# Patient Record
Sex: Female | Born: 1935 | Race: White | Hispanic: No | Smoking: Former smoker
Health system: Southern US, Community
[De-identification: ages and names within clinical notes are randomized; demographics above are authoritative.]

## PROBLEM LIST (undated history)

## (undated) DIAGNOSIS — E785 Hyperlipidemia, unspecified: Secondary | ICD-10-CM

## (undated) DIAGNOSIS — I10 Essential (primary) hypertension: Secondary | ICD-10-CM

## (undated) DIAGNOSIS — E079 Disorder of thyroid, unspecified: Secondary | ICD-10-CM

## (undated) DIAGNOSIS — K5790 Diverticulosis of intestine, part unspecified, without perforation or abscess without bleeding: Secondary | ICD-10-CM

## (undated) DIAGNOSIS — F329 Major depressive disorder, single episode, unspecified: Secondary | ICD-10-CM

## (undated) DIAGNOSIS — M810 Age-related osteoporosis without current pathological fracture: Secondary | ICD-10-CM

## (undated) DIAGNOSIS — I872 Venous insufficiency (chronic) (peripheral): Secondary | ICD-10-CM

## (undated) DIAGNOSIS — F32A Depression, unspecified: Secondary | ICD-10-CM

## (undated) DIAGNOSIS — K219 Gastro-esophageal reflux disease without esophagitis: Secondary | ICD-10-CM

## (undated) DIAGNOSIS — H269 Unspecified cataract: Secondary | ICD-10-CM

## (undated) HISTORY — DX: Depression, unspecified: F32.A

## (undated) HISTORY — PX: OTHER SURGICAL HISTORY: SHX169

## (undated) HISTORY — PX: TUBAL LIGATION: SHX77

## (undated) HISTORY — DX: Age-related osteoporosis without current pathological fracture: M81.0

## (undated) HISTORY — PX: BREAST SURGERY: SHX581

## (undated) HISTORY — PX: ABDOMINAL HYSTERECTOMY: SHX81

## (undated) HISTORY — PX: CHOLECYSTECTOMY: SHX55

## (undated) HISTORY — PX: CATARACT EXTRACTION: SUR2

## (undated) HISTORY — DX: Unspecified cataract: H26.9

## (undated) HISTORY — DX: Diverticulosis of intestine, part unspecified, without perforation or abscess without bleeding: K57.90

## (undated) HISTORY — PX: APPENDECTOMY: SHX54

## (undated) HISTORY — DX: Essential (primary) hypertension: I10

## (undated) HISTORY — DX: Major depressive disorder, single episode, unspecified: F32.9

## (undated) HISTORY — DX: Hyperlipidemia, unspecified: E78.5

## (undated) HISTORY — PX: TONSILECTOMY/ADENOIDECTOMY WITH MYRINGOTOMY: SHX6125

## (undated) HISTORY — DX: Venous insufficiency (chronic) (peripheral): I87.2

## (undated) HISTORY — DX: Gastro-esophageal reflux disease without esophagitis: K21.9

## (undated) HISTORY — DX: Disorder of thyroid, unspecified: E07.9

---

## 2010-07-01 DEATH — deceased

## 2013-12-31 ENCOUNTER — Encounter: Payer: Self-pay | Admitting: *Deleted

## 2014-01-01 ENCOUNTER — Non-Acute Institutional Stay (SKILLED_NURSING_FACILITY): Payer: Medicare Other | Admitting: Internal Medicine

## 2014-01-01 DIAGNOSIS — F329 Major depressive disorder, single episode, unspecified: Secondary | ICD-10-CM

## 2014-01-01 DIAGNOSIS — S2239XA Fracture of one rib, unspecified side, initial encounter for closed fracture: Secondary | ICD-10-CM

## 2014-01-01 DIAGNOSIS — F3289 Other specified depressive episodes: Secondary | ICD-10-CM

## 2014-01-01 DIAGNOSIS — S2231XA Fracture of one rib, right side, initial encounter for closed fracture: Secondary | ICD-10-CM

## 2014-01-01 DIAGNOSIS — I10 Essential (primary) hypertension: Secondary | ICD-10-CM

## 2014-01-01 DIAGNOSIS — K219 Gastro-esophageal reflux disease without esophagitis: Secondary | ICD-10-CM

## 2014-01-01 DIAGNOSIS — Z96649 Presence of unspecified artificial hip joint: Secondary | ICD-10-CM

## 2014-01-01 DIAGNOSIS — F32A Depression, unspecified: Secondary | ICD-10-CM

## 2014-01-02 ENCOUNTER — Encounter: Payer: Self-pay | Admitting: Internal Medicine

## 2014-01-02 DIAGNOSIS — I10 Essential (primary) hypertension: Secondary | ICD-10-CM | POA: Insufficient documentation

## 2014-01-02 DIAGNOSIS — Z96649 Presence of unspecified artificial hip joint: Secondary | ICD-10-CM | POA: Insufficient documentation

## 2014-01-02 DIAGNOSIS — K219 Gastro-esophageal reflux disease without esophagitis: Secondary | ICD-10-CM | POA: Insufficient documentation

## 2014-01-02 DIAGNOSIS — F32A Depression, unspecified: Secondary | ICD-10-CM | POA: Insufficient documentation

## 2014-01-02 DIAGNOSIS — S2231XA Fracture of one rib, right side, initial encounter for closed fracture: Secondary | ICD-10-CM | POA: Insufficient documentation

## 2014-01-02 DIAGNOSIS — F329 Major depressive disorder, single episode, unspecified: Secondary | ICD-10-CM | POA: Insufficient documentation

## 2014-01-02 NOTE — Assessment & Plan Note (Signed)
Tramadol written for her at SNF [facility]

## 2014-01-02 NOTE — Assessment & Plan Note (Addendum)
Pt was not written for any pain meds so I wrote for her tramadol 50 mg q 6 ;PROPHYLAXIS WITH SEQUENTIAL COMPRESSION DEVICES

## 2014-01-02 NOTE — Assessment & Plan Note (Signed)
Continue Lexapro 5 mg daily.

## 2014-01-02 NOTE — Assessment & Plan Note (Signed)
H/o but BP is fine on no meds

## 2014-01-02 NOTE — Assessment & Plan Note (Signed)
Continue omeprazole 

## 2014-01-02 NOTE — Progress Notes (Signed)
MRN: [id number] Name: Ashley Swanson  Sex: female Age: 80 y.o. DOB: 08-Nov-1928  [facility] #: [facility] Facility/Room: 507 Level Of Care: SNF Provider: Merrilee SeashoreALEXANDER, Jazmyne Beauchesne D Emergency Contacts: Extended Emergency Contact Information Primary Emergency Contact: [facility],[name] Address: [address]          [place], [place] [id number] [place] Home Phone: [phone] Mobile Phone: [phone] Relation: Daughter Secondary Emergency Contact: Tera PartridgeBolling,[name]          [place], [place] Home Phone: [phone] Mobile Phone: [phone] Relation: Son  Code Status: FULL  Allergies: Ciprofloxacin; Enablex; Macrobid; Metronidazole and related; Reclast; and Sulfa antibiotics  Chief Complaint  Patient presents with  . nursing home admission    HPI: Patient is 80 y.o. female who is s/p hip fx and rib fx who is admitted for OT/PT.  Past Medical History  Diagnosis Date  . Hyperlipidemia   . Osteoporosis   . Stasis dermatitis   . Diverticulosis   . Cataract   . Thyroid disease     hypothyroid  . GERD (gastroesophageal reflux disease)   . Hypertension   . Depression     Past Surgical History  Procedure Laterality Date  . Cholecystectomy    . Abdominal hysterectomy    . Tubal ligation    . Breast surgery      biopsy  . Tonsilectomy/adenoidectomy with myringotomy    . Cataract extraction    . Appendectomy    . Vertebrolplasty        Medication List       This list is accurate as of: 01/01/14 11:59 PM.  Always use your most recent med list.               amoxicillin 500 MG tablet  Commonly known as:  AMOXIL  Take 500 mg by mouth daily.     brimonidine 0.1 % Soln  Commonly known as:  ALPHAGAN P  Place 1 drop into the right eye 2 (two) times daily.     Cholecalciferol 1000 UNITS tablet  Take 1,000 Units by mouth daily.     dorzolamide 2 % ophthalmic solution  Commonly known as:  TRUSOPT  Place 1 drop into both eyes 2 (two) times daily.     escitalopram 5 MG tablet  Commonly known as:  LEXAPRO  Take 2.5-5 mg by mouth at bedtime.     omeprazole 40 MG capsule  Commonly known as:  PRILOSEC  Take 40 mg by mouth daily.     prednisoLONE acetate 1 % ophthalmic suspension  Commonly known as:  PRED FORTE  Place 1 drop into the right eye 4 (four) times daily.        Meds ordered this encounter  Medications  . dorzolamide (TRUSOPT) 2 % ophthalmic solution    Sig: Place 1 drop into both eyes 2 (two) times daily.     There is no immunization history on file for this patient.  History  Substance Use Topics  . Smoking status: Former Games developermoker  . Smokeless tobacco: Not on file  . Alcohol Use: Yes    Family history is noncontributory    Review of Systems  DATA OBTAINED: from patient GENERAL: Feels well no fevers, fatigue, appetite changes SKIN: No itching, rash or wounds EYES: No eye pain, redness, discharge EARS: No earache, tinnitus, change in hearing NOSE: No congestion, drainage or bleeding  MOUTH/THROAT: No mouth or tooth pain, No sore throat RESPIRATORY: No cough, wheezing, SOB CARDIAC: No chest pain, palpitations,  lower extremity edema  GI: No abdominal pain, No N/V/D or constipation, No heartburn or reflux  GU: No dysuria, frequency or urgency, or incontinence  MUSCULOSKELETAL:c/o rib pain;says that tramadol makes her sleep but she had one 7 hours ago and didn't sleep  NEUROLOGIC: No headache, dizziness or focal weakness PSYCHIATRIC: No overt anxiety or sadness. Sleeps well. No behavior issue.   Filed Vitals:   01/02/14 2220  BP: 108/59  Pulse: 62  Temp: 96.4 F (35.8 C)  Resp: 18    Physical Exam  GENERAL APPEARANCE: Alert, conversant. Appropriately groomed; looks a little uncomfortable  SKIN: No diaphoresis rash HEAD: Normocephalic, atraumatic  EYES: Conjunctiva/lids clear. Pupils round, reactive. EOMs intact.  EARS: External exam WNL, canals clear. Hearing grossly normal.  NOSE: No deformity  or discharge.  MOUTH/THROAT: Lips w/o lesions RESPIRATORY: Breathing is even, unlabored, no splinting Lung sounds are clear   CARDIOVASCULAR: Heart RRR no murmurs, rubs or gallops. No peripheral edema  GASTROINTESTINAL: Abdomen is soft, non-tender, not distended w/ normal bowel sounds GENITOURINARY: Bladder non tender, not distended  MUSCULOSKELETAL: No abnormal joints or musculature NEUROLOGIC: Oriented X3. Cranial nerves 2-12 grossly intact. Moves all extremities no tremor. PSYCHIATRIC: Mood and affect appropriate to situation, no behavioral issues  Patient Active Problem List   Diagnosis Date Noted  . S/P total hip arthroplasty 01/02/2014  . Right rib fracture 01/02/2014  . GERD (gastroesophageal reflux disease)   . Hypertension   . Depression     CBC   3/30   WBC 9.0  7.7/23.7PLT 172   CMP 3/29  142, 3.9, 104, 30, 108, 15/0.68  Ca 8.2  GFR 79   Assessment and Plan  S/P total hip arthroplasty Pt was not written for any pain meds so I wrote for her tramadol 50 mg q 6 ;PROPHYLAXIS WITH SEQUENTIAL COMPRESSION DEVICES  Right rib fracture Tramadol written for her at SNF as none was written for her at [facility].  Hypertension H/o but BP is fine on no meds  GERD (gastroesophageal reflux disease) Continue omeprazole  Depression Continue Lexapro 5 mg daily    Margit Hanks, MD

## 2014-03-12 ENCOUNTER — Encounter: Payer: Self-pay | Admitting: Adult Health

## 2014-10-05 NOTE — Progress Notes (Signed)
This encounter was created in error - please disregard.

## 2015-08-06 ENCOUNTER — Emergency Department (HOSPITAL_COMMUNITY): Payer: Medicare Other

## 2015-08-06 ENCOUNTER — Encounter (HOSPITAL_COMMUNITY): Payer: Self-pay | Admitting: Emergency Medicine

## 2015-08-06 ENCOUNTER — Observation Stay (HOSPITAL_COMMUNITY)
Admission: EM | Admit: 2015-08-06 | Discharge: 2015-08-10 | Disposition: A | Discharge status: Skilled Nursing Facility | Payer: Medicare Other | Attending: Internal Medicine | Admitting: Internal Medicine

## 2015-08-06 DIAGNOSIS — S22060A Wedge compression fracture of T7-T8 vertebra, initial encounter for closed fracture: Secondary | ICD-10-CM | POA: Diagnosis not present

## 2015-08-06 DIAGNOSIS — M810 Age-related osteoporosis without current pathological fracture: Secondary | ICD-10-CM | POA: Insufficient documentation

## 2015-08-06 DIAGNOSIS — E039 Hypothyroidism, unspecified: Secondary | ICD-10-CM | POA: Diagnosis not present

## 2015-08-06 DIAGNOSIS — Z881 Allergy status to other antibiotic agents status: Secondary | ICD-10-CM | POA: Insufficient documentation

## 2015-08-06 DIAGNOSIS — M8580 Other specified disorders of bone density and structure, unspecified site: Secondary | ICD-10-CM | POA: Diagnosis not present

## 2015-08-06 DIAGNOSIS — Y92091 Bathroom in other non-institutional residence as the place of occurrence of the external cause: Secondary | ICD-10-CM | POA: Insufficient documentation

## 2015-08-06 DIAGNOSIS — F329 Major depressive disorder, single episode, unspecified: Secondary | ICD-10-CM | POA: Insufficient documentation

## 2015-08-06 DIAGNOSIS — I6523 Occlusion and stenosis of bilateral carotid arteries: Secondary | ICD-10-CM | POA: Insufficient documentation

## 2015-08-06 DIAGNOSIS — W010XXA Fall on same level from slipping, tripping and stumbling without subsequent striking against object, initial encounter: Secondary | ICD-10-CM | POA: Diagnosis not present

## 2015-08-06 DIAGNOSIS — Z888 Allergy status to other drugs, medicaments and biological substances status: Secondary | ICD-10-CM | POA: Insufficient documentation

## 2015-08-06 DIAGNOSIS — S22000A Wedge compression fracture of unspecified thoracic vertebra, initial encounter for closed fracture: Secondary | ICD-10-CM | POA: Diagnosis present

## 2015-08-06 DIAGNOSIS — E785 Hyperlipidemia, unspecified: Secondary | ICD-10-CM | POA: Diagnosis not present

## 2015-08-06 DIAGNOSIS — Y93E1 Activity, personal bathing and showering: Secondary | ICD-10-CM | POA: Diagnosis not present

## 2015-08-06 DIAGNOSIS — K219 Gastro-esophageal reflux disease without esophagitis: Secondary | ICD-10-CM | POA: Diagnosis not present

## 2015-08-06 DIAGNOSIS — S22080A Wedge compression fracture of T11-T12 vertebra, initial encounter for closed fracture: Secondary | ICD-10-CM | POA: Diagnosis not present

## 2015-08-06 DIAGNOSIS — Z882 Allergy status to sulfonamides status: Secondary | ICD-10-CM | POA: Insufficient documentation

## 2015-08-06 DIAGNOSIS — I10 Essential (primary) hypertension: Secondary | ICD-10-CM | POA: Diagnosis not present

## 2015-08-06 DIAGNOSIS — Z87891 Personal history of nicotine dependence: Secondary | ICD-10-CM | POA: Diagnosis not present

## 2015-08-06 DIAGNOSIS — E860 Dehydration: Secondary | ICD-10-CM | POA: Insufficient documentation

## 2015-08-06 DIAGNOSIS — Y998 Other external cause status: Secondary | ICD-10-CM | POA: Insufficient documentation

## 2015-08-06 DIAGNOSIS — F32A Depression, unspecified: Secondary | ICD-10-CM | POA: Diagnosis present

## 2015-08-06 DIAGNOSIS — S22070A Wedge compression fracture of T9-T10 vertebra, initial encounter for closed fracture: Secondary | ICD-10-CM | POA: Diagnosis not present

## 2015-08-06 DIAGNOSIS — R937 Abnormal findings on diagnostic imaging of other parts of musculoskeletal system: Secondary | ICD-10-CM

## 2015-08-06 LAB — CBC WITH DIFFERENTIAL/PLATELET
Basophils Absolute: 0 10*3/uL (ref 0.0–0.1)
Basophils Relative: 0 %
Eosinophils Absolute: 0.2 10*3/uL (ref 0.0–0.7)
Eosinophils Relative: 2 %
HCT: 41.7 % (ref 36.0–46.0)
Hemoglobin: 12.9 g/dL (ref 12.0–15.0)
Lymphocytes Relative: 19 %
Lymphs Abs: 1.6 10*3/uL (ref 0.7–4.0)
MCH: 29.8 pg (ref 26.0–34.0)
MCHC: 30.9 g/dL (ref 30.0–36.0)
MCV: 96.3 fL (ref 78.0–100.0)
Monocytes Absolute: 1 10*3/uL (ref 0.1–1.0)
Monocytes Relative: 12 %
Neutro Abs: 5.7 10*3/uL (ref 1.7–7.7)
Neutrophils Relative %: 67 %
Platelets: 192 10*3/uL (ref 150–400)
RBC: 4.33 MIL/uL (ref 3.87–5.11)
RDW: 13.7 % (ref 11.5–15.5)
WBC: 8.5 10*3/uL (ref 4.0–10.5)

## 2015-08-06 LAB — BASIC METABOLIC PANEL
Anion gap: 5 (ref 5–15)
BUN: 18 mg/dL (ref 6–20)
CO2: 31 mmol/L (ref 22–32)
Calcium: 9.1 mg/dL (ref 8.9–10.3)
Chloride: 107 mmol/L (ref 101–111)
Creatinine, Ser: 0.81 mg/dL (ref 0.44–1.00)
GFR calc Af Amer: >60 mL/min (ref >60)
GFR calc non Af Amer: >60 mL/min (ref >60)
Glucose, Bld: 107 mg/dL — ABNORMAL HIGH (ref 65–99)
Potassium: 3.7 mmol/L (ref 3.5–5.1)
Sodium: 143 mmol/L (ref 135–145)

## 2015-08-06 MED ORDER — RESOURCE THICKENUP CLEAR PO POWD
ORAL | Status: DC | PRN
  Administered 2015-08-06: 21:00:00 via ORAL
  Filled 2015-08-06 (×2): qty 125

## 2015-08-06 MED ORDER — ONDANSETRON HCL 4 MG/2ML IJ SOLN
4.0000 mg | Freq: Four times a day (QID) | INTRAMUSCULAR | Status: DC | PRN
  Administered 2015-08-07: 4 mg via INTRAVENOUS
  Filled 2015-08-06: qty 2

## 2015-08-06 MED ORDER — KETOROLAC TROMETHAMINE 15 MG/ML IJ SOLN
15.0000 mg | Freq: Once | INTRAMUSCULAR | Status: AC
  Administered 2015-08-07: 15 mg via INTRAVENOUS
  Filled 2015-08-06: qty 1

## 2015-08-06 MED ORDER — HYDROMORPHONE HCL 1 MG/ML IJ SOLN: 1.0000 mg | Freq: Once | INTRAMUSCULAR | Status: DC

## 2015-08-06 MED ORDER — MORPHINE SULFATE (PF) 4 MG/ML IV SOLN
4.0000 mg | Freq: Once | INTRAVENOUS | Status: AC
  Administered 2015-08-06: 4 mg via INTRAVENOUS
  Filled 2015-08-06: qty 1

## 2015-08-06 MED ORDER — FAMOTIDINE IN NACL 20-0.9 MG/50ML-% IV SOLN
20.0000 mg | Freq: Once | INTRAVENOUS | Status: AC
  Administered 2015-08-07: 20 mg via INTRAVENOUS
  Filled 2015-08-06: qty 50

## 2015-08-06 MED ORDER — OXYCODONE-ACETAMINOPHEN 5-325 MG PO TABS
1.0000 | ORAL_TABLET | Freq: Once | ORAL | Status: AC
  Administered 2015-08-06: 1 via ORAL
  Filled 2015-08-06: qty 1

## 2015-08-06 NOTE — ED Notes (Signed)
[facility] last night around 7pm. Pain 10/10 today upon palpation to mid thoracic.

## 2015-08-06 NOTE — ED Notes (Signed)
Patient is resting comfortably. 

## 2015-08-06 NOTE — ED Provider Notes (Signed)
CSN: [id number]     Arrival date & time 08/06/15  1606 History   First MD Initiated Contact with Patient 08/06/15 1618     Chief Complaint  Patient presents with  . Fall  . Back Pain     (Consider location/radiation/quality/duration/timing/severity/associated sxs/prior Treatment) HPI Ashley Swanson is an 81 yo female who presents to the Emergency Department for evaluation of a fall last night. [facility] She states she was in the bathroom last night when she lost balance with her walker and fell backwards on her back and hit her head. She denies loss of consciousness. She notes pain "all over." Endorses headache, back pain, rib pain, shortness of breath, headache. Pt states she is incontinent, but this is not new to her. Denies fever.   Past Medical History  Diagnosis Date  . Hyperlipidemia   . Osteoporosis   . Stasis dermatitis   . Diverticulosis   . Cataract   . Thyroid disease     hypothyroid  . GERD (gastroesophageal reflux disease)   . Hypertension   . Depression    Past Surgical History  Procedure Laterality Date  . Cholecystectomy    . Abdominal hysterectomy    . Tubal ligation    . Breast surgery      biopsy  . Tonsilectomy/adenoidectomy with myringotomy    . Cataract extraction    . Appendectomy    . Vertebrolplasty     Family History  Problem Relation Age of Onset  . Aortic aneurysm Mother   . Cancer Father     mouth  . Diabetes Neg Hx   . Coronary artery disease Neg Hx    Social History  Substance Use Topics  . Smoking status: Former Games developermoker  . Smokeless tobacco: None  . Alcohol Use: Yes   OB History    No data available     Review of Systems  All other systems negative except as documented in the HPI. All pertinent positives and negatives as reviewed in the HPI.  Allergies  Ciprofloxacin; Enablex; Macrobid; Metronidazole and related; Reclast; and Sulfa antibiotics  Home Medications   Prior to Admission medications     Medication Sig Start Date End Date Taking? Authorizing Provider  amoxicillin (AMOXIL) 500 MG tablet Take 500 mg by mouth daily.    Historical Provider, MD  brimonidine (ALPHAGAN P) 0.1 % SOLN Place 1 drop into the right eye 2 (two) times daily.    Historical Provider, MD  Cholecalciferol 1000 UNITS tablet Take 1,000 Units by mouth daily.    Historical Provider, MD  dorzolamide (TRUSOPT) 2 % ophthalmic solution Place 1 drop into both eyes 2 (two) times daily.    Historical Provider, MD  escitalopram (LEXAPRO) 5 MG tablet Take 2.5-5 mg by mouth at bedtime.    Historical Provider, MD  omeprazole (PRILOSEC) 40 MG capsule Take 40 mg by mouth daily.    Historical Provider, MD  prednisoLONE acetate (PRED FORTE) 1 % ophthalmic suspension Place 1 drop into the right eye 4 (four) times daily.    Historical Provider, MD   BP 177/76 mmHg  Pulse 69  Temp(Src) 97.7 F (36.5 C) (Oral)  Resp 16  SpO2 100% Physical Exam  Constitutional:  81 yo female, lying in bed and appears to be in mild discomfort.  HENT:  Head:    Hematoma noted on right occipital lobe with tenderness to palpation.  Eyes: Conjunctivae are normal. Pupils are equal, round, and reactive to light.  Neck:  Limited range of motion.  Cardiovascular: Normal rate and regular rhythm.  Exam reveals no gallop and no friction rub.   No murmur heard. Pulmonary/Chest: Effort normal and breath sounds normal. No respiratory distress. She has no wheezes. She has no rales.  Abdominal: Soft. Bowel sounds are normal. She exhibits no distension and no mass. There is no tenderness. There is no rebound and no guarding.  Musculoskeletal: She exhibits tenderness.       Thoracic back: She exhibits decreased range of motion and tenderness.  Neurological: She is alert.  Skin: Skin is warm and dry.    ED Course  Procedures (including critical care time) Labs Review Labs Reviewed  BASIC METABOLIC PANEL - Abnormal; Notable for the following:     Glucose, Bld 107 (*)    All other components within normal limits  BASIC METABOLIC PANEL - Abnormal; Notable for the following:    BUN 24 (*)    Calcium 8.8 (*)    All other components within normal limits  BASIC METABOLIC PANEL - Abnormal; Notable for the following:    Glucose, Bld 103 (*)    Calcium 8.6 (*)    Anion gap 4 (*)    All other components within normal limits  BASIC METABOLIC PANEL - Abnormal; Notable for the following:    Calcium 8.7 (*)    All other components within normal limits  CBC WITH DIFFERENTIAL/PLATELET  CBC  CBC    Imaging Review No results found. I have personally reviewed and evaluated these images and lab results as part of my medical decision-making.   EKG Interpretation None      6:25PM Pt states she continues to be in pain. No improvement.  9:05PM No change in pain. Pain med is currently being administered by nurse.  Patient is admitted to the hospital for further evaluation and care.   Charlestine Night, PA-C 08/12/15 0865  Lyndal Pulley, MD 08/12/15 [phone]

## 2015-08-06 NOTE — ED Notes (Signed)
Bed: [id number] Expected date: 08/06/15 Expected time: 4:02 PM Means of arrival: Ambulance Comments: Fall

## 2015-08-06 NOTE — ED Notes (Signed)
Patient transported to CT 

## 2015-08-07 DIAGNOSIS — E039 Hypothyroidism, unspecified: Secondary | ICD-10-CM | POA: Insufficient documentation

## 2015-08-07 DIAGNOSIS — S22000A Wedge compression fracture of unspecified thoracic vertebra, initial encounter for closed fracture: Secondary | ICD-10-CM | POA: Diagnosis not present

## 2015-08-07 MED ORDER — ALBUTEROL SULFATE (2.5 MG/3ML) 0.083% IN NEBU: 2.5000 mg | INHALATION_SOLUTION | RESPIRATORY_TRACT | Status: DC | PRN

## 2015-08-07 MED ORDER — MORPHINE SULFATE (PF) 2 MG/ML IV SOLN: 2.0000 mg | INTRAVENOUS | Status: DC | PRN

## 2015-08-07 MED ORDER — ENOXAPARIN SODIUM 40 MG/0.4ML ~~LOC~~ SOLN
40.0000 mg | SUBCUTANEOUS | Status: DC
  Administered 2015-08-07 – 2015-08-10 (×4): 40 mg via SUBCUTANEOUS
  Filled 2015-08-07 (×4): qty 0.4

## 2015-08-07 MED ORDER — PREDNISOLONE ACETATE 1 % OP SUSP
1.0000 [drp] | Freq: Four times a day (QID) | OPHTHALMIC | Status: DC
  Administered 2015-08-07 – 2015-08-10 (×15): 1 [drp] via OPHTHALMIC
  Filled 2015-08-07: qty 1

## 2015-08-07 MED ORDER — CALCIUM CARBONATE 1250 (500 CA) MG PO TABS
1.0000 | ORAL_TABLET | Freq: Every day | ORAL | Status: DC
  Administered 2015-08-07 – 2015-08-10 (×4): 500 mg via ORAL
  Filled 2015-08-07 (×6): qty 1

## 2015-08-07 MED ORDER — MELATONIN 10 MG PO CAPS: 10.0000 mg | ORAL_CAPSULE | Freq: Every day | ORAL | Status: DC

## 2015-08-07 MED ORDER — ALUM & MAG HYDROXIDE-SIMETH 200-200-20 MG/5ML PO SUSP: 15.0000 mL | ORAL | Status: DC | PRN

## 2015-08-07 MED ORDER — VITAMIN B-12 1000 MCG PO TABS
1000.0000 ug | ORAL_TABLET | Freq: Every day | ORAL | Status: DC
  Administered 2015-08-07 – 2015-08-10 (×4): 1000 ug via ORAL
  Filled 2015-08-07 (×4): qty 1

## 2015-08-07 MED ORDER — HYDROCODONE-ACETAMINOPHEN 5-325 MG PO TABS: 1.0000 | ORAL_TABLET | Freq: Four times a day (QID) | ORAL | Status: DC | PRN

## 2015-08-07 MED ORDER — ACETAMINOPHEN 325 MG PO TABS: 650.0000 mg | ORAL_TABLET | Freq: Four times a day (QID) | ORAL | Status: DC | PRN

## 2015-08-07 MED ORDER — POTASSIUM CHLORIDE IN NACL 20-0.45 MEQ/L-% IV SOLN
INTRAVENOUS | Status: DC
  Administered 2015-08-07: 16:00:00 via INTRAVENOUS
  Administered 2015-08-07: 50 mL/h via INTRAVENOUS
  Administered 2015-08-08 – 2015-08-10 (×3): via INTRAVENOUS
  Filled 2015-08-07 (×7): qty 1000

## 2015-08-07 MED ORDER — BISACODYL 10 MG RE SUPP: 10.0000 mg | RECTAL | Status: DC | PRN

## 2015-08-07 MED ORDER — SENNOSIDES-DOCUSATE SODIUM 8.6-50 MG PO TABS
1.0000 | ORAL_TABLET | Freq: Two times a day (BID) | ORAL | Status: DC
  Administered 2015-08-07 – 2015-08-10 (×7): 1 via ORAL
  Filled 2015-08-07 (×9): qty 1

## 2015-08-07 MED ORDER — ESCITALOPRAM OXALATE 20 MG PO TABS
40.0000 mg | ORAL_TABLET | Freq: Every day | ORAL | Status: DC
  Administered 2015-08-07 – 2015-08-10 (×4): 40 mg via ORAL
  Filled 2015-08-07 (×4): qty 2

## 2015-08-07 MED ORDER — BRIMONIDINE TARTRATE 0.2 % OP SOLN
1.0000 [drp] | Freq: Three times a day (TID) | OPHTHALMIC | Status: DC
  Administered 2015-08-07 – 2015-08-10 (×11): 1 [drp] via OPHTHALMIC
  Filled 2015-08-07: qty 5

## 2015-08-07 MED ORDER — DORZOLAMIDE HCL-TIMOLOL MAL 2-0.5 % OP SOLN
1.0000 [drp] | Freq: Two times a day (BID) | OPHTHALMIC | Status: DC
  Administered 2015-08-07 – 2015-08-10 (×7): 1 [drp] via OPHTHALMIC
  Filled 2015-08-07: qty 10

## 2015-08-07 MED ORDER — FAMOTIDINE 40 MG PO TABS
40.0000 mg | ORAL_TABLET | Freq: Every day | ORAL | Status: DC
  Administered 2015-08-07 – 2015-08-09 (×3): 40 mg via ORAL
  Filled 2015-08-07 (×5): qty 1

## 2015-08-07 MED ORDER — DOCUSATE SODIUM 100 MG PO CAPS: 200.0000 mg | ORAL_CAPSULE | Freq: Every morning | ORAL | Status: DC

## 2015-08-07 MED ORDER — BENZOCAINE 10 % MT GEL
1.0000 "application " | OROMUCOSAL | Status: DC | PRN
  Filled 2015-08-07: qty 9.4

## 2015-08-07 MED ORDER — AMOXICILLIN 500 MG PO CAPS
1000.0000 mg | ORAL_CAPSULE | Freq: Every day | ORAL | Status: DC
  Administered 2015-08-07 – 2015-08-08 (×2): 1000 mg via ORAL
  Filled 2015-08-07 (×2): qty 2

## 2015-08-07 MED ORDER — MORPHINE SULFATE (PF) 2 MG/ML IV SOLN
1.0000 mg | INTRAVENOUS | Status: DC | PRN
  Administered 2015-08-07 – 2015-08-08 (×5): 1 mg via INTRAVENOUS
  Filled 2015-08-07 (×5): qty 1

## 2015-08-07 NOTE — Progress Notes (Signed)
PHARMACIST - PHYSICIAN ORDER COMMUNICATION  CONCERNING: P&T Medication Policy on Herbal Medications  DESCRIPTION:  This patient's order for:  Melatonin  has been noted.  This product(s) is classified as an "herbal" or natural product. Due to a lack of definitive safety studies or FDA approval, nonstandard manufacturing practices, plus the potential risk of unknown drug-drug interactions while on inpatient medications, the Pharmacy and Therapeutics Committee does not permit the use of "herbal" or natural products of this type within .   ACTION TAKEN: The pharmacy department is unable to verify this order at this time and your patient has been informed of this safety policy. Please reevaluate patient's clinical condition at discharge and address if the herbal or natural product(s) should be resumed at that time.  Keyon Winnick, PharmD  

## 2015-08-07 NOTE — Progress Notes (Signed)
Pt seen and examined, admitted this am 86/F with Osteoporosis, GERD, Hypothyroidism, HTN from home admitted with fall, neck and back pain Found to have Compression fractures of T8, T10 and T11. T10 and T11 are chronic. Nondisplaced fracture of the left inferior articulating facet of C6. There is a sclerotic margin on either side of the fracture suggesting a subacute or chronic injury -called and d/w Neurosurgery Dr.Ditty, he felt that C 6 facet fracture is chronic, since has sclerotic rim and hence C collar not recommended, Supportive care recommended -T8 fracture also discussed, he felt that Kyphoplasty would not be an option, due to severe OA and collapse -add vicodin for pain control, Pt eval -left msg for daughter Merla RichesLisa  Savonna Birchmeier, MD [phone]

## 2015-08-07 NOTE — Progress Notes (Addendum)
Pt arrived to floor via stretcher.Alert orient to self,place ,situation at present.Dtr Ashley InchesLisa [name] RN called-States pt has memory deficit and dementia.Dr Ashley Swanson sent text to notify of pt arrival to floor.Orders released and Epic Downtime is upcoming.VSS,denies nay pain at present. Ashley HeadlandBeverly, Ashley Swanson D

## 2015-08-07 NOTE — H&P (Signed)
[facility] History and Physical  Ashley Swanson RUE:[id number] DOB: 1929/05/31 DOA: 08/06/2015  Referring physician: Charlestine Night, PA-C PCP: [name], MD   Chief Complaint: Back Pain.  HPI: Ashley Swanson is a 81 y.o. female  with a past medical history of hyperlipidemia, osteoporosis, diverticulosis, GERD, hypothyroidism, hypertension, depression who comes to the emergency department after losing her balance in her bathroom and falling backwards hitting her head. She denies loss of consciousness, but complains of headache, back pain and chest wall pain. She denies precordial chest pain, dizziness, palpitations, dyspnea, nausea or emesis.  Workup in the emergency department reveals a compression fracture of T8 and chronic to subacute fracture of C-6, which have been suggested by radiology to obtain an MRI for further evaluation.  When seen in the emergency department, the patient was sleepy secondary to analgesics, but but stated that her pain was very minimal at that time.  Review of Systems:  Unable to fully review due to sedation.  Past Medical History  Diagnosis Date  . Hyperlipidemia   . Osteoporosis   . Stasis dermatitis   . Diverticulosis   . Cataract   . Thyroid disease     hypothyroid  . GERD (gastroesophageal reflux disease)   . Hypertension   . Depression    Past Surgical History  Procedure Laterality Date  . Cholecystectomy    . Abdominal hysterectomy    . Tubal ligation    . Breast surgery      biopsy  . Tonsilectomy/adenoidectomy with myringotomy    . Cataract extraction    . Appendectomy    . Vertebrolplasty     Social History:  reports that she has quit smoking. She does not have any smokeless tobacco history on file. She reports that she drinks alcohol. Her drug history is not on file.  Allergies  Allergen Reactions  . Ciprofloxacin     unknown  . Enablex [Darifenacin Hydrobromide Er]     unknown  . Macrobid [employer] Macro]     unknown  . Metronidazole And Related     unknown  . Reclast [Zoledronic Acid]     unknown  . Sulfa Antibiotics     unknown    Family History  Problem Relation Age of Onset  . Aortic aneurysm Mother   . Cancer Father     mouth  . Diabetes Neg Hx   . Coronary artery disease Neg Hx     Prior to Admission medications   Medication Sig Start Date End Date Taking? Authorizing Provider  acetaminophen (TYLENOL) 325 MG tablet Take 650 mg by mouth every 8 (eight) hours as needed for moderate pain.   Yes Historical Provider, MD  albuterol (PROVENTIL) (2.5 MG/3ML) 0.083% nebulizer solution Take 2.5 mg by nebulization every 4 (four) hours as needed for wheezing or shortness of breath.   Yes Historical Provider, MD  aluminum-magnesium hydroxide-simethicone (MAALOX) 200-200-20 MG/5ML SUSP Take 15 mLs by mouth every 4 (four) hours as needed (reflux).   Yes Historical Provider, MD  amoxicillin (AMOXIL) 500 MG tablet Take 1,000 mg by mouth daily.    Yes Historical Provider, MD  benzocaine (ANBESOL) 10 % mucosal gel Use as directed 1 application in the mouth or throat as needed for mouth pain.   Yes Historical Provider, MD  bisacodyl (DULCOLAX) 10 MG suppository Place 10 mg rectally as needed for moderate constipation.   Yes Historical Provider, MD  brimonidine (ALPHAGAN) 0.2 % ophthalmic solution Place 1 drop into the right eye  3 (three) times daily.   Yes Historical Provider, MD  calcium carbonate (OS-CAL - DOSED IN MG OF ELEMENTAL CALCIUM) 1250 (500 CA) MG tablet Take 1 tablet by mouth daily with breakfast.   Yes Historical Provider, MD  docusate sodium (COLACE) 100 MG capsule Take 200 mg by mouth every morning.   Yes Historical Provider, MD  dorzolamide-timolol (COSOPT) 22.3-6.8 MG/ML ophthalmic solution Place 1 drop into both eyes 2 (two) times daily.   Yes Historical Provider, MD  escitalopram (LEXAPRO) 20 MG tablet Take 40 mg by mouth daily.   Yes Historical Provider, MD    Melatonin 10 MG CAPS Take 10 mg by mouth at bedtime.   Yes Historical Provider, MD  Multiple Vitamin (MULTIVITAMIN WITH MINERALS) TABS tablet Take 1 tablet by mouth daily.   Yes Historical Provider, MD  Omega-3 Fatty Acids (FISH OIL) 1000 MG CAPS Take 1 capsule by mouth daily.   Yes Historical Provider, MD  OXYGEN Inhale 2 L/min into the lungs as needed (low o2 levels).   Yes Historical Provider, MD  prednisoLONE acetate (PRED FORTE) 1 % ophthalmic suspension Place 1 drop into the right eye 4 (four) times daily.   Yes Historical Provider, MD  ranitidine (ZANTAC) 300 MG tablet Take 300 mg by mouth at bedtime.   Yes Historical Provider, MD  traMADol (ULTRAM) 50 MG tablet Take 50 mg by mouth every 6 (six) hours as needed for moderate pain.   Yes Historical Provider, MD  vitamin B-12 (CYANOCOBALAMIN) 1000 MCG tablet Take 1,000 mcg by mouth daily.   Yes Historical Provider, MD   Physical Exam: Filed Vitals:   08/06/15 2126 08/06/15 2356 08/07/15 0057 08/07/15 0103  BP: 155/72 140/75 152/63   Pulse: 64 65 66   Temp:  97.6 F (36.4 C) 97.3 F (36.3 C)   TempSrc:  Oral Oral   Resp: Height:    4' 7.91" (1.42 m)  Weight:   45.7 kg (100 lb 12 oz)   SpO2: 98% 94% 98%     Wt Readings from Last 3 Encounters:  08/07/15 45.7 kg (100 lb 12 oz)  03/12/14 50.894 kg (112 lb 3.2 oz)    General:  Appears calm and comfortable Eyes: PERRL, normal lids, irises & conjunctiva ENT: grossly normal hearing, lips & tongue Neck: no LAD, masses or thyromegaly Cardiovascular: RRR, no m/r/g. No LE edema. Telemetry: SR, no arrhythmias  Respiratory: CTA bilaterally, no w/r/r. Normal respiratory effort. Abdomen: soft, ntnd Skin: no rash or induration seen on limited exam Musculoskeletal: Decreased range of motion and tenderness on thoracic spine. Psychiatric: A sleepy, but arousable. speech fluent and appropriate Neurologic: grossly non-focal.          Labs on Admission:  Basic Metabolic  Panel:  Recent Labs Lab 08/06/15 2209  NA 143  K 3.7  CL 107  CO2 31  GLUCOSE 107*  BUN 18  CREATININE 0.81  CALCIUM 9.1   CBC:  Recent Labs Lab 08/06/15 2209  WBC 8.5  NEUTROABS 5.7  HGB 12.9  HCT 41.7  MCV 96.3  PLT 192    Radiological Exams on Admission: Dg Chest 2 View  08/06/2015  CLINICAL DATA:  Diffuse chest pain following a fall in the bathroom last night. EXAM: CHEST  2 VIEW COMPARISON:  None. FINDINGS: Mildly enlarged cardiac silhouette. Tortuous aorta. Clear lungs with mild linear density at the medial left lung base. Diffuse osteopenia. Multiple thoracic vertebral compression deformities with kyphoplasty material. No acute fracture or  pneumothorax identified. IMPRESSION: 1. No acute abnormality. 2. Mild cardiomegaly. 3. Mild linear scarring at the left lung base. Electronically Signed   By: Beckie SaltsSteven  Reid M.D.   On: 08/06/2015 18:16   Dg Thoracic Spine 2 View  08/06/2015  CLINICAL DATA:  Pt states she fell and hit her head in the bathroom last night at assisting living facility. Pt denies LOC. Pt c/o of diffused chest pain and tspine pain. EXAM: THORACIC SPINE 2 VIEWS COMPARISON:  None. FINDINGS: Multiple thoracic compression fractures. There is vertebroplasty cement at the T10 and T11 levels, stabilizing a moderate fracture of T10 and a mild compression fracture of T11. Some of the cement pints extruded anterior to the T10 vertebra, which appears chronic. New there is a moderate to severe compression fracture T8. This is unchanged. The bones are diffusely demineralized. There is mild degenerate spurring along the mid and lower thoracic spine. IMPRESSION: 1. Compression fractures of T8, T10 and T11. T10 and T11 are chronic, with the T8 fracture being of unclear chronicity. Electronically Signed   By: Amie Portlandavid  Ormond M.D.   On: 08/06/2015 18:18   Ct Head Wo Contrast  08/06/2015  CLINICAL DATA:  Status post fall, neck pain EXAM: CT HEAD WITHOUT CONTRAST CT CERVICAL SPINE  WITHOUT CONTRAST TECHNIQUE: Multidetector CT imaging of the head and cervical spine was performed following the standard protocol without intravenous contrast. Multiplanar CT image reconstructions of the cervical spine were also generated. COMPARISON:  None. FINDINGS: CT HEAD FINDINGS There is no evidence of mass effect, midline shift, or extra-axial fluid collections. There is no evidence of a space-occupying lesion or intracranial hemorrhage. There is no evidence of a cortical-based area of acute infarction. There is generalized cerebral atrophy. There is periventricular white matter low attenuation likely secondary to microangiopathy. The ventricles and sulci are appropriate for the patient's age. The basal cisterns are patent. Visualized portions of the orbits are unremarkable. The visualized portions of the paranasal sinuses and mastoid air cells are unremarkable. Cerebrovascular atherosclerotic calcifications are noted. The osseous structures are unremarkable. CT CERVICAL SPINE FINDINGS The alignment is anatomic. The vertebral body heights are maintained. There is a nondisplaced fracture of the left inferior articulating facet of C6. There is a sclerotic margin on either side of the fracture suggesting a subacute or chronic injury. There is no other fracture or subluxation of the cervical spine. There is no static listhesis. The prevertebral soft tissues are normal. The intraspinal soft tissues are not fully imaged on this examination due to poor soft tissue contrast, but there is no gross soft tissue abnormality. The disc spaces are maintained. There is bilateral facet arthropathy at C3-4, C4-5, C5-6 and C6-7. The visualized portions of the lung apices demonstrate no focal abnormality. There is bilateral carotid artery atherosclerosis. IMPRESSION: 1. No acute intracranial pathology. 2. Nondisplaced fracture of the left inferior articulating facet of C6. There is a sclerotic margin on either side of the  fracture suggesting a subacute or chronic injury. There is no other fracture or subluxation of the cervical spine. If there is further clinical concern, recommend MRI. There is surgical follow-up is recommended. Electronically Signed   By: Elige KoHetal  Patel   On: 08/06/2015 17:46   Ct Cervical Spine Wo Contrast  08/06/2015  CLINICAL DATA:  Status post fall, neck pain EXAM: CT HEAD WITHOUT CONTRAST CT CERVICAL SPINE WITHOUT CONTRAST TECHNIQUE: Multidetector CT imaging of the head and cervical spine was performed following the standard protocol without intravenous contrast. Multiplanar CT image reconstructions  of the cervical spine were also generated. COMPARISON:  None. FINDINGS: CT HEAD FINDINGS There is no evidence of mass effect, midline shift, or extra-axial fluid collections. There is no evidence of a space-occupying lesion or intracranial hemorrhage. There is no evidence of a cortical-based area of acute infarction. There is generalized cerebral atrophy. There is periventricular white matter low attenuation likely secondary to microangiopathy. The ventricles and sulci are appropriate for the patient's age. The basal cisterns are patent. Visualized portions of the orbits are unremarkable. The visualized portions of the paranasal sinuses and mastoid air cells are unremarkable. Cerebrovascular atherosclerotic calcifications are noted. The osseous structures are unremarkable. CT CERVICAL SPINE FINDINGS The alignment is anatomic. The vertebral body heights are maintained. There is a nondisplaced fracture of the left inferior articulating facet of C6. There is a sclerotic margin on either side of the fracture suggesting a subacute or chronic injury. There is no other fracture or subluxation of the cervical spine. There is no static listhesis. The prevertebral soft tissues are normal. The intraspinal soft tissues are not fully imaged on this examination due to poor soft tissue contrast, but there is no gross soft tissue  abnormality. The disc spaces are maintained. There is bilateral facet arthropathy at C3-4, C4-5, C5-6 and C6-7. The visualized portions of the lung apices demonstrate no focal abnormality. There is bilateral carotid artery atherosclerosis. IMPRESSION: 1. No acute intracranial pathology. 2. Nondisplaced fracture of the left inferior articulating facet of C6. There is a sclerotic margin on either side of the fracture suggesting a subacute or chronic injury. There is no other fracture or subluxation of the cervical spine. If there is further clinical concern, recommend MRI. There is surgical follow-up is recommended. Electronically Signed   By: Elige Ko   On: 08/06/2015 17:46       Assessment/Plan Principal problem: Thoracic compression fracture (HCC) Admit for pain management. Evaluation by physical therapy when the pain is manageable. Patient to follow-up with neurosurgery for evaluation for possible kyphoplasty as an outpatient.  Active Problems:   C-spine subacute/chronic fracture. MRI C-spine ordered.   GERD (gastroesophageal reflux disease) Continue proton pump inhibitor therapy.    Hypertension Not on an antihypertensive at home. Blood pressure may be elevated over her baseline due to pain and stress. Continue low-sodium diet.    Depression Continue Lexapro 40 mg by mouth daily.     Code Status: Full code. DVT Prophylaxis: Lovenox SQ. Family CommunicationWeldon Inches Daughter [phone]  [phone]  Disposition Plan: Admit for pain management and MRI of C-spine in the morning.  Time spent:   Bobette Mo [facility] Pager [phone].

## 2015-08-08 DIAGNOSIS — K219 Gastro-esophageal reflux disease without esophagitis: Secondary | ICD-10-CM | POA: Diagnosis not present

## 2015-08-08 DIAGNOSIS — F329 Major depressive disorder, single episode, unspecified: Secondary | ICD-10-CM | POA: Diagnosis not present

## 2015-08-08 DIAGNOSIS — I10 Essential (primary) hypertension: Secondary | ICD-10-CM | POA: Diagnosis not present

## 2015-08-08 DIAGNOSIS — S22000A Wedge compression fracture of unspecified thoracic vertebra, initial encounter for closed fracture: Secondary | ICD-10-CM | POA: Diagnosis not present

## 2015-08-08 LAB — BASIC METABOLIC PANEL
ANION GAP: 5 (ref 5–15)
BUN: 24 mg/dL — AB (ref 6–20)
CHLORIDE: 104 mmol/L (ref 101–111)
CO2: 30 mmol/L (ref 22–32)
Calcium: 8.8 mg/dL — ABNORMAL LOW (ref 8.9–10.3)
Creatinine, Ser: 0.74 mg/dL (ref 0.44–1.00)
GFR calc Af Amer: >60 mL/min (ref >60)
GFR calc non Af Amer: >60 mL/min (ref >60)
GLUCOSE: 96 mg/dL (ref 65–99)
POTASSIUM: 4.4 mmol/L (ref 3.5–5.1)
Sodium: 139 mmol/L (ref 135–145)

## 2015-08-08 LAB — CBC
HCT: 42.9 % (ref 36.0–46.0)
HEMOGLOBIN: 13 g/dL (ref 12.0–15.0)
MCH: 30.2 pg (ref 26.0–34.0)
MCHC: 30.3 g/dL (ref 30.0–36.0)
MCV: 99.8 fL (ref 78.0–100.0)
Platelets: 194 10*3/uL (ref 150–400)
RBC: 4.3 MIL/uL (ref 3.87–5.11)
RDW: 13.8 % (ref 11.5–15.5)
WBC: 7.5 10*3/uL (ref 4.0–10.5)

## 2015-08-08 MED ORDER — HYDROCODONE-ACETAMINOPHEN 5-325 MG PO TABS: 1.0000 | ORAL_TABLET | Freq: Four times a day (QID) | ORAL | Status: DC | PRN

## 2015-08-08 MED ORDER — OXYCODONE HCL 5 MG PO TABS
5.0000 mg | ORAL_TABLET | ORAL | Status: DC | PRN
  Administered 2015-08-09 – 2015-08-10 (×3): 5 mg via ORAL
  Filled 2015-08-08 (×3): qty 1

## 2015-08-08 MED ORDER — ACETAMINOPHEN 160 MG/5ML PO SOLN
500.0000 mg | Freq: Four times a day (QID) | ORAL | Status: DC
  Administered 2015-08-08 – 2015-08-10 (×9): 500 mg via ORAL
  Filled 2015-08-08 (×13): qty 20

## 2015-08-08 NOTE — NC FL2 (Deleted)
[place] MEDICAID FL2 LEVEL OF CARE SCREENING TOOL     IDENTIFICATION  Patient Name: Ashley Swanson Birthdate: Jan 10, 1929 Sex: female Admission Date (Current Location): 08/06/2015  [facility] and [place] Number: Producer, television/film/video and Address:  [facility],  [address] [place] [address]      Provider Number: [id number]  Attending Physician Name and Address:  Zannie Cove, MD  Relative Name and Phone Number:       Current Level of Care: Hospital Recommended Level of Care: Skilled Nursing Facility Prior Approval Number:    Date Approved/Denied:   PASRR Number:    Discharge Plan: SNF    Current Diagnoses: Patient Active Problem List   Diagnosis Date Noted  . Hypothyroidism 08/07/2015  . Thoracic compression fracture (HCC) 08/06/2015  . S/P total hip arthroplasty 01/02/2014  . Right rib fracture 01/02/2014  . GERD (gastroesophageal reflux disease)   . Hypertension   . Depression     Orientation ACTIVITIES/SOCIAL BLADDER RESPIRATION    Self, Time  Active Incontinent O2 (As needed) (2l)  BEHAVIORAL SYMPTOMS/MOOD NEUROLOGICAL BOWEL NUTRITION STATUS      Incontinent Diet (DYS 1)  PHYSICIAN VISITS COMMUNICATION OF NEEDS Height & Weight Skin    Verbally  (139.7 cm) 100 lbs. Normal          AMBULATORY STATUS RESPIRATION    Assist extensive O2 (As needed) (2l)      Personal Care Assistance Level of Assistance  Bathing, Dressing Bathing Assistance: Limited assistance   Dressing Assistance: Limited assistance      Functional Limitations Info                SPECIAL CARE FACTORS FREQUENCY  PT (By licensed PT), OT (By licensed OT)     PT Frequency: 5 OT Frequency: 5           Additional Factors Info  Code Status Code Status Info: Full Code             Current Medications (08/08/2015): Current Facility-Administered Medications  Medication Dose Route Frequency Provider Last Rate Last Dose  . 0.45 % NaCl with KCl 20  mEq / L infusion   Intravenous Continuous Zannie Cove, MD 75 mL/hr at 08/08/15 1141    . acetaminophen (TYLENOL) solution 500 mg  500 mg Oral Q6H Zannie Cove, MD   500 mg at 08/08/15 1200  . albuterol (PROVENTIL) (2.5 MG/3ML) 0.083% nebulizer solution 2.5 mg  2.5 mg Nebulization Q4H PRN Bobette Mo, MD      . alum & mag hydroxide-simeth (MAALOX/MYLANTA) 200-200-20 MG/5ML suspension 15 mL  15 mL Oral Q4H PRN Bobette Mo, MD      . benzocaine (ORAJEL) 10 % mucosal gel 1 application  1 application Mouth/Throat PRN Bobette Mo, MD      . bisacodyl (DULCOLAX) suppository 10 mg  10 mg Rectal PRN Bobette Mo, MD      . brimonidine (ALPHAGAN) 0.2 % ophthalmic solution 1 drop  1 drop Right Eye TID Bobette Mo, MD   1 drop at 08/08/15 1600  . calcium carbonate (OS-CAL - dosed in mg of elemental calcium) tablet 500 mg of elemental calcium  1 tablet Oral Q breakfast Bobette Mo, MD   500 mg of elemental calcium at 08/08/15 0903  . dorzolamide-timolol (COSOPT) 22.3-6.8 MG/ML ophthalmic solution 1 drop  1 drop Both Eyes BID Bobette Mo, MD   1 drop at 08/08/15 1000  . enoxaparin (LOVENOX) injection 40  mg  40 mg Subcutaneous Q24H Bobette Moavid Manuel Ortiz, MD   40 mg at 08/08/15 [id number]  . escitalopram (LEXAPRO) tablet 40 mg  40 mg Oral Daily Bobette Moavid Manuel Ortiz, MD   40 mg at 08/08/15 0903  . famotidine (PEPCID) tablet 40 mg  40 mg Oral QHS Bobette Moavid Manuel Ortiz, MD   40 mg at 08/07/15 2204  . morphine 2 MG/ML injection 1 mg  1 mg Intravenous Q3H PRN Zannie CovePreetha Joseph, MD   1 mg at 08/08/15 0601  . ondansetron (ZOFRAN) injection 4 mg  4 mg Intravenous Q6H PRN Bobette Moavid Manuel Ortiz, MD   4 mg at 08/07/15 0005  . oxyCODONE (Oxy IR/ROXICODONE) immediate release tablet 5 mg  5 mg Oral Q4H PRN Zannie CovePreetha Joseph, MD      . prednisoLONE acetate (PRED FORTE) 1 % ophthalmic suspension 1 drop  1 drop Right Eye QID Bobette Moavid Manuel Ortiz, MD   1 drop at 08/08/15 1400  . RESOURCE THICKENUP CLEAR    Oral PRN Charlestine Nighthristopher Lawyer, PA-C      . senna-docusate (Senokot-S) tablet 1 tablet  1 tablet Oral BID Zannie CovePreetha Joseph, MD   1 tablet at 08/08/15 0903  . vitamin B-12 (CYANOCOBALAMIN) tablet 1,000 mcg  1,000 mcg Oral Daily Bobette Moavid Manuel Ortiz, MD   1,000 mcg at 08/08/15 [id number]   Do not use this list as official medication orders. Please verify with discharge summary.  Discharge Medications:   Medication List    ASK your doctor about these medications        acetaminophen 325 MG tablet  Commonly known as:  TYLENOL  Take 650 mg by mouth every 8 (eight) hours as needed for moderate pain.     albuterol (2.5 MG/3ML) 0.083% nebulizer solution  Commonly known as:  PROVENTIL  Take 2.5 mg by nebulization every 4 (four) hours as needed for wheezing or shortness of breath.     aluminum-magnesium hydroxide-simethicone 200-200-20 MG/5ML Susp  Commonly known as:  MAALOX  Take 15 mLs by mouth every 4 (four) hours as needed (reflux).     amoxicillin 500 MG tablet  Commonly known as:  AMOXIL  Take 1,000 mg by mouth daily.     ANBESOL 10 % mucosal gel  Generic drug:  benzocaine  Use as directed 1 application in the mouth or throat as needed for mouth pain.     bisacodyl 10 MG suppository  Commonly known as:  DULCOLAX  Place 10 mg rectally as needed for moderate constipation.     brimonidine 0.2 % ophthalmic solution  Commonly known as:  ALPHAGAN  Place 1 drop into the right eye 3 (three) times daily.     calcium carbonate 1250 (500 CA) MG tablet  Commonly known as:  OS-CAL - dosed in mg of elemental calcium  Take 1 tablet by mouth daily with breakfast.     docusate sodium 100 MG capsule  Commonly known as:  COLACE  Take 200 mg by mouth every morning.     dorzolamide-timolol 22.3-6.8 MG/ML ophthalmic solution  Commonly known as:  COSOPT  Place 1 drop into both eyes 2 (two) times daily.     escitalopram 20 MG tablet  Commonly known as:  LEXAPRO  Take 40 mg by mouth daily.     Fish Oil  1000 MG Caps  Take 1 capsule by mouth daily.     Melatonin 10 MG Caps  Take 10 mg by mouth at bedtime.     multivitamin with minerals Tabs tablet  Take 1 tablet by mouth daily.     OXYGEN  Inhale 2 L/min into the lungs as needed (low o2 levels).     prednisoLONE acetate 1 % ophthalmic suspension  Commonly known as:  PRED FORTE  Place 1 drop into the right eye 4 (four) times daily.     ranitidine 300 MG tablet  Commonly known as:  ZANTAC  Take 300 mg by mouth at bedtime.     traMADol 50 MG tablet  Commonly known as:  ULTRAM  Take 50 mg by mouth every 6 (six) hours as needed for moderate pain.     vitamin B-12 1000 MCG tablet  Commonly known as:  CYANOCOBALAMIN  Take 1,000 mcg by mouth daily.        Relevant Imaging Results:  Relevant Lab Results:  Recent Labs    Additional Information SSN [id number]  Liliana Cline, LCSW

## 2015-08-08 NOTE — Progress Notes (Signed)
PT Cancellation Note  Patient Details Name: Ashley Swanson MRN: [id number] DOB: 04-Oct-1928   Cancelled Treatment:    Reason Eval/Treat Not Completed: Pain limiting ability to participate   [facility] HospitalWILLIAMS,Hristopher Missildine 08/08/2015, 12:16 PM

## 2015-08-08 NOTE — Progress Notes (Signed)
[facility] PROGRESS NOTE  Ashley Swanson ZOX:[id number] DOB: [birth year] DOA: 08/06/2015 PCP: [name], MD  Assessment/Plan: 1. Fall/Back and Neck pain -T8 compression fracture new and old  T10/T11 fractures -Neck pain-Nondisplaced fracture of the left inferior articulating facet of C6 with sclerotic margin on either side of the fracture suggesting a subacute or chronic injury -called and d/w Neurosurgery Dr.Ditty, he reviewed scans and felt that C 6 facet fracture is chronic, since has sclerotic rim and hence C collar not recommended, Supportive care recommended -for T8 fracture, he felt that Kyphoplasty would not be an option, due to severe OA and collapse -called and updated Daughter -still in severe pain, continue morphine PRN, add Oxycodone, schedule tylenol, she cannot tolerate NSAIDs -Pt eval  2. Severe OA  3. HTN -not on meds, BP higher this am could be due to pain  4. Dehydration -continue IVF today  DVT proph: lovenox  Code Status: Full Code Family Communication:none at bedside, called and d/w daughter Misty Stanley Disposition Plan: Home vs SNF   Consultants: D/w Neurosurgery Dr.Ditty  HPI/Subjective: Has neck and back pain  Objective: Filed Vitals:   08/08/15 0552  BP: 165/59  Pulse: 71  Temp: 98.1 F (36.7 C)  Resp: 18    Intake/Output Summary (Last 24 hours) at 08/08/15 1117 Last data filed at 08/08/15 0915  Gross per 24 hour  Intake    420 ml  Output      0 ml  Net    420 ml   Filed Weights   08/07/15 0057  Weight: 45.7 kg (100 lb 12 oz)    Exam:   General:  AAOx3, frail cachectic, uncomfortable  Cardiovascular: S1S2/RRR  Respiratory: CTAB  Abdomen: soft, Nt, BS present  Musculoskeletal: no edema c/c  Data Reviewed: Basic Metabolic Panel:  Recent Labs Lab 08/06/15 2209 08/08/15 0532  NA 143 139  K 3.7 4.4  CL 107 104  CO2 31 30  GLUCOSE 107* 96  BUN 18 24*  CREATININE 0.81 0.74  CALCIUM 9.1 8.8*   Liver Function  Tests: No results for input(s): AST, ALT, ALKPHOS, BILITOT, PROT, ALBUMIN in the last 168 hours. No results for input(s): LIPASE, AMYLASE in the last 168 hours. No results for input(s): AMMONIA in the last 168 hours. CBC:  Recent Labs Lab 08/06/15 2209 08/08/15 0532  WBC 8.5 7.5  NEUTROABS 5.7  --   HGB 12.9 13.0  HCT 41.7 42.9  MCV 96.3 99.8  PLT 192 194   Cardiac Enzymes: No results for input(s): CKTOTAL, CKMB, CKMBINDEX, TROPONINI in the last 168 hours. BNP (last 3 results) No results for input(s): BNP in the last 8760 hours.  ProBNP (last 3 results) No results for input(s): PROBNP in the last 8760 hours.  CBG: No results for input(s): GLUCAP in the last 168 hours.  No results found for this or any previous visit (from the past 240 hour(s)).   Studies: Dg Chest 2 View  08/06/2015  CLINICAL DATA:  Diffuse chest pain following a fall in the bathroom last night. EXAM: CHEST  2 VIEW COMPARISON:  None. FINDINGS: Mildly enlarged cardiac silhouette. Tortuous aorta. Clear lungs with mild linear density at the medial left lung base. Diffuse osteopenia. Multiple thoracic vertebral compression deformities with kyphoplasty material. No acute fracture or pneumothorax identified. IMPRESSION: 1. No acute abnormality. 2. Mild cardiomegaly. 3. Mild linear scarring at the left lung base. Electronically Signed   By: Beckie Salts M.D.   On: 08/06/2015 18:16   Dg Thoracic Spine  2 View  08/06/2015  CLINICAL DATA:  Pt states she fell and hit her head in the bathroom last night at assisting living facility. Pt denies LOC. Pt c/o of diffused chest pain and tspine pain. EXAM: THORACIC SPINE 2 VIEWS COMPARISON:  None. FINDINGS: Multiple thoracic compression fractures. There is vertebroplasty cement at the T10 and T11 levels, stabilizing a moderate fracture of T10 and a mild compression fracture of T11. Some of the cement pints extruded anterior to the T10 vertebra, which appears chronic. New there is a  moderate to severe compression fracture T8. This is unchanged. The bones are diffusely demineralized. There is mild degenerate spurring along the mid and lower thoracic spine. IMPRESSION: 1. Compression fractures of T8, T10 and T11. T10 and T11 are chronic, with the T8 fracture being of unclear chronicity. Electronically Signed   By: Amie Portland M.D.   On: 08/06/2015 18:18   Ct Head Wo Contrast  08/06/2015  CLINICAL DATA:  Status post fall, neck pain EXAM: CT HEAD WITHOUT CONTRAST CT CERVICAL SPINE WITHOUT CONTRAST TECHNIQUE: Multidetector CT imaging of the head and cervical spine was performed following the standard protocol without intravenous contrast. Multiplanar CT image reconstructions of the cervical spine were also generated. COMPARISON:  None. FINDINGS: CT HEAD FINDINGS There is no evidence of mass effect, midline shift, or extra-axial fluid collections. There is no evidence of a space-occupying lesion or intracranial hemorrhage. There is no evidence of a cortical-based area of acute infarction. There is generalized cerebral atrophy. There is periventricular white matter low attenuation likely secondary to microangiopathy. The ventricles and sulci are appropriate for the patient's age. The basal cisterns are patent. Visualized portions of the orbits are unremarkable. The visualized portions of the paranasal sinuses and mastoid air cells are unremarkable. Cerebrovascular atherosclerotic calcifications are noted. The osseous structures are unremarkable. CT CERVICAL SPINE FINDINGS The alignment is anatomic. The vertebral body heights are maintained. There is a nondisplaced fracture of the left inferior articulating facet of C6. There is a sclerotic margin on either side of the fracture suggesting a subacute or chronic injury. There is no other fracture or subluxation of the cervical spine. There is no static listhesis. The prevertebral soft tissues are normal. The intraspinal soft tissues are not fully  imaged on this examination due to poor soft tissue contrast, but there is no gross soft tissue abnormality. The disc spaces are maintained. There is bilateral facet arthropathy at C3-4, C4-5, C5-6 and C6-7. The visualized portions of the lung apices demonstrate no focal abnormality. There is bilateral carotid artery atherosclerosis. IMPRESSION: 1. No acute intracranial pathology. 2. Nondisplaced fracture of the left inferior articulating facet of C6. There is a sclerotic margin on either side of the fracture suggesting a subacute or chronic injury. There is no other fracture or subluxation of the cervical spine. If there is further clinical concern, recommend MRI. There is surgical follow-up is recommended. Electronically Signed   By: Elige Ko   On: 08/06/2015 17:46   Ct Cervical Spine Wo Contrast  08/06/2015  CLINICAL DATA:  Status post fall, neck pain EXAM: CT HEAD WITHOUT CONTRAST CT CERVICAL SPINE WITHOUT CONTRAST TECHNIQUE: Multidetector CT imaging of the head and cervical spine was performed following the standard protocol without intravenous contrast. Multiplanar CT image reconstructions of the cervical spine were also generated. COMPARISON:  None. FINDINGS: CT HEAD FINDINGS There is no evidence of mass effect, midline shift, or extra-axial fluid collections. There is no evidence of a space-occupying lesion or intracranial hemorrhage.  There is no evidence of a cortical-based area of acute infarction. There is generalized cerebral atrophy. There is periventricular white matter low attenuation likely secondary to microangiopathy. The ventricles and sulci are appropriate for the patient's age. The basal cisterns are patent. Visualized portions of the orbits are unremarkable. The visualized portions of the paranasal sinuses and mastoid air cells are unremarkable. Cerebrovascular atherosclerotic calcifications are noted. The osseous structures are unremarkable. CT CERVICAL SPINE FINDINGS The alignment is  anatomic. The vertebral body heights are maintained. There is a nondisplaced fracture of the left inferior articulating facet of C6. There is a sclerotic margin on either side of the fracture suggesting a subacute or chronic injury. There is no other fracture or subluxation of the cervical spine. There is no static listhesis. The prevertebral soft tissues are normal. The intraspinal soft tissues are not fully imaged on this examination due to poor soft tissue contrast, but there is no gross soft tissue abnormality. The disc spaces are maintained. There is bilateral facet arthropathy at C3-4, C4-5, C5-6 and C6-7. The visualized portions of the lung apices demonstrate no focal abnormality. There is bilateral carotid artery atherosclerosis. IMPRESSION: 1. No acute intracranial pathology. 2. Nondisplaced fracture of the left inferior articulating facet of C6. There is a sclerotic margin on either side of the fracture suggesting a subacute or chronic injury. There is no other fracture or subluxation of the cervical spine. If there is further clinical concern, recommend MRI. There is surgical follow-up is recommended. Electronically Signed   By: Elige KoHetal  Patel   On: 08/06/2015 17:46    Scheduled Meds: . acetaminophen (TYLENOL) oral liquid 160 mg/5 mL  500 mg Oral Q6H  . amoxicillin  1,000 mg Oral Daily  . brimonidine  1 drop Right Eye TID  . calcium carbonate  1 tablet Oral Q breakfast  . dorzolamide-timolol  1 drop Both Eyes BID  . enoxaparin (LOVENOX) injection  40 mg Subcutaneous Q24H  . escitalopram  40 mg Oral Daily  . famotidine  40 mg Oral QHS  . prednisoLONE acetate  1 drop Right Eye QID  . senna-docusate  1 tablet Oral BID  . vitamin B-12  1,000 mcg Oral Daily   Continuous Infusions: . 0.45 % NaCl with KCl 20 mEq / L 50 mL/hr at 08/07/15 1621   Antibiotics Given (last 72 hours)    Date/Time Action Medication Dose   08/07/15 1042 Given   amoxicillin (AMOXIL) capsule 1,000 mg 1,000 mg    08/08/15 [id number] Given   amoxicillin (AMOXIL) capsule 1,000 mg 1,000 mg      Principal Problem:   Thoracic compression fracture [facility](HCC) Active Problems:   GERD (gastroesophageal reflux disease)   Hypertension   Depression    Time spent: 35min   [facility] Chester Medical CenterJOSEPH,Crystol Walpole  [facility] Pager [phone]. If 7PM-7AM, please contact night-coverage at [email or web address], password [email or web address] 08/08/2015, 11:17 AM  LOS: 2 days

## 2015-08-08 NOTE — Progress Notes (Signed)
CSW went and spoke with patient regarding discharge planning. Patient was alert and oriented. Patient informed CSW [facility] and would like to return to facility. Fl2 complete. Full CSW assessment to follow.   Fernande BoydenJoyce Eldor Conaway, [facility] Jamaica PlainCSWA Clinical Social Worker [facility] [phone]

## 2015-08-08 NOTE — Evaluation (Signed)
Occupational Therapy Evaluation Patient Details Name: Ashley Swanson MRN: [id number] DOB: 09-30-1929 Today's Date: 08/08/2015    History of Present Illness Ashley Swanson is a 81 y.o. female with a past medical history of hyperlipidemia, osteoporosis, diverticulosis, GERD, hypothyroidism, hypertension, depression who comes to the emergency department after losing her balance in her bathroom and falling backwards hitting her head. She denies loss of consciousness, but complains of headache, back pain and chest wall pain. She denies precordial chest pain, dizziness, palpitations, dyspnea, nausea or emesis.   Clinical Impression   Pt admitted with fall resulting in compression fracture (thoracic). Pt currently with functional limitations due to the deficits listed below (see OT Problem List).  Pt will benefit from skilled OT to increase their safety and independence with ADL and functional mobility for ADL to facilitate discharge to venue listed below.      Follow Up Recommendations  SNF    Equipment Recommendations  None recommended by OT       Precautions / Restrictions Restrictions Weight Bearing Restrictions: Yes      Mobility Bed Mobility Overal bed mobility: Needs Assistance Bed Mobility: Supine to Sit;Rolling Rolling: Total assist   Supine to sit: Total assist     General bed mobility comments: RN present and Aed OT  Transfers                 General transfer comment: not performed due to pain    Balance                                            ADL Overall ADL's : Needs assistance/impaired Eating/Feeding: Minimal assistance;Bed level Eating/Feeding Details (indicate cue type and reason): drinking                                   General ADL Comments: pt in too much pain to perform ADL activity other than drinking     Vision     Perception     Praxis      Pertinent Vitals/Pain Pain Assessment: Faces Pain  Score: 10-Worst pain ever Pain Descriptors / Indicators: Moaning Pain Intervention(s): Monitored during session;Repositioned;Limited activity within patient's tolerance;RN gave pain meds during session     Hand Dominance     Extremity/Trunk Assessment Upper Extremity Assessment Upper Extremity Assessment: Generalized weakness           Communication     Cognition Arousal/Alertness: Awake/alert Behavior During Therapy: Restless Overall Cognitive Status: No family/caregiver present to determine baseline cognitive functioning                     General Comments   pain very limiting during OT eval            Home Living Family/patient expects to be discharged to:: Assisted living                                        Prior Functioning/Environment               OT Diagnosis: Generalized weakness;Acute pain   OT Problem List: Decreased strength;Decreased activity tolerance;Pain   OT Treatment/Interventions: Self-care/ADL training;DME and/or AE instruction;Patient/family education    OT Goals(Current goals can be  found in the care plan section) Acute Rehab OT Goals Patient Stated Goal: decrease pain Time For Goal Achievement: 08/22/15 Potential to Achieve Goals: Fair  OT Frequency: Min 2X/week   Barriers to D/C:  pain             End of Session    Activity Tolerance: Patient limited by pain Patient left: in bed;with call bell/phone within reach;with bed alarm set   Time: 1120-1133 OT Time Calculation (min): 13 min Charges:  OT General Charges $OT Visit: 1 Procedure G-Codes: OT G-codes **NOT FOR INPATIENT CLASS** Functional Assessment Tool Used: clinical observation Functional Limitation: Self care Self Care Current Status (Z6109(G8987): At least 80 percent but less than 100 percent impaired, limited or restricted Self Care Goal Status (U0454(G8988): At least 20 percent but less than 40 percent impaired, limited or restricted  Zylon Creamer,  Metro KungLorraine D 08/08/2015, 1:13 PM

## 2015-08-09 DIAGNOSIS — I10 Essential (primary) hypertension: Secondary | ICD-10-CM | POA: Diagnosis not present

## 2015-08-09 DIAGNOSIS — F329 Major depressive disorder, single episode, unspecified: Secondary | ICD-10-CM | POA: Diagnosis not present

## 2015-08-09 DIAGNOSIS — S22000A Wedge compression fracture of unspecified thoracic vertebra, initial encounter for closed fracture: Secondary | ICD-10-CM | POA: Diagnosis not present

## 2015-08-09 LAB — BASIC METABOLIC PANEL
Anion gap: 4 — ABNORMAL LOW (ref 5–15)
BUN: 16 mg/dL (ref 6–20)
CHLORIDE: 105 mmol/L (ref 101–111)
CO2: 30 mmol/L (ref 22–32)
CREATININE: 0.61 mg/dL (ref 0.44–1.00)
Calcium: 8.6 mg/dL — ABNORMAL LOW (ref 8.9–10.3)
GFR calc Af Amer: >60 mL/min (ref >60)
GFR calc non Af Amer: >60 mL/min (ref >60)
GLUCOSE: 103 mg/dL — AB (ref 65–99)
POTASSIUM: 4.5 mmol/L (ref 3.5–5.1)
Sodium: 139 mmol/L (ref 135–145)

## 2015-08-09 MED ORDER — MORPHINE SULFATE (PF) 2 MG/ML IV SOLN: 1.0000 mg | INTRAVENOUS | Status: DC | PRN

## 2015-08-09 MED ORDER — FENTANYL 25 MCG/HR TD PT72
25.0000 ug | MEDICATED_PATCH | TRANSDERMAL | Status: DC
  Administered 2015-08-09: 25 ug via TRANSDERMAL
  Filled 2015-08-09: qty 1

## 2015-08-09 MED ORDER — MORPHINE SULFATE (PF) 2 MG/ML IV SOLN: 2.0000 mg | INTRAVENOUS | Status: DC | PRN

## 2015-08-09 NOTE — Clinical Social Work Note (Signed)
Clinical Social Work Assessment  Patient Details  Name: Ashley Swanson MRN: [id number] Date of Birth: Dec 31, 1928  Date of referral:  08/08/15               Reason for consult:  Facility Placement                Permission sought to share information with:  Case Manager, Magazine features editoracility Contact Representative, Family Supports Permission granted to share information::  Yes, Verbal Permission Granted  Name::     Weldon InchesLisa [name]   Agency::     Relationship::  Daughter  Contact Information:  [phone]  Housing/Transportation Living arrangements for the past 2 months:  Skilled Nursing Facility Source of Information:  Patient Patient Interpreter Needed:  None Criminal Activity/Legal Involvement Pertinent to Current Situation/Hospitalization:  No - Comment as needed Significant Relationships:  Adult Children Lives with:  Facility Resident Do you feel safe going back to the place where you live?  Yes Need for family participation in patient care:  Yes (Comment)  Care giving concerns:  Daughter denies any concerns at this time patient returning back to facility.    Social Worker assessment / plan:  CS[facility] CSW went to speak with patient regarding discharge planning. CSW introduced self and acknowledged the patient. Patient is alert and oriented to person. Patient was calm and cooperative with CSW assessment. Patient provided permission to speak with daughter regarding disposition. Patient and daughter would [facility] for [facility] CSW informed patient and family that CSW will contact facility to explore any concerns regarding the patient returning. CSW will then fax over requested patient information. CSW to complete FL2 for MD signature.    Employment status:  Disabled (Comment on whether or not currently receiving Disability) Insurance information:  Managed Medicare PT Recommendations:  Skilled Nursing  Facility Information / Referral to community resources:     Patient/Family's Response to care: Patient stated, "I would like to return back to the facility because the people are nice and there's a lot of activities to do there".   Patient/Family's Understanding of and Emotional Response to Diagnosis, Current Treatment, and Prognosis:  Patient and daughter are aware and understanding of current treatment and prognosis. Both patient and daughter is hopeful for patient to return back to facility.   Emotional Assessment Appearance:  Appears stated age Attitude/Demeanor/Rapport:  Crying (Calm and cooperative ) Affect (typically observed):  Accepting, Appropriate, Calm Orientation:  Oriented to Self, Oriented to Place, Oriented to Situation Alcohol / Substance use:  Not Applicable Psych involvement (Current and /or in the community):  No (Comment)  Discharge Needs  Concerns to be addressed:  Discharge Planning Concerns Readmission within the last 30 days:  No Current discharge risk:  Physical Impairment Barriers to Discharge:  Barriers Resolved   Loleta DickerJoyce S Narya Beavin, LCSW 08/09/2015, 9:48 AM

## 2015-08-09 NOTE — Progress Notes (Signed)
CSW spoke with patient's daughter Ashley Swanson to inform her of update from MD. Patient is not medically stable and ready to go today. D[facility]. CSW was provided permission to fax clinicals out. CSW provided  daughter with a list of SNF's and informed her of current bed offers. Daughter stated she will need time today to look at facilities and will follow up with CSW. Daughter aware of possible discharge tomorrow.  CSW will continue to follow and provide support while in hospital.   Fernande BoydenJoyce Shariq Puig, [facility] CenterCSWA Clinical Social Worker [facility] [phone]

## 2015-08-09 NOTE — NC FL2 (Signed)
[place] MEDICAID FL2 LEVEL OF CARE SCREENING TOOL     IDENTIFICATION  Patient Name: Ashley Swanson Birthdate: [birth year] Sex: female Admission Date (Current Location): 08/06/2015  [facility] [place]   Facility and Address:  [facility],  [address], [place] [id number]      Provider Number: [id number]  Attending Physician Name and Address:  Zannie CovePreetha Joseph, MD  Relative Name and Phone Number:       Current Level of Care: Hospital Recommended Level of Care: Skilled Nursing Facility Prior Approval Number:    Date Approved/Denied:   PASRR Number: [id number]  Discharge Plan: SNF    Current Diagnoses: Patient Active Problem List   Diagnosis Date Noted  . Hypothyroidism 08/07/2015  . Thoracic compression fracture (HCC) 08/06/2015  . S/P total hip arthroplasty 01/02/2014  . Right rib fracture 01/02/2014  . GERD (gastroesophageal reflux disease)   . Hypertension   . Depression     Orientation ACTIVITIES/SOCIAL BLADDER RESPIRATION    Self, Time  Active Incontinent O2 (As needed) (2l)  BEHAVIORAL SYMPTOMS/MOOD NEUROLOGICAL BOWEL NUTRITION STATUS      Incontinent Diet (DYS 1)  PHYSICIAN VISITS COMMUNICATION OF NEEDS Height & Weight Skin    Verbally 4\' 7"  (139.7 cm) 100 lbs. Normal          AMBULATORY STATUS RESPIRATION    Assist extensive O2 (As needed) (2l)      Personal Care Assistance Level of Assistance  Bathing, Dressing Bathing Assistance: Limited assistance   Dressing Assistance: Limited assistance      Functional Limitations Info                SPECIAL CARE FACTORS FREQUENCY  PT (By licensed PT), OT (By licensed OT)     PT Frequency: 5 OT Frequency: 5           Additional Factors Info  Code Status Code Status Info: Full Code             Current Medications (08/09/2015): Current Facility-Administered Medications  Medication Dose Route Frequency Provider Last Rate Last Dose  . 0.45 % NaCl with  KCl 20 mEq / L infusion   Intravenous Continuous Zannie CovePreetha Joseph, MD 50 mL/hr at 08/09/15 1017    . acetaminophen (TYLENOL) solution 500 mg  500 mg Oral Q6H Zannie CovePreetha Joseph, MD   500 mg at 08/09/15 1208  . albuterol (PROVENTIL) (2.5 MG/3ML) 0.083% nebulizer solution 2.5 mg  2.5 mg Nebulization Q4H PRN Bobette Moavid Manuel Ortiz, MD      . alum & mag hydroxide-simeth (MAALOX/MYLANTA) 200-200-20 MG/5ML suspension 15 mL  15 mL Oral Q4H PRN Bobette Moavid Manuel Ortiz, MD      . benzocaine (ORAJEL) 10 % mucosal gel 1 application  1 application Mouth/Throat PRN Bobette Moavid Manuel Ortiz, MD      . bisacodyl (DULCOLAX) suppository 10 mg  10 mg Rectal PRN Bobette Moavid Manuel Ortiz, MD      . brimonidine (ALPHAGAN) 0.2 % ophthalmic solution 1 drop  1 drop Right Eye TID Bobette Moavid Manuel Ortiz, MD   1 drop at 08/09/15 1031  . calcium carbonate (OS-CAL - dosed in mg of elemental calcium) tablet 500 mg of elemental calcium  1 tablet Oral Q breakfast Bobette Moavid Manuel Ortiz, MD   500 mg of elemental calcium at 08/09/15 1031  . dorzolamide-timolol (COSOPT) 22.3-6.8 MG/ML ophthalmic solution 1 drop  1 drop Both Eyes BID Bobette Moavid Manuel Ortiz, MD   1 drop at 08/09/15 1031  . enoxaparin (LOVENOX) injection 40 mg  40 mg Subcutaneous Q24H Bobette Mo, MD   40 mg at 08/09/15 1031  . escitalopram (LEXAPRO) tablet 40 mg  40 mg Oral Daily Bobette Mo, MD   40 mg at 08/09/15 1030  . famotidine (PEPCID) tablet 40 mg  40 mg Oral QHS Bobette Mo, MD   40 mg at 08/08/15 2154  . fentaNYL (DURAGESIC - dosed mcg/hr) patch 25 mcg  25 mcg Transdermal Q72H Zannie Cove, MD   25 mcg at 08/09/15 1040  . morphine 2 MG/ML injection 1-2 mg  1-2 mg Intravenous Q3H PRN Zannie Cove, MD      . ondansetron [facility]) injection 4 mg  4 mg Intravenous Q6H PRN Bobette Mo, MD   4 mg at 08/07/15 0005  . oxyCODONE (Oxy IR/ROXICODONE) immediate release tablet 5 mg  5 mg Oral Q4H PRN Zannie Cove, MD   5 mg at 08/09/15 5621  . prednisoLONE acetate (PRED FORTE) 1  % ophthalmic suspension 1 drop  1 drop Right Eye QID Bobette Mo, MD   1 drop at 08/09/15 1031  . RESOURCE THICKENUP CLEAR   Oral PRN Charlestine Night, PA-C      . senna-docusate (Senokot-S) tablet 1 tablet  1 tablet Oral BID Zannie Cove, MD   1 tablet at 08/09/15 1030  . vitamin B-12 (CYANOCOBALAMIN) tablet 1,000 mcg  1,000 mcg Oral Daily Bobette Mo, MD   1,000 mcg at 08/09/15 1030   Do not use this list as official medication orders. Please verify with discharge summary.  Discharge Medications:   Medication List    ASK your doctor about these medications        acetaminophen 325 MG tablet  Commonly known as:  TYLENOL  Take 650 mg by mouth every 8 (eight) hours as needed for moderate pain.     albuterol (2.5 MG/3ML) 0.083% nebulizer solution  Commonly known as:  PROVENTIL  Take 2.5 mg by nebulization every 4 (four) hours as needed for wheezing or shortness of breath.     aluminum-magnesium hydroxide-simethicone 200-200-20 MG/5ML Susp  Commonly known as:  MAALOX  Take 15 mLs by mouth every 4 (four) hours as needed (reflux).     amoxicillin 500 MG tablet  Commonly known as:  AMOXIL  Take 1,000 mg by mouth daily.     ANBESOL 10 % mucosal gel  Generic drug:  benzocaine  Use as directed 1 application in the mouth or throat as needed for mouth pain.     bisacodyl 10 MG suppository  Commonly known as:  DULCOLAX  Place 10 mg rectally as needed for moderate constipation.     brimonidine 0.2 % ophthalmic solution  Commonly known as:  ALPHAGAN  Place 1 drop into the right eye 3 (three) times daily.     calcium carbonate 1250 (500 CA) MG tablet  Commonly known as:  OS-CAL - dosed in mg of elemental calcium  Take 1 tablet by mouth daily with breakfast.     docusate sodium 100 MG capsule  Commonly known as:  COLACE  Take 200 mg by mouth every morning.     dorzolamide-timolol 22.3-6.8 MG/ML ophthalmic solution  Commonly known as:  COSOPT  Place 1 drop into  both eyes 2 (two) times daily.     escitalopram 20 MG tablet  Commonly known as:  LEXAPRO  Take 40 mg by mouth daily.     Fish Oil 1000 MG Caps  Take 1 capsule by mouth daily.  Melatonin 10 MG Caps  Take 10 mg by mouth at bedtime.     multivitamin with minerals Tabs tablet  Take 1 tablet by mouth daily.     OXYGEN  Inhale 2 L/min into the lungs as needed (low o2 levels).     prednisoLONE acetate 1 % ophthalmic suspension  Commonly known as:  PRED FORTE  Place 1 drop into the right eye 4 (four) times daily.     ranitidine 300 MG tablet  Commonly known as:  ZANTAC  Take 300 mg by mouth at bedtime.     traMADol 50 MG tablet  Commonly known as:  ULTRAM  Take 50 mg by mouth every 6 (six) hours as needed for moderate pain.     vitamin B-12 1000 MCG tablet  Commonly known as:  CYANOCOBALAMIN  Take 1,000 mcg by mouth daily.        Relevant Imaging Results:  Relevant Lab Results:  Recent Labs    Additional Information SSN [id number]  Loleta Dicker, LCSW

## 2015-08-09 NOTE — Progress Notes (Signed)
[facility] to inform of patient discharge. Facility informed CSW that no bed was held for patient and they will not be able to accept her back. CSW informed daughter of information provided. CSW to initiate placement search for SNF. PT eval requested. CSW will continue to follow.   Fernande BoydenJoyce Kedron Uno, [facility] Clinical Social Worker [facility] [phone]

## 2015-08-09 NOTE — Progress Notes (Signed)
[facility] PROGRESS NOTE  Ashley Swanson:[id number] DOB: November 03, 1928 DOA: 08/06/2015 PCP: [name], MD  Assessment/Plan: 1. Fall/Back and Neck pain -T8 compression fracture new and old  T10/T11 fractures -Neck pain-Nondisplaced fracture of the left inferior articulating facet of C6 with sclerotic margin on either side of the fracture suggesting a subacute or chronic injury -called and d/w Neurosurgery Dr.Ditty, he reviewed scans and felt that C 6 facet fracture is not new, since has sclerotic rim and hence C collar not recommended, Supportive care recommended -for acute T8 fracture, he felt that Kyphoplasty would not be an option, due to severe OA and collapse -called and updated Daughter -still in severe pain, continue morphine PRN-increase dose , continue Oxycodone PRN, schedule tylenol, she cannot tolerate NSAIDs -will add low dose Fentanyl patch due to severity of pain -Pt eval pending  2. Severe OA  3. HTN -not on meds, BP higher this am could be due to pain  4. Dehydration -cut down IVF  DVT proph: lovenox  Code Status: Full Code Family Communication:none at bedside, called and d/w daughter Georg Ruddle Disposition Plan: Back to [facility] when pain better    Consultants: D/w Neurosurgery Dr.Ditty  HPI/Subjective: Still reports severe neck and back pain, IV morphine helping some  Objective: Filed Vitals:   08/09/15 0456  BP: 151/74  Pulse: 63  Temp: 98.3 F (36.8 C)  Resp: 20    Intake/Output Summary (Last 24 hours) at 08/09/15 1015 Last data filed at 08/09/15 0815  Gross per 24 hour  Intake 3497.92 ml  Output      0 ml  Net 3497.92 ml   Filed Weights   08/07/15 0057  Weight: 45.7 kg (100 lb 12 oz)    Exam:   General:  AAOx3, frail cachectic, uncomfortable, tearful  Cardiovascular: S1S2/RRR  Respiratory: CTAB  Abdomen: soft, Nt, BS present  Musculoskeletal: no edema c/c  Data Reviewed: Basic Metabolic  Panel:  Recent Labs Lab 08/06/15 2209 08/08/15 0532 08/09/15 0552  NA 143 139 139  K 3.7 4.4 4.5  CL 107 104 105  CO2 GLUCOSE 107* 96 103*  BUN 18 24* 16  CREATININE 0.81 0.74 0.61  CALCIUM 9.1 8.8* 8.6*   Liver Function Tests: No results for input(s): AST, ALT, ALKPHOS, BILITOT, PROT, ALBUMIN in the last 168 hours. No results for input(s): LIPASE, AMYLASE in the last 168 hours. No results for input(s): AMMONIA in the last 168 hours. CBC:  Recent Labs Lab 08/06/15 2209 08/08/15 0532  WBC 8.5 7.5  NEUTROABS 5.7  --   HGB 12.9 13.0  HCT 41.7 42.9  MCV 96.3 99.8  PLT 192 194   Cardiac Enzymes: No results for input(s): CKTOTAL, CKMB, CKMBINDEX, TROPONINI in the last 168 hours. BNP (last 3 results) No results for input(s): BNP in the last 8760 hours.  ProBNP (last 3 results) No results for input(s): PROBNP in the last 8760 hours.  CBG: No results for input(s): GLUCAP in the last 168 hours.  No results found for this or any previous visit (from the past 240 hour(s)).   Studies: No results found.  Scheduled Meds: . acetaminophen (TYLENOL) oral liquid 160 mg/5 mL  500 mg Oral Q6H  . brimonidine  1 drop Right Eye TID  . calcium carbonate  1 tablet Oral Q breakfast  . dorzolamide-timolol  1 drop Both Eyes BID  . enoxaparin (LOVENOX) injection  40 mg Subcutaneous Q24H  . escitalopram  40 mg Oral Daily  .  famotidine  40 mg Oral QHS  . prednisoLONE acetate  1 drop Right Eye QID  . senna-docusate  1 tablet Oral BID  . vitamin B-12  1,000 mcg Oral Daily   Continuous Infusions: . 0.45 % NaCl with KCl 20 mEq / L 75 mL/hr at 08/08/15 1141   Antibiotics Given (last 72 hours)    Date/Time Action Medication Dose   08/07/15 1042 Given   amoxicillin (AMOXIL) capsule 1,000 mg 1,000 mg   08/08/15 [id number] Given   amoxicillin (AMOXIL) capsule 1,000 mg 1,000 mg      Principal Problem:   Thoracic compression fracture [facility](HCC) Active Problems:   GERD  (gastroesophageal reflux disease)   Hypertension   Depression    Time spent: 35min   [facility] HospitalJOSEPH,Chimere Klingensmith  [facility] Pager [phone]. If 7PM-7AM, please contact night-coverage at [email or web address], password [email or web address] 08/09/2015, 10:15 AM  LOS: 3 days

## 2015-08-10 DIAGNOSIS — S22000A Wedge compression fracture of unspecified thoracic vertebra, initial encounter for closed fracture: Secondary | ICD-10-CM

## 2015-08-10 DIAGNOSIS — F329 Major depressive disorder, single episode, unspecified: Secondary | ICD-10-CM | POA: Diagnosis not present

## 2015-08-10 DIAGNOSIS — I10 Essential (primary) hypertension: Secondary | ICD-10-CM

## 2015-08-10 LAB — BASIC METABOLIC PANEL
ANION GAP: 6 (ref 5–15)
BUN: 16 mg/dL (ref 6–20)
CHLORIDE: 103 mmol/L (ref 101–111)
CO2: 30 mmol/L (ref 22–32)
Calcium: 8.7 mg/dL — ABNORMAL LOW (ref 8.9–10.3)
Creatinine, Ser: 0.64 mg/dL (ref 0.44–1.00)
GFR calc non Af Amer: >60 mL/min (ref >60)
Glucose, Bld: 96 mg/dL (ref 65–99)
Potassium: 4.3 mmol/L (ref 3.5–5.1)
Sodium: 139 mmol/L (ref 135–145)

## 2015-08-10 LAB — CBC
HEMATOCRIT: 39.3 % (ref 36.0–46.0)
HEMOGLOBIN: 12.1 g/dL (ref 12.0–15.0)
MCH: 29.9 pg (ref 26.0–34.0)
MCHC: 30.8 g/dL (ref 30.0–36.0)
MCV: 97 fL (ref 78.0–100.0)
Platelets: 191 10*3/uL (ref 150–400)
RBC: 4.05 MIL/uL (ref 3.87–5.11)
RDW: 13.4 % (ref 11.5–15.5)
WBC: 5.4 10*3/uL (ref 4.0–10.5)

## 2015-08-10 MED ORDER — TRAMADOL HCL 50 MG PO TABS: 50.0000 mg | ORAL_TABLET | Freq: Four times a day (QID) | ORAL | Status: DC | PRN

## 2015-08-10 MED ORDER — OXYCODONE HCL 5 MG PO TABS: 5.0000 mg | ORAL_TABLET | ORAL | Status: DC | PRN

## 2015-08-10 MED ORDER — FENTANYL 25 MCG/HR TD PT72: 25.0000 ug | MEDICATED_PATCH | TRANSDERMAL | Status: DC

## 2015-08-10 NOTE — Progress Notes (Signed)
Discharge instructions accompanied pt, left the unit in stable condition via ambulance to SNF. 

## 2015-08-10 NOTE — Progress Notes (Signed)
Occupational Therapy Treatment Patient Details Name: Leigh Blas MRN: [id number] DOB: Sep 06, 1929 Today's Date: 08/10/2015    History of present illness Virda Betters is a 81 y.o. female with a past medical history of hyperlipidemia, osteoporosis, diverticulosis, GERD, hypothyroidism, hypertension, depression who comes to the emergency department after losing her balance in her bathroom and falling backwards hitting her head. She denies loss of consciousness, but complains of headache, back pain and chest wall pain. She denies precordial chest pain, dizziness, palpitations, dyspnea, nausea or emesis.   OT comments  Pt needed 2 person A to get to chair.  Pt with posterior lean in standing   Follow Up Recommendations  SNF    Equipment Recommendations       Recommendations for Other Services      Precautions / Restrictions Precautions Precautions: Fall Restrictions Weight Bearing Restrictions: No       Mobility Bed Mobility Overal bed mobility: Needs Assistance Bed Mobility: Supine to Sit Rolling: Total assist;+2 for physical assistance   Supine to sit: Total assist;+2 for physical assistance        Transfers Overall transfer level: Needs assistance Equipment used: 2 person hand held assist Transfers: Sit to/from Stand Sit to Stand: Total assist         General transfer comment: pt with posterior lean as well as walking feet too far out in front during transfer    Balance                                   ADL Overall ADL's : Needs assistance/impaired     Grooming: Wash/dry face;Bed level;Set up    Toilet Hygiene- total A of 2                              General ADL Comments: pt did sit EOB and get to chair with OT and PT                Cognition   Behavior During Therapy: [facility] for tasks assessed/performed Overall Cognitive Status: Within Functional Limits for tasks assessed                        Extremity/Trunk Assessment  Upper Extremity Assessment Upper Extremity Assessment: Generalized weakness   Lower Extremity Assessment Lower Extremity Assessment: Generalized weakness   Cervical / Trunk Assessment Cervical / Trunk Assessment: Kyphotic    Exercises     Shoulder Instructions       General Comments      Pertinent Vitals/ Pain       Pain Assessment: 0-10 Pain Score: 6  Pain Location: back Pain Descriptors / Indicators: Sore Pain Intervention(s): Limited activity within patient's tolerance;Monitored during session;Repositioned  Home Living Family/patient expects to be discharged to:: Skilled nursing facility                                        Prior Functioning/Environment          Comments: used RW and WC PTA, was at ALF   Frequency       Progress Toward Goals  OT Goals(current goals can now be found in the care plan section)  Progress towards OT goals: Progressing toward goals     Plan Discharge plan remains  appropriate    Co-evaluation    PT/OT/SLP Co-Evaluation/Treatment: Yes     OT goals addressed during session: ADL's and self-care      End of Session     Activity Tolerance Patient tolerated treatment well   Patient Left in chair;with call bell/phone within reach;with chair alarm set;with nursing/sitter in room   Nurse Communication Mobility status    Functional Assessment Tool Used: clinical observation Functional Limitation: Self care Self Care Current Status (W0981(G8987): At least 80 percent but less than 100 percent impaired, limited or restricted Self Care Goal Status ([id number](G8988): At least 20 percent but less than 40 percent impaired, limited or restricted   Time: 1146-1205 OT Time Calculation (min): 19 min  Charges: OT G-codes **NOT FOR INPATIENT CLASS** Functional Assessment Tool Used: clinical observation Functional Limitation: Self care Self Care Current Status (W2956(G8987): At least 80 percent but less  than 100 percent impaired, limited or restricted Self Care Goal Status (O1308(G8988): At least 20 percent but less than 40 percent impaired, limited or restricted OT General Charges $OT Visit: 1 Procedure OT Treatments $Self Care/Home Management : 8-22 mins  Veronia Laprise, Metro KungLorraine D 08/10/2015, 12:21 PM

## 2015-08-10 NOTE — Progress Notes (Signed)
CSW spoke with daughter Misty StanleyLisa regarding disposition plan. D[facility] to voice [facility] however, CSW informed daughter that there are no beds available. Daughter informed CSW of her second [facility] which [facility]. CSW followed up with facility. Per Dois DavenportSandra in Admissions, she will follow up with CSW regarding possible bed.   Fernande BoydenJoyce Nijah Orlich, [facility] HospitalCSWA Clinical Social Worker [facility] [phone]

## 2015-08-10 NOTE — Evaluation (Addendum)
Physical Therapy Evaluation Patient Details Name: Ashley Swanson: [id number] DOB: June 12, 1929 Today's Date: 08/10/2015   History of Present Illness  Ashley Swanson is a 81 y.o. female with a past medical history of hyperlipidemia, osteoporosis, diverticulosis, GERD, hypothyroidism, hypertension, depression who comes to the emergency department after losing her balance in her bathroom and falling backwards hitting her head. She denies loss of consciousness, but complains of headache, back pain and chest wall pain. She denies precordial chest pain, dizziness, palpitations, dyspnea, nausea or emesis.  Clinical Impression  Pt admitted with above diagnosis. Pt currently with functional limitations due to the deficits listed below (see PT Problem List). +2 total assist for bed to recliner transfer, pt has heavy posterior lean and is also limited by back pain.  Pt will benefit from skilled PT to increase their independence and safety with mobility to allow discharge to the venue listed below.       Follow Up Recommendations SNF;Supervision/Assistance - 24 hour    Equipment Recommendations  None recommended by PT    Recommendations for Other Services OT consult     Precautions / Restrictions Precautions Precautions: Fall Restrictions Weight Bearing Restrictions: No      Mobility  Bed Mobility Overal bed mobility: Needs Assistance Bed Mobility: Supine to Sit Rolling: Total assist;+2 for physical assistance   Supine to sit: Total assist;+2 for physical assistance        Transfers Overall transfer level: Needs assistance Equipment used: 2 person hand held assist Transfers: Sit to/from Stand Sit to Stand: Total assist         General transfer comment: pt with posterior lean as well as walking feet too far out in front during transfer  Ambulation/Gait                Stairs            Wheelchair Mobility    Modified Rankin (Stroke Patients Only)        Balance Overall balance assessment: Needs assistance   Sitting balance-Leahy Scale: Fair   Postural control: Posterior lean   Standing balance-Leahy Scale: Zero                               Pertinent Vitals/Pain Pain Assessment: 0-10 Pain Score: 6  Pain Location: back Pain Descriptors / Indicators: Sore Pain Intervention(s): Limited activity within patient's tolerance;Monitored during session;Patient requesting pain meds-RN notified    Home Living Family/patient expects to be discharged to:: Skilled nursing facility                      Prior Function           Comments: used RW and WC PTA, was at ALF     Hand Dominance        Extremity/Trunk Assessment   Upper Extremity Assessment: Generalized weakness           Lower Extremity Assessment: Generalized weakness      Cervical / Trunk Assessment: Kyphotic  Communication   Communication: HOH  Cognition Arousal/Alertness: Awake/alert Behavior During Therapy: WFL for tasks assessed/performed Overall Cognitive Status: Within Functional Limits for tasks assessed                      General Comments      Exercises        Assessment/Plan    PT Assessment Patient needs continued PT services  PT  Diagnosis Difficulty walking;Generalized weakness;Acute pain   PT Problem List Decreased activity tolerance;Decreased strength;Decreased mobility;Pain;Decreased balance;Decreased safety awareness  PT Treatment Interventions Gait training;Functional mobility training;Therapeutic activities;Patient/family education;Balance training;Therapeutic exercise   PT Goals (Current goals can be found in the Care Plan section) Acute Rehab PT Goals Patient Stated Goal: decrease pain PT Goal Formulation: With patient Time For Goal Achievement: 08/24/15 Potential to Achieve Goals: Fair    Frequency Min 3X/week   Barriers to discharge        Co-evaluation       OT goals addressed  during session: ADL's and self-care       End of Session Equipment Utilized During Treatment: Gait belt Activity Tolerance: Patient limited by pain Patient left: in chair;with call bell/phone within reach;with chair alarm set Nurse Communication: Mobility status         Time: 1610-96041145-1216 PT Time Calculation (min) (ACUTE ONLY): 31 min   Charges:   PT Evaluation $Initial PT Evaluation Tier I: 1 Procedure PT Treatments $Therapeutic Activity: 8-22 mins   PT G Codes:  PT G-Codes **NOT FOR INPATIENT CLASS** Functional Assessment Tool Used clinical judgement clinical judgement at 1245 on 08/10/15 by Alvester MorinJennifer K Circe Chilton, PT Functional Limitation Mobility: Walking and moving around Mobility: Walking and moving around at 1245 on 08/10/15 by Alvester MorinJennifer K Donni Oglesby, PT Mobility: Walking and Moving Around Current Status [phone]([id number]) At least 80 percent but less than 100 percent impaired, limited or restricted CM at 1245 on 08/10/15 by Alvester MorinJennifer K Markas [place], PT Mobility: Walking and Moving Around Goal Status [phone]([phone]) At least 40 percent but less than 60 percent impaired, limited or restricted CK at 1245 on 08/10/15 by Alvester MorinJennifer K Jakolby Sedivy, PT Mobility: Walking and Moving Around Discharge Status [phone](G8980)        [name], [name] 08/10/2015, 12:45 PM [phone]

## 2015-08-10 NOTE — Progress Notes (Signed)
CSW attempted to contact patient's daughter Misty StanleyLisa but received no answer. CSW left voice message at 11:42AM. CSW will continue to follow regarding disposition plan.   Fernande BoydenJoyce Tonica Brasington, [facility] CampusCSWA Clinical Social Worker [facility] [phone]

## 2015-08-10 NOTE — Discharge Instructions (Signed)
Spinal Compression Fracture °A spinal compression fracture is a collapse of the bones that form the spine (vertebrae). With this type of fracture, the vertebrae become squashed (compressed) into a wedge shape. Most compression fractures happen in the middle or lower part of the spine. °CAUSES °This condition may be caused by: °· Thinning and loss of density in the bones (osteoporosis). This is the most common cause. °· A fall. °· A car or motorcycle accident. °· Cancer. °· Trauma, such as a heavy, direct hit to the head. °RISK FACTORS °You may be at greater risk for a spinal compression fracture if you: °· Are 50 years old or older. °· Have osteoporosis. °· Have certain types of cancer, including: °¨ Multiple myeloma. °¨ Lymphoma. °¨ Prostate cancer. °¨ Lung cancer. °¨ Breast cancer. °SYMPTOMS °Symptoms of this condition include: °· Severe pain. °· Pain that gets worse over time. °· Pain that is worse when you stand, walk, sit, or bend. °· Sudden pain that is so bad that it is hard for you to move. °· Bending or humping of the spine. °· Gradual loss of height. °· Numbness, tingling, or weakness in the back and legs. °· Trouble walking. °Your symptoms will depend on the cause of the fracture and how quickly it develops. For example, fractures that are caused by osteoporosis can cause few symptoms, no symptoms, or symptoms that develop slowly over time. °DIAGNOSIS °This condition may be diagnosed based on symptoms, medical history, and a physical exam. During the physical exam, your health care provider may tap along the length of your spine to check for tenderness. Tests may be done to confirm the diagnosis. They may include: °· A bone density test to check for osteoporosis. °· Imaging tests, such as a spine X-ray, a CT scan, or MRI. °TREATMENT °Treatment for this condition depends on the cause and severity of the condition. Some fractures, such as those that are caused by osteoporosis, may heal on their own with  supportive care. This may include: °· Pain medicine. °· Rest. °· A back brace. °· Physical therapy exercises. °· Medicine that reduces bone pain. °· Calcium and vitamin D supplements. °Fractures that cause the back to become misshapen, cause nerve pain or weakness, or do not respond to other treatment may be treated with a surgical procedure, such as: °· Vertebroplasty. In this procedure, bone cement is injected into the collapsed vertebrae to stabilize them. °· Balloon kyphoplasty. In this procedure, the collapsed vertebrae are expanded with a balloon and then bone cement is injected into them. °· Spinal fusion. In this procedure, the collapsed vertebrae are connected (fused) to normal vertebrae. °HOME CARE INSTRUCTIONS °General Instructions °· Take medicines only as directed by your health care provider. °· Do not drive or operate heavy machinery while taking pain medicine. °· If directed, apply ice to the injured area: °¨ Put ice in a plastic bag. °¨ Place a towel between your skin and the bag. °¨ Leave the ice on for 30 minutes every two hours at first. Then apply the ice as needed. °· Wear your neck brace or back brace as directed by your health care provider. °· Do not drink alcohol. Alcohol can interfere with your treatment. °· Keep all follow-up visits as directed by your health care provider. This is important. It can help to prevent permanent injury, disability, and long-lasting (chronic) pain. °Activity °· Stay in bed (on bed rest) only as directed by your health care provider. Being on bed rest for too long can   make your condition worse.  Return to your normal activities as directed by your health care provider. Ask what activities are safe for you.  Do exercises to improve motion and strength in your back (physical therapy), as recommended by your health care provider.  Exercise regularly as directed by your health care provider. SEEK MEDICAL CARE IF:  You have a fever.  You develop a cough  that makes your pain worse.  Your pain medicine is not helping.  Your pain does not get better over time.  You cannot return to your normal activities as planned or expected. SEEK IMMEDIATE MEDICAL CARE IF:  Your pain is very bad and it suddenly gets worse.  You are unable to move any body part (paralysis) that is below the level of your injury.  You have numbness, tingling, or weakness in any body part that is below the level of your injury.  You cannot control your bladder or bowels.   This information is not intended to replace advice given to you by your health care provider. Make sure you discuss any questions you have with your health care provider.   Document Released: 09/17/2005 Document Revised: 02/01/2015 Document Reviewed: 09/21/2014 Elsevier Interactive Patient Education [employer].

## 2015-08-10 NOTE — Discharge Summary (Addendum)
Physician Discharge Summary  Jaleea Alesi ZOX:[id number] DOB: 05-Nov-1928 DOA: 08/06/2015  PCP: [name], MD  Admit date: 08/06/2015 Discharge date: 08/10/2015  Recommendations for Outpatient Follow-up:  1. Pt will need to follow up with PCP in 2-3 weeks post discharge 2. Please obtain BMP to evaluate electrolytes and kidney function  Discharge Diagnoses:  Principal Problem:   Thoracic compression fracture (HCC) Active Problems:   GERD (gastroesophageal reflux disease)   Hypertension   Depression   Discharge Condition: Stable  Diet recommendation:dys I diet  History of present illness:  81 y.o. female with a past medical history of hyperlipidemia, osteoporosis, diverticulosis, GERD, hypothyroidism, hypertension, depression who comes to the emergency department after losing her balance in the bathroom and falling backwards hitting her head. She denies loss of consciousness, but complained of headache, back pain and chest wall pain.  Workup in the emergency department revealed a compression fracture of T8 and chronic to subacute fracture of C-6, which have been suggested by radiology to obtain an MRI for further evaluation.  Hospital Course:  1. Fall/Back and Neck pain - T8 compression fracture new and old T10/T11 fractures - Neck pain-Nondisplaced fracture of the left inferior articulating facet of C6 with sclerotic margin on either side of the fracture suggesting a subacute or chronic injury - per neurosurgery,  C 6 facet fracture is not new, since has sclerotic rim and hence C collar not recommended - Supportive care recommended - for acute T8 fracture, neurosurgeon said Kyphoplasty would not be an option, due to severe OA and collapse - called and updated Daughter today - PT eval done, SNF recommended, placement in progress   2. Severe OA  3. HTN, essentia  - reasonable inpatient control   4. Dehydration - resolved    Code Status: Full Code Family  Communication:none at bedside, called and d/w daughter Misty Stanley 11/09 Disposition Plan: SNF today   Procedures/Studies: Dg Chest 2 View  08/06/2015  CLINICAL DATA:  Diffuse chest pain following a fall in the bathroom last night. EXAM: CHEST  2 VIEW COMPARISON:  None. FINDINGS: Mildly enlarged cardiac silhouette. Tortuous aorta. Clear lungs with mild linear density at the medial left lung base. Diffuse osteopenia. Multiple thoracic vertebral compression deformities with kyphoplasty material. No acute fracture or pneumothorax identified. IMPRESSION: 1. No acute abnormality. 2. Mild cardiomegaly. 3. Mild linear scarring at the left lung base. Electronically Signed   By: Beckie Salts M.D.   On: 08/06/2015 18:16   Dg Thoracic Spine 2 View  08/06/2015  CLINICAL DATA:  Pt states she fell and hit her head in the bathroom last night at assisting living facility. Pt denies LOC. Pt c/o of diffused chest pain and tspine pain. EXAM: THORACIC SPINE 2 VIEWS COMPARISON:  None. FINDINGS: Multiple thoracic compression fractures. There is vertebroplasty cement at the T10 and T11 levels, stabilizing a moderate fracture of T10 and a mild compression fracture of T11. Some of the cement pints extruded anterior to the T10 vertebra, which appears chronic. New there is a moderate to severe compression fracture T8. This is unchanged. The bones are diffusely demineralized. There is mild degenerate spurring along the mid and lower thoracic spine. IMPRESSION: 1. Compression fractures of T8, T10 and T11. T10 and T11 are chronic, with the T8 fracture being of unclear chronicity. Electronically Signed   By: Amie Portland M.D.   On: 08/06/2015 18:18   Ct Head Wo Contrast  08/06/2015  CLINICAL DATA:  Status post fall, neck pain EXAM: CT HEAD WITHOUT  CONTRAST CT CERVICAL SPINE WITHOUT CONTRAST TECHNIQUE: Multidetector CT imaging of the head and cervical spine was performed following the standard protocol without intravenous contrast.  Multiplanar CT image reconstructions of the cervical spine were also generated. COMPARISON:  None. FINDINGS: CT HEAD FINDINGS There is no evidence of mass effect, midline shift, or extra-axial fluid collections. There is no evidence of a space-occupying lesion or intracranial hemorrhage. There is no evidence of a cortical-based area of acute infarction. There is generalized cerebral atrophy. There is periventricular white matter low attenuation likely secondary to microangiopathy. The ventricles and sulci are appropriate for the patient's age. The basal cisterns are patent. Visualized portions of the orbits are unremarkable. The visualized portions of the paranasal sinuses and mastoid air cells are unremarkable. Cerebrovascular atherosclerotic calcifications are noted. The osseous structures are unremarkable. CT CERVICAL SPINE FINDINGS The alignment is anatomic. The vertebral body heights are maintained. There is a nondisplaced fracture of the left inferior articulating facet of C6. There is a sclerotic margin on either side of the fracture suggesting a subacute or chronic injury. There is no other fracture or subluxation of the cervical spine. There is no static listhesis. The prevertebral soft tissues are normal. The intraspinal soft tissues are not fully imaged on this examination due to poor soft tissue contrast, but there is no gross soft tissue abnormality. The disc spaces are maintained. There is bilateral facet arthropathy at C3-4, C4-5, C5-6 and C6-7. The visualized portions of the lung apices demonstrate no focal abnormality. There is bilateral carotid artery atherosclerosis. IMPRESSION: 1. No acute intracranial pathology. 2. Nondisplaced fracture of the left inferior articulating facet of C6. There is a sclerotic margin on either side of the fracture suggesting a subacute or chronic injury. There is no other fracture or subluxation of the cervical spine. If there is further clinical concern, recommend  MRI. There is surgical follow-up is recommended. Electronically Signed   By: Elige Ko   On: 08/06/2015 17:46   Ct Cervical Spine Wo Contrast  08/06/2015  CLINICAL DATA:  Status post fall, neck pain EXAM: CT HEAD WITHOUT CONTRAST CT CERVICAL SPINE WITHOUT CONTRAST TECHNIQUE: Multidetector CT imaging of the head and cervical spine was performed following the standard protocol without intravenous contrast. Multiplanar CT image reconstructions of the cervical spine were also generated. COMPARISON:  None. FINDINGS: CT HEAD FINDINGS There is no evidence of mass effect, midline shift, or extra-axial fluid collections. There is no evidence of a space-occupying lesion or intracranial hemorrhage. There is no evidence of a cortical-based area of acute infarction. There is generalized cerebral atrophy. There is periventricular white matter low attenuation likely secondary to microangiopathy. The ventricles and sulci are appropriate for the patient's age. The basal cisterns are patent. Visualized portions of the orbits are unremarkable. The visualized portions of the paranasal sinuses and mastoid air cells are unremarkable. Cerebrovascular atherosclerotic calcifications are noted. The osseous structures are unremarkable. CT CERVICAL SPINE FINDINGS The alignment is anatomic. The vertebral body heights are maintained. There is a nondisplaced fracture of the left inferior articulating facet of C6. There is a sclerotic margin on either side of the fracture suggesting a subacute or chronic injury. There is no other fracture or subluxation of the cervical spine. There is no static listhesis. The prevertebral soft tissues are normal. The intraspinal soft tissues are not fully imaged on this examination due to poor soft tissue contrast, but there is no gross soft tissue abnormality. The disc spaces are maintained. There is bilateral facet arthropathy at  C3-4, C4-5, C5-6 and C6-7. The visualized portions of the lung apices  demonstrate no focal abnormality. There is bilateral carotid artery atherosclerosis. IMPRESSION: 1. No acute intracranial pathology. 2. Nondisplaced fracture of the left inferior articulating facet of C6. There is a sclerotic margin on either side of the fracture suggesting a subacute or chronic injury. There is no other fracture or subluxation of the cervical spine. If there is further clinical concern, recommend MRI. There is surgical follow-up is recommended. Electronically Signed   By: Elige KoHetal  Patel   On: 08/06/2015 17:46    Discharge Exam: Filed Vitals:   08/09/15 2058  BP: 138/61  Pulse: 64  Temp: 97.7 F (36.5 C)  Resp: 20   Filed Vitals:   08/08/15 2123 08/09/15 0456 08/09/15 1527 08/09/15 2058  BP: 136/67 151/74 146/64 138/61  Pulse: 71 63 63 64  Temp: 98.2 F (36.8 C) 98.3 F (36.8 C) 97.7 F (36.5 C) 97.7 F (36.5 C)  TempSrc: Oral Oral Oral Oral  Resp: 20 20 20 20   Height:      Weight:      SpO2: 97% 98% 96% 97%    General: Pt is alert, follows commands appropriately, not in acute distress Cardiovascular: Regular rate and rhythm, no rubs, no gallops Respiratory: Clear to auscultation bilaterally, no wheezing, no crackles, no rhonchi Abdominal: Soft, non tender, non distended, bowel sounds +, no guarding  Discharge Instructions     Medication List    STOP taking these medications        amoxicillin 500 MG tablet  Commonly known as:  AMOXIL      TAKE these medications        acetaminophen 325 MG tablet  Commonly known as:  TYLENOL  Take 650 mg by mouth every 8 (eight) hours as needed for moderate pain.     albuterol (2.5 MG/3ML) 0.083% nebulizer solution  Commonly known as:  PROVENTIL  Take 2.5 mg by nebulization every 4 (four) hours as needed for wheezing or shortness of breath.     aluminum-magnesium hydroxide-simethicone 200-200-20 MG/5ML Susp  Commonly known as:  MAALOX  Take 15 mLs by mouth every 4 (four) hours as needed (reflux).     ANBESOL  10 % mucosal gel  Generic drug:  benzocaine  Use as directed 1 application in the mouth or throat as needed for mouth pain.     bisacodyl 10 MG suppository  Commonly known as:  DULCOLAX  Place 10 mg rectally as needed for moderate constipation.     brimonidine 0.2 % ophthalmic solution  Commonly known as:  ALPHAGAN  Place 1 drop into the right eye 3 (three) times daily.     calcium carbonate 1250 (500 CA) MG tablet  Commonly known as:  OS-CAL - dosed in mg of elemental calcium  Take 1 tablet by mouth daily with breakfast.     docusate sodium 100 MG capsule  Commonly known as:  COLACE  Take 200 mg by mouth every morning.     dorzolamide-timolol 22.3-6.8 MG/ML ophthalmic solution  Commonly known as:  COSOPT  Place 1 drop into both eyes 2 (two) times daily.     escitalopram 20 MG tablet  Commonly known as:  LEXAPRO  Take 40 mg by mouth daily.     Fish Oil 1000 MG Caps  Take 1 capsule by mouth daily.     Melatonin 10 MG Caps  Take 10 mg by mouth at bedtime.     multivitamin with minerals Tabs  tablet  Take 1 tablet by mouth daily.     oxyCODONE 5 MG immediate release tablet  Commonly known as:  Oxy IR/ROXICODONE  Take 1 tablet (5 mg total) by mouth every 4 (four) hours as needed for moderate pain or severe pain.     OXYGEN  Inhale 2 L/min into the lungs as needed (low o2 levels).     prednisoLONE acetate 1 % ophthalmic suspension  Commonly known as:  PRED FORTE  Place 1 drop into the right eye 4 (four) times daily.     ranitidine 300 MG tablet  Commonly known as:  ZANTAC  Take 300 mg by mouth at bedtime.     vitamin B-12 1000 MCG tablet  Commonly known as:  CYANOCOBALAMIN  Take 1,000 mcg by mouth daily.      ASK your doctor about these medications        traMADol 50 MG tablet  Commonly known as:  ULTRAM  Take 50 mg by mouth every 6 (six) hours as needed for moderate pain.           Follow-up Information    Follow up with [name], MD.    Specialty:  Internal Medicine   Contact information:   [address] [place] [address] [phone]        The results of significant diagnostics from this hospitalization (including imaging, microbiology, ancillary and laboratory) are listed below for reference.     Microbiology: No results found for this or any previous visit (from the past 240 hour(s)).   Labs: Basic Metabolic Panel:  Recent Labs Lab 08/06/15 2209 08/08/15 0532 08/09/15 0552  NA 143 139 139  K 3.7 4.4 4.5  CL 107 104 105  CO2 GLUCOSE 107* 96 103*  BUN 18 24* 16  CREATININE 0.81 0.74 0.61  CALCIUM 9.1 8.8* 8.6*   CBC:  Recent Labs Lab 08/06/15 2209 08/08/15 0532  WBC 8.5 7.5  NEUTROABS 5.7  --   HGB 12.9 13.0  HCT 41.7 42.9  MCV 96.3 99.8  PLT 192 194   SIGNED: Time coordinating discharge: 30 minutes  MAGICK-Ly Bacchi, MD  [facility] 08/10/2015, 6:20 AM Pager [phone]  If 7PM-7AM, please contact night-coverage [email or web address] Password [email or web address]

## 2015-08-12 ENCOUNTER — Non-Acute Institutional Stay (SKILLED_NURSING_FACILITY): Payer: Medicare Other | Admitting: Adult Health

## 2015-08-12 ENCOUNTER — Encounter: Payer: Self-pay | Admitting: Adult Health

## 2015-08-12 DIAGNOSIS — H409 Unspecified glaucoma: Secondary | ICD-10-CM | POA: Diagnosis not present

## 2015-08-12 DIAGNOSIS — F32A Depression, unspecified: Secondary | ICD-10-CM

## 2015-08-12 DIAGNOSIS — S22000S Wedge compression fracture of unspecified thoracic vertebra, sequela: Secondary | ICD-10-CM

## 2015-08-12 DIAGNOSIS — K5900 Constipation, unspecified: Secondary | ICD-10-CM

## 2015-08-12 DIAGNOSIS — Z9981 Dependence on supplemental oxygen: Secondary | ICD-10-CM | POA: Diagnosis not present

## 2015-08-12 DIAGNOSIS — F329 Major depressive disorder, single episode, unspecified: Secondary | ICD-10-CM

## 2015-08-12 DIAGNOSIS — K5909 Other constipation: Secondary | ICD-10-CM

## 2015-08-12 DIAGNOSIS — K219 Gastro-esophageal reflux disease without esophagitis: Secondary | ICD-10-CM

## 2015-08-12 MED ORDER — TRAMADOL HCL 50 MG PO TABS: 50.0000 mg | ORAL_TABLET | Freq: Four times a day (QID) | ORAL | Status: DC

## 2015-08-12 NOTE — Progress Notes (Signed)
Patient ID: Ashley Swanson, female   DOB: 1929-08-20, 81 y.o.   MRN: [id number]    [facility]      Allergies  Allergen Reactions  . Ciprofloxacin     unknown  . Enablex [Darifenacin Hydrobromide Er]     unknown  . Macrobid [employer][Nitrofurantoin Monohyd Macro]     unknown  . Metronidazole And Related     unknown  . Reclast [Zoledronic Acid]     unknown  . Sulfa Antibiotics     unknown    Chief Complaint  Patient presents with  . Hospitalization Follow-up    HPI:  She suffered a fall at her assisted living facility and suffered a compression fracture of T8 and chronic to subacute fracture of C-6.  She did receive IVF while in the hospital as well. She is a poor historian and cannot fully participate in the hpi or ros. She is complaining of pain in her back; neck and shoulders. She is here for short term rehab with her goal to return back to her assisted living.  According to her medical record there was an order to have her on duragesic patch; however; this was not on the discharge summary and there was not a hard script for this medication.    Past Medical History  Diagnosis Date  . Hyperlipidemia   . Osteoporosis   . Stasis dermatitis   . Diverticulosis   . Cataract   . Thyroid disease     hypothyroid  . GERD (gastroesophageal reflux disease)   . Hypertension   . Depression     Past Surgical History  Procedure Laterality Date  . Cholecystectomy    . Abdominal hysterectomy    . Tubal ligation    . Breast surgery      biopsy  . Tonsilectomy/adenoidectomy with myringotomy    . Cataract extraction    . Appendectomy    . Vertebrolplasty      VITAL SIGNS BP 136/66 mmHg  Pulse 76  Ht 4' 7.9" (1.42 m)  Wt 107 lb (48.535 kg)  BMI 24.07 kg/m2  SpO2 96%  Patient's Medications  New Prescriptions   No medications on file  Previous Medications   ACETAMINOPHEN (TYLENOL) 325 MG TABLET    Take 650 mg by mouth every 8 (eight) hours as needed for  moderate pain.   ALBUTEROL (PROVENTIL) (2.5 MG/3ML) 0.083% NEBULIZER SOLUTION    Take 2.5 mg by nebulization every 4 (four) hours as needed for wheezing or shortness of breath.   ALUMINUM-MAGNESIUM HYDROXIDE-SIMETHICONE (MAALOX) 200-200-20 MG/5ML SUSP    Take 15 mLs by mouth every 4 (four) hours as needed (reflux).   BENZOCAINE (ANBESOL) 10 % MUCOSAL GEL    Use as directed 1 application in the mouth or throat as needed for mouth pain.   BISACODYL (DULCOLAX) 10 MG SUPPOSITORY    Place 10 mg rectally as needed for moderate constipation.   BRIMONIDINE (ALPHAGAN) 0.2 % OPHTHALMIC SOLUTION    Place 1 drop into the right eye 3 (three) times daily.   CALCIUM CARBONATE (OS-CAL - DOSED IN MG OF ELEMENTAL CALCIUM) 1250 (500 CA) MG TABLET    Take 1 tablet by mouth daily with breakfast.   DOCUSATE SODIUM (COLACE) 100 MG CAPSULE    Take 200 mg by mouth every morning.   DORZOLAMIDE-TIMOLOL (COSOPT) 22.3-6.8 MG/ML OPHTHALMIC SOLUTION    Place 1 drop into both eyes 2 (two) times daily.   ESCITALOPRAM (LEXAPRO) 20 MG TABLET    Take 40  mg by mouth daily.   MELATONIN 10 MG CAPS    Take 10 mg by mouth at bedtime.   MULTIPLE VITAMIN (MULTIVITAMIN WITH MINERALS) TABS TABLET    Take 1 tablet by mouth daily.   OMEGA-3 FATTY ACIDS (FISH OIL) 1000 MG CAPS    Take 1 capsule by mouth daily.   OXYCODONE (OXY IR/ROXICODONE) 5 MG IMMEDIATE RELEASE TABLET    Take 1 tablet (5 mg total) by mouth every 4 (four) hours as needed for moderate pain or severe pain.   OXYGEN    Inhale 2 L/min into the lungs as needed (low o2 levels).   PREDNISOLONE ACETATE (PRED FORTE) 1 % OPHTHALMIC SUSPENSION    Place 1 drop into the right eye 4 (four) times daily.   RANITIDINE (ZANTAC) 300 MG TABLET    Take 300 mg by mouth at bedtime.   TRAMADOL (ULTRAM) 50 MG TABLET    Take 1 tablet (50 mg total) by mouth every 6 (six) hours as needed for moderate pain or severe pain.   VITAMIN B-12 (CYANOCOBALAMIN) 1000 MCG TABLET    Take 1,000 mcg by mouth daily.    Modified Medications   No medications on file  Discontinued Medications   FENTANYL (DURAGESIC - DOSED MCG/HR) 25 MCG/HR PATCH    Place 1 patch (25 mcg total) onto the skin every 3 (three) days.     SIGNIFICANT DIAGNOSTIC EXAMS  08-06-15: chest x-ray; 1. No acute abnormality. 2. Mild cardiomegaly. 3. Mild linear scarring at the left lung base.   08-06-15: ct of head and cervical spine: 1. No acute intracranial pathology.2. Nondisplaced fracture of the left inferior articulating facet of C6. There is a sclerotic margin on either side of the fracture suggesting a subacute or chronic injury. There is no other fracture or subluxation of the cervical spine. If there is further clinical concern, recommend MRI. There is surgical follow-up is recommended.  08-06-15: ct of thoracic spine: . Compression fractures of T8, T10 and T11. T10 and T11 are chronic, with the T8 fracture being of unclear chronicity.     LABS REVIEWED:   08-06-15: wbc 8.5; hgb 12.9; hct 41.7; mcv 96.3; plt 192; glucose 107; bun 18; creat 0.81; k+ 3.7; na++143 08-08-15: wbc 7.5; hgb 13.0; hct 42.9; mcv 99.8; plt 194;  08-10-15: wbc 5.4; hgb 12.1; hct 39.3; mcv 97.0; plt 191; glucose 96; bun 16; creat 0.64; k+ 4.3; na++139     Review of Systems  Unable to perform ROS: dementia   Physical Exam  Vitals reviewed. Constitutional: No distress.  Eyes: Conjunctivae are normal.  Neck: Neck supple. No JVD present. No thyromegaly present.  Cardiovascular: Normal rate, regular rhythm and intact distal pulses.   Respiratory: Effort normal and breath sounds normal. No respiratory distress. She has no wheezes.  GI: Soft. Bowel sounds are normal. She exhibits no distension. There is no tenderness.  Musculoskeletal: She exhibits no edema.  Able to move all extremities  Has pain in upper back; shoulders; neck   Lymphadenopathy:    She has no cervical adenopathy.  Neurological: She is alert.  Skin: Skin is warm and dry. She is not  diaphoretic.  Psychiatric: She has a normal mood and affect.      ASSESSMENT/ PLAN:  1. Anxiety and depression: will continue lexapro 40 mg daily is taking melatonin 10 mg nightly for sleep at this time will not make changes will monitor her status.   2. Genella Rife: will continue zantac 300 mg nightly  3. Constipation:  Will continue colace 200 mg daily;   4. Glaucoma: will continue alphagan to right eye three times daily; cosopt to both eyes twice daily; pred forte to right eyes four times daily   5. Compression fracture: subacute C6; new T8; and T10 and T11: will continue therapy as directed to improve upon pain and independence with her adl's and gait and balance; will continue oxycodone 5 mg every 4 hours as needed; will change ultram to 50 mg every 6 hours routinely   6.  02 dependence: will wean off 02 to keep sats >91 %; will have nursing staff use incentive spirometry every 2 hours while awake will monitor   Will check bmp next week   Time spent with patient  50  minutes >50% time spent counseling; reviewing medical record; tests; labs; and developing future plan of care      Synthia Innocent NP [facility]  Contact [phone] Monday through Friday 8am- 5pm  After hours call [phone]

## 2015-08-16 ENCOUNTER — Non-Acute Institutional Stay (SKILLED_NURSING_FACILITY): Payer: Medicare Other | Admitting: Internal Medicine

## 2015-08-16 DIAGNOSIS — F418 Other specified anxiety disorders: Secondary | ICD-10-CM

## 2015-08-16 DIAGNOSIS — I10 Essential (primary) hypertension: Secondary | ICD-10-CM

## 2015-08-16 DIAGNOSIS — W19XXXD Unspecified fall, subsequent encounter: Secondary | ICD-10-CM

## 2015-08-16 DIAGNOSIS — S22000S Wedge compression fracture of unspecified thoracic vertebra, sequela: Secondary | ICD-10-CM | POA: Diagnosis not present

## 2015-08-16 DIAGNOSIS — K219 Gastro-esophageal reflux disease without esophagitis: Secondary | ICD-10-CM

## 2015-08-16 DIAGNOSIS — S12501D Unspecified nondisplaced fracture of sixth cervical vertebra, subsequent encounter for fracture with routine healing: Secondary | ICD-10-CM | POA: Diagnosis not present

## 2015-08-17 ENCOUNTER — Encounter: Payer: Self-pay | Admitting: Internal Medicine

## 2015-08-17 NOTE — Progress Notes (Signed)
Patient ID: Ashley Swanson, female   DOB: July 01, 1929, 81 y.o.   MRN: [id number]    HISTORY AND PHYSICAL   DATE: 08/16/15  Location:  [place]: SNF [phone])   Extended Emergency Contact Information Primary Emergency Contact: [facility] Address: [address]          [place], [place] [address] Johnnette Litter of [place] Phone: [phone] Mobile Phone: [phone] Relation: Daughter Secondary Emergency Contact: [place], [place] Phone: [phone] Mobile Phone: [phone] Relation: Son  Advanced Directive information    Chief Complaint  Patient presents with  . New Admit To SNF    HPI:  81 yo female seen today as a new admission into SNF following hospital stay for acute thoracic compression fx s/p fall with head trauma, neck pain and OA. Imaging revealed new T8 compression fx and old fx at T10, T11. C6 subacute nondisplaced fx noted. neurosx determined she was not a surgical coandidate for T8 kyphoplasty. C6 fx to be treated conservatively. She presents to SNF for short term rehab  Pt c/o neck and back pain. No falls since SNF admission. She was at ALF prior to admission. No nursing issues. She is a poor historian due to cognitive deficits.hx obtained from chart.  Anxiety and depression- mood stable on lexapro 40 mg daily; melatonin 10 mg nightly for sleep  GERD - stable on zantac 300 mg nightly. Take colace daily for constipation  Glaucoma - stable on alphagan to right eye three times daily; cosopt to both eyes twice daily; pred forte to right eyes four times daily   02 dependence - tapering off [place] O2 to keep sats >91 %. Uses incentive spirometry every 2 hours while awake  HTN - diet controlled  Past Medical History  Diagnosis Date  . Hyperlipidemia   . Osteoporosis   . Stasis dermatitis   . Diverticulosis   . Cataract   . Thyroid disease     hypothyroid  . GERD  (gastroesophageal reflux disease)   . Hypertension   . Depression     Past Surgical History  Procedure Laterality Date  . Cholecystectomy    . Abdominal hysterectomy    . Tubal ligation    . Breast surgery      biopsy  . Tonsilectomy/adenoidectomy with myringotomy    . Cataract extraction    . Appendectomy    . Vertebrolplasty      Patient Care Team: Drake Leach, MD as PCP - General (Internal Medicine)  Social History   Social History  . Marital Status: Widowed    Spouse Name: N/A  . Number of Children: N/A  . Years of Education: N/A   Occupational History  . Not on file.   Social History Main Topics  . Smoking status: Former Research scientist (life sciences)  . Smokeless tobacco: Not on file  . Alcohol Use: Yes  . Drug Use: Not on file  . Sexual Activity: Not on file   Other Topics Concern  . Not on file   Social History Narrative     reports that she has quit smoking. She does not have any smokeless tobacco history on file. She reports that she drinks alcohol. Her drug history is not on file.  Family History  Problem Relation Age of Onset  . Aortic aneurysm Mother   . Cancer Father     mouth  . Diabetes Neg Hx   .  Coronary artery disease Neg Hx    Family Status  Relation Status Death Age  . Mother Deceased   . Father Deceased      There is no immunization history on file for this patient.  Allergies  Allergen Reactions  . Ciprofloxacin     unknown  . Enablex [Darifenacin Hydrobromide Er]     unknown  . Macrobid [employer] Macro]     unknown  . Metronidazole And Related     unknown  . Reclast [Zoledronic Acid]     unknown  . Sulfa Antibiotics     unknown    Medications: Patient's Medications  New Prescriptions   No medications on file  Previous Medications   ACETAMINOPHEN (TYLENOL) 325 MG TABLET    Take 650 mg by mouth every 8 (eight) hours as needed for moderate pain.   ALBUTEROL (PROVENTIL) (2.5 MG/3ML) 0.083% NEBULIZER SOLUTION    Take  2.5 mg by nebulization every 4 (four) hours as needed for wheezing or shortness of breath.   ALUMINUM-MAGNESIUM HYDROXIDE-SIMETHICONE (MAALOX) [phone] MG/5ML SUSP    Take 15 mLs by mouth every 4 (four) hours as needed (reflux).   BENZOCAINE (ANBESOL) 10 % MUCOSAL GEL    Use as directed 1 application in the mouth or throat as needed for mouth pain.   BISACODYL (DULCOLAX) 10 MG SUPPOSITORY    Place 10 mg rectally as needed for moderate constipation.   BRIMONIDINE (ALPHAGAN) 0.2 % OPHTHALMIC SOLUTION    Place 1 drop into the right eye 3 (three) times daily.   CALCIUM CARBONATE (OS-CAL - DOSED IN MG OF ELEMENTAL CALCIUM) 1250 (500 CA) MG TABLET    Take 1 tablet by mouth daily with breakfast.   DOCUSATE SODIUM (COLACE) 100 MG CAPSULE    Take 200 mg by mouth every morning.   DORZOLAMIDE-TIMOLOL (COSOPT) 22.3-6.8 MG/ML OPHTHALMIC SOLUTION    Place 1 drop into both eyes 2 (two) times daily.   ESCITALOPRAM (LEXAPRO) 20 MG TABLET    Take 40 mg by mouth daily.   MELATONIN 10 MG CAPS    Take 10 mg by mouth at bedtime.   MULTIPLE VITAMIN (MULTIVITAMIN WITH MINERALS) TABS TABLET    Take 1 tablet by mouth daily.   OMEGA-3 FATTY ACIDS (FISH OIL) 1000 MG CAPS    Take 1 capsule by mouth daily.   OXYCODONE (OXY IR/ROXICODONE) 5 MG IMMEDIATE RELEASE TABLET    Take 1 tablet (5 mg total) by mouth every 4 (four) hours as needed for moderate pain or severe pain.   OXYGEN    Inhale 2 L/min into the lungs as needed (low o2 levels).   PREDNISOLONE ACETATE (PRED FORTE) 1 % OPHTHALMIC SUSPENSION    Place 1 drop into the right eye 4 (four) times daily.   RANITIDINE (ZANTAC) 300 MG TABLET    Take 300 mg by mouth at bedtime.   TRAMADOL (ULTRAM) 50 MG TABLET    Take 1 tablet (50 mg total) by mouth 4 (four) times daily.   VITAMIN B-12 (CYANOCOBALAMIN) 1000 MCG TABLET    Take 1,000 mcg by mouth daily.  Modified Medications   No medications on file  Discontinued Medications   No medications on file    Review of Systems    Unable to perform ROS: Other  dpression  Filed Vitals:   08/16/15 0922  BP: 115/63  Pulse: 68  Temp: 98 F (36.7 C)  Weight: 107 lb (48.535 kg)  SpO2: 93%   Body mass index is 24.07  kg/(m^2).  Physical Exam  Constitutional: She appears well-developed and well-nourished.  Sitting in bed in NAD  HENT:  Mouth/Throat: Oropharynx is clear and moist. No oropharyngeal exudate.  Eyes: Pupils are equal, round, and reactive to light. No scleral icterus.  Neck: Neck supple. Carotid bruit is not present. No tracheal deviation present.  Cardiovascular: Normal rate, regular rhythm and intact distal pulses.  Exam reveals no gallop and no friction rub.   Murmur (1/6 SEM) heard. No LE edema b/l. no calf TTP.   Pulmonary/Chest: Effort normal and breath sounds normal. No stridor. No respiratory distress. She has no wheezes. She has no rales.  Abdominal: Soft. Bowel sounds are normal. She exhibits no distension and no mass. There is no hepatomegaly. There is no tenderness. There is no rebound and no guarding.  Musculoskeletal: She exhibits tenderness.  Thoracic kyphosis; no spinous process TTP  Lymphadenopathy:    She has no cervical adenopathy.  Neurological: She is alert.  Skin: Skin is warm and dry. No rash noted.  Psychiatric: She has a normal mood and affect. Her behavior is normal. Thought content normal.     Labs reviewed: Admission on 08/06/2015, Discharged on 08/10/2015  Component Date Value Ref Range Status  . Sodium 08/06/2015 143  135 - 145 mmol/L Final  . Potassium 08/06/2015 3.7  3.5 - 5.1 mmol/L Final  . Chloride 08/06/2015 107  101 - 111 mmol/L Final  . CO2 08/06/2015 31  22 - 32 mmol/L Final  . Glucose, Bld 08/06/2015 107* 65 - 99 mg/dL Final  . BUN 08/06/2015 18  6 - 20 mg/dL Final  . Creatinine, Ser 08/06/2015 0.81  0.44 - 1.00 mg/dL Final  . Calcium 08/06/2015 9.1  8.9 - 10.3 mg/dL Final  . GFR calc non Af Amer 08/06/2015 >60  >60 mL/min Final  . GFR calc Af Amer  08/06/2015 >60  >60 mL/min Final   Comment: (NOTE) The eGFR has been calculated using the CKD EPI equation. This calculation has not been validated in all clinical situations. eGFR's persistently <60 mL/min signify possible Chronic Kidney Disease.   . Anion gap 08/06/2015 5  5 - 15 Final  . WBC 08/06/2015 8.5  4.0 - 10.5 K/uL Final  . RBC 08/06/2015 4.33  3.87 - 5.11 MIL/uL Final  . Hemoglobin 08/06/2015 12.9  12.0 - 15.0 g/dL Final  . HCT 08/06/2015 41.7  36.0 - 46.0 % Final  . MCV 08/06/2015 96.3  78.0 - 100.0 fL Final  . MCH 08/06/2015 29.8  26.0 - 34.0 pg Final  . MCHC 08/06/2015 30.9  30.0 - 36.0 g/dL Final  . RDW 08/06/2015 13.7  11.5 - 15.5 % Final  . Platelets 08/06/2015 192  150 - 400 K/uL Final  . Neutrophils Relative % 08/06/2015 67   Final  . Neutro Abs 08/06/2015 5.7  1.7 - 7.7 K/uL Final  . Lymphocytes Relative 08/06/2015 19   Final  . Lymphs Abs 08/06/2015 1.6  0.7 - 4.0 K/uL Final  . Monocytes Relative 08/06/2015 12   Final  . Monocytes Absolute 08/06/2015 1.0  0.1 - 1.0 K/uL Final  . Eosinophils Relative 08/06/2015 2   Final  . Eosinophils Absolute 08/06/2015 0.2  0.0 - 0.7 K/uL Final  . Basophils Relative 08/06/2015 0   Final  . Basophils Absolute 08/06/2015 0.0  0.0 - 0.1 K/uL Final  . WBC 08/08/2015 7.5  4.0 - 10.5 K/uL Final  . RBC 08/08/2015 4.30  3.87 - 5.11 MIL/uL Final  . Hemoglobin  08/08/2015 13.0  12.0 - 15.0 g/dL Final  . HCT 08/08/2015 42.9  36.0 - 46.0 % Final  . MCV 08/08/2015 99.8  78.0 - 100.0 fL Final  . MCH 08/08/2015 30.2  26.0 - 34.0 pg Final  . MCHC 08/08/2015 30.3  30.0 - 36.0 g/dL Final  . RDW 08/08/2015 13.8  11.5 - 15.5 % Final  . Platelets 08/08/2015 194  150 - 400 K/uL Final  . Sodium 08/08/2015 139  135 - 145 mmol/L Final  . Potassium 08/08/2015 4.4  3.5 - 5.1 mmol/L Final  . Chloride 08/08/2015 104  101 - 111 mmol/L Final  . CO2 08/08/2015 30  22 - 32 mmol/L Final  . Glucose, Bld 08/08/2015 96  65 - 99 mg/dL Final  . BUN  08/08/2015 24* 6 - 20 mg/dL Final  . Creatinine, Ser 08/08/2015 0.74  0.44 - 1.00 mg/dL Final  . Calcium 08/08/2015 8.8* 8.9 - 10.3 mg/dL Final  . GFR calc non Af Amer 08/08/2015 >60  >60 mL/min Final  . GFR calc Af Amer 08/08/2015 >60  >60 mL/min Final   Comment: (NOTE) The eGFR has been calculated using the CKD EPI equation. This calculation has not been validated in all clinical situations. eGFR's persistently <60 mL/min signify possible Chronic Kidney Disease.   . Anion gap 08/08/2015 5  5 - 15 Final  . Sodium 08/09/2015 139  135 - 145 mmol/L Final  . Potassium 08/09/2015 4.5  3.5 - 5.1 mmol/L Final  . Chloride 08/09/2015 105  101 - 111 mmol/L Final  . CO2 08/09/2015 30  22 - 32 mmol/L Final  . Glucose, Bld 08/09/2015 103* 65 - 99 mg/dL Final  . BUN 08/09/2015 16  6 - 20 mg/dL Final  . Creatinine, Ser 08/09/2015 0.61  0.44 - 1.00 mg/dL Final  . Calcium 08/09/2015 8.6* 8.9 - 10.3 mg/dL Final  . GFR calc non Af Amer 08/09/2015 >60  >60 mL/min Final  . GFR calc Af Amer 08/09/2015 >60  >60 mL/min Final   Comment: (NOTE) The eGFR has been calculated using the CKD EPI equation. This calculation has not been validated in all clinical situations. eGFR's persistently <60 mL/min signify possible Chronic Kidney Disease.   . Anion gap 08/09/2015 4* 5 - 15 Final  . WBC 08/10/2015 5.4  4.0 - 10.5 K/uL Final  . RBC 08/10/2015 4.05  3.87 - 5.11 MIL/uL Final  . Hemoglobin 08/10/2015 12.1  12.0 - 15.0 g/dL Final  . HCT 08/10/2015 39.3  36.0 - 46.0 % Final  . MCV 08/10/2015 97.0  78.0 - 100.0 fL Final  . MCH 08/10/2015 29.9  26.0 - 34.0 pg Final  . MCHC 08/10/2015 30.8  30.0 - 36.0 g/dL Final  . RDW 08/10/2015 13.4  11.5 - 15.5 % Final  . Platelets 08/10/2015 191  150 - 400 K/uL Final  . Sodium 08/10/2015 139  135 - 145 mmol/L Final  . Potassium 08/10/2015 4.3  3.5 - 5.1 mmol/L Final  . Chloride 08/10/2015 103  101 - 111 mmol/L Final  . CO2 08/10/2015 30  22 - 32 mmol/L Final  . Glucose,  Bld 08/10/2015 96  65 - 99 mg/dL Final  . BUN 08/10/2015 16  6 - 20 mg/dL Final  . Creatinine, Ser 08/10/2015 0.64  0.44 - 1.00 mg/dL Final  . Calcium 08/10/2015 8.7* 8.9 - 10.3 mg/dL Final  . GFR calc non Af Amer 08/10/2015 >60  >60 mL/min Final  . GFR calc Af Amer 08/10/2015 >60  >  60 mL/min Final   Comment: (NOTE) The eGFR has been calculated using the CKD EPI equation. This calculation has not been validated in all clinical situations. eGFR's persistently <60 mL/min signify possible Chronic Kidney Disease.   . Anion gap 08/10/2015 6  5 - 15 Final    Dg Chest 2 View  08/06/2015  CLINICAL DATA:  Diffuse chest pain following a fall in the bathroom last night. EXAM: CHEST  2 VIEW COMPARISON:  None. FINDINGS: Mildly enlarged cardiac silhouette. Tortuous aorta. Clear lungs with mild linear density at the medial left lung base. Diffuse osteopenia. Multiple thoracic vertebral compression deformities with kyphoplasty material. No acute fracture or pneumothorax identified. IMPRESSION: 1. No acute abnormality. 2. Mild cardiomegaly. 3. Mild linear scarring at the left lung base. Electronically Signed   By: Claudie Revering M.D.   On: 08/06/2015 18:16   Dg Thoracic Spine 2 View  08/06/2015  CLINICAL DATA:  Pt states she fell and hit her head in the bathroom last night at assisting living facility. Pt denies LOC. Pt c/o of diffused chest pain and tspine pain. EXAM: THORACIC SPINE 2 VIEWS COMPARISON:  None. FINDINGS: Multiple thoracic compression fractures. There is vertebroplasty cement at the T10 and T11 levels, stabilizing a moderate fracture of T10 and a mild compression fracture of T11. Some of the cement pints extruded anterior to the T10 vertebra, which appears chronic. New there is a moderate to severe compression fracture T8. This is unchanged. The bones are diffusely demineralized. There is mild degenerate spurring along the mid and lower thoracic spine. IMPRESSION: 1. Compression fractures of T8, T10  and T11. T10 and T11 are chronic, with the T8 fracture being of unclear chronicity. Electronically Signed   By: Lajean Manes M.D.   On: 08/06/2015 18:18   Ct Head Wo Contrast  08/06/2015  CLINICAL DATA:  Status post fall, neck pain EXAM: CT HEAD WITHOUT CONTRAST CT CERVICAL SPINE WITHOUT CONTRAST TECHNIQUE: Multidetector CT imaging of the head and cervical spine was performed following the standard protocol without intravenous contrast. Multiplanar CT image reconstructions of the cervical spine were also generated. COMPARISON:  None. FINDINGS: CT HEAD FINDINGS There is no evidence of mass effect, midline shift, or extra-axial fluid collections. There is no evidence of a space-occupying lesion or intracranial hemorrhage. There is no evidence of a cortical-based area of acute infarction. There is generalized cerebral atrophy. There is periventricular white matter low attenuation likely secondary to microangiopathy. The ventricles and sulci are appropriate for the patient's age. The basal cisterns are patent. Visualized portions of the orbits are unremarkable. The visualized portions of the paranasal sinuses and mastoid air cells are unremarkable. Cerebrovascular atherosclerotic calcifications are noted. The osseous structures are unremarkable. CT CERVICAL SPINE FINDINGS The alignment is anatomic. The vertebral body heights are maintained. There is a nondisplaced fracture of the left inferior articulating facet of C6. There is a sclerotic margin on either side of the fracture suggesting a subacute or chronic injury. There is no other fracture or subluxation of the cervical spine. There is no static listhesis. The prevertebral soft tissues are normal. The intraspinal soft tissues are not fully imaged on this examination due to poor soft tissue contrast, but there is no gross soft tissue abnormality. The disc spaces are maintained. There is bilateral facet arthropathy at C3-4, C4-5, C5-6 and C6-7. The visualized  portions of the lung apices demonstrate no focal abnormality. There is bilateral carotid artery atherosclerosis. IMPRESSION: 1. No acute intracranial pathology. 2. Nondisplaced fracture of  the left inferior articulating facet of C6. There is a sclerotic margin on either side of the fracture suggesting a subacute or chronic injury. There is no other fracture or subluxation of the cervical spine. If there is further clinical concern, recommend MRI. There is surgical follow-up is recommended. Electronically Signed   By: Kathreen Devoid   On: 08/06/2015 17:46   Ct Cervical Spine Wo Contrast  08/06/2015  CLINICAL DATA:  Status post fall, neck pain EXAM: CT HEAD WITHOUT CONTRAST CT CERVICAL SPINE WITHOUT CONTRAST TECHNIQUE: Multidetector CT imaging of the head and cervical spine was performed following the standard protocol without intravenous contrast. Multiplanar CT image reconstructions of the cervical spine were also generated. COMPARISON:  None. FINDINGS: CT HEAD FINDINGS There is no evidence of mass effect, midline shift, or extra-axial fluid collections. There is no evidence of a space-occupying lesion or intracranial hemorrhage. There is no evidence of a cortical-based area of acute infarction. There is generalized cerebral atrophy. There is periventricular white matter low attenuation likely secondary to microangiopathy. The ventricles and sulci are appropriate for the patient's age. The basal cisterns are patent. Visualized portions of the orbits are unremarkable. The visualized portions of the paranasal sinuses and mastoid air cells are unremarkable. Cerebrovascular atherosclerotic calcifications are noted. The osseous structures are unremarkable. CT CERVICAL SPINE FINDINGS The alignment is anatomic. The vertebral body heights are maintained. There is a nondisplaced fracture of the left inferior articulating facet of C6. There is a sclerotic margin on either side of the fracture suggesting a subacute or chronic  injury. There is no other fracture or subluxation of the cervical spine. There is no static listhesis. The prevertebral soft tissues are normal. The intraspinal soft tissues are not fully imaged on this examination due to poor soft tissue contrast, but there is no gross soft tissue abnormality. The disc spaces are maintained. There is bilateral facet arthropathy at C3-4, C4-5, C5-6 and C6-7. The visualized portions of the lung apices demonstrate no focal abnormality. There is bilateral carotid artery atherosclerosis. IMPRESSION: 1. No acute intracranial pathology. 2. Nondisplaced fracture of the left inferior articulating facet of C6. There is a sclerotic margin on either side of the fracture suggesting a subacute or chronic injury. There is no other fracture or subluxation of the cervical spine. If there is further clinical concern, recommend MRI. There is surgical follow-up is recommended. Electronically Signed   By: Kathreen Devoid   On: 08/06/2015 17:46     Assessment/Plan   ICD-9-CM ICD-10-CM   1. Thoracic compression fracture, sequela 905.1 S22.000S    new T8; old T10, T11  2. Essential hypertension 401.9 I10   3. Depression 311 F32.9   4. Gastroesophageal reflux disease, esophagitis presence not specified 530.81 K21.9   5. Fall, subsequent encounter V58.89 W19.XXXD    I458.0     with head trauma  6.      nondisplaced C6 subacute fx  Follow BMP  Cont current meds as ordered  F/u with specialists as indicated  PT/OT as ordered  GOAL: short term rehab and d/c home when medically appropriate. Communicated with pt and nursing.  Will follow  Gergory Biello S. Perlie Gold  [facility] [address] [place], [place] [address] [phone] Cell (Monday-Friday 8 AM - 5 PM) [phone] After 5 PM and follow prompts

## 2015-08-17 NOTE — Progress Notes (Signed)
This encounter was created in error - please disregard.

## 2015-09-22 ENCOUNTER — Non-Acute Institutional Stay (SKILLED_NURSING_FACILITY): Payer: Medicare Other | Admitting: Adult Health

## 2015-09-22 DIAGNOSIS — K219 Gastro-esophageal reflux disease without esophagitis: Secondary | ICD-10-CM

## 2015-09-22 DIAGNOSIS — I10 Essential (primary) hypertension: Secondary | ICD-10-CM

## 2015-09-22 DIAGNOSIS — F329 Major depressive disorder, single episode, unspecified: Secondary | ICD-10-CM

## 2015-09-22 DIAGNOSIS — K5900 Constipation, unspecified: Secondary | ICD-10-CM

## 2015-09-22 DIAGNOSIS — K5909 Other constipation: Secondary | ICD-10-CM

## 2015-09-22 DIAGNOSIS — S22000S Wedge compression fracture of unspecified thoracic vertebra, sequela: Secondary | ICD-10-CM | POA: Diagnosis not present

## 2015-09-22 DIAGNOSIS — F32A Depression, unspecified: Secondary | ICD-10-CM

## 2015-09-22 DIAGNOSIS — H409 Unspecified glaucoma: Secondary | ICD-10-CM | POA: Diagnosis not present

## 2015-10-01 ENCOUNTER — Encounter: Payer: Self-pay | Admitting: Adult Health

## 2015-10-01 NOTE — Progress Notes (Signed)
Patient ID: Ashley Swanson, female   DOB: May 17, 1929, 81 y.o.   MRN: [id number]    Facility: [facility]      Allergies  Allergen Reactions  . Ciprofloxacin     unknown  . Enablex [Darifenacin Hydrobromide Er]     unknown  . Macrobid [employer] Macro]     unknown  . Metronidazole And Related     unknown  . Reclast [Zoledronic Acid]     unknown  . Sulfa Antibiotics     unknown    Chief Complaint  Patient presents with  . Medical Management of Chronic Issues    HPI:  She is a resident of this facility being seen for the management of her chronic illnesses. She is unable to fully participate in the phi or ros. She did tell me that she wants to go home and does not feel at all. There are no nursing concerns at this time.   Past Medical History  Diagnosis Date  . Hyperlipidemia   . Osteoporosis   . Stasis dermatitis   . Diverticulosis   . Cataract   . Thyroid disease     hypothyroid  . GERD (gastroesophageal reflux disease)   . Hypertension   . Depression     Past Surgical History  Procedure Laterality Date  . Cholecystectomy    . Abdominal hysterectomy    . Tubal ligation    . Breast surgery      biopsy  . Tonsilectomy/adenoidectomy with myringotomy    . Cataract extraction    . Appendectomy    . Vertebrolplasty      VITAL SIGNS BP 140/80 mmHg  Pulse 68  Ht 4' 7.9" (1.42 m)  Wt 106 lb 12.8 oz (48.444 kg)  BMI 24.02 kg/m2  Patient's Medications  New Prescriptions   No medications on file  Previous Medications   ACETAMINOPHEN (TYLENOL) 325 MG TABLET    Take 650 mg by mouth every 8 (eight) hours as needed for moderate pain.   ALBUTEROL (PROVENTIL) (2.5 MG/3ML) 0.083% NEBULIZER SOLUTION    Take 2.5 mg by nebulization every 4 (four) hours as needed for wheezing or shortness of breath.   ALUMINUM-MAGNESIUM HYDROXIDE-SIMETHICONE (MAALOX) 200-200-20 MG/5ML SUSP    Take 15 mLs by mouth every 4 (four) hours as needed (reflux).   BENZOCAINE  (ANBESOL) 10 % MUCOSAL GEL    Use as directed 1 application in the mouth or throat as needed for mouth pain.   BISACODYL (DULCOLAX) 10 MG SUPPOSITORY    Place 10 mg rectally as needed for moderate constipation.   BRIMONIDINE (ALPHAGAN) 0.2 % OPHTHALMIC SOLUTION    Place 1 drop into the right eye 3 (three) times daily.   CALCIUM CARBONATE (OS-CAL - DOSED IN MG OF ELEMENTAL CALCIUM) 1250 (500 CA) MG TABLET    Take 1 tablet by mouth daily with breakfast.   DOCUSATE SODIUM (COLACE) 100 MG CAPSULE    Take 200 mg by mouth every morning.   DORZOLAMIDE-TIMOLOL (COSOPT) 22.3-6.8 MG/ML OPHTHALMIC SOLUTION    Place 1 drop into both eyes 2 (two) times daily.   ESCITALOPRAM (LEXAPRO) 20 MG TABLET    Take 40 mg by mouth daily.   FOOD THICKENER (THICK IT) POWD    Take 1 Container by mouth as needed. HONEY   MELATONIN 10 MG CAPS    Take 10 mg by mouth at bedtime.   MULTIPLE VITAMIN (MULTIVITAMIN WITH MINERALS) TABS TABLET    Take 1 tablet by mouth daily.   OMEGA-3  FATTY ACIDS (FISH OIL) 1000 MG CAPS    Take 1 capsule by mouth daily.   OXYCODONE (OXY IR/ROXICODONE) 5 MG IMMEDIATE RELEASE TABLET    Take 1 tablet (5 mg total) by mouth every 4 (four) hours as needed for moderate pain or severe pain.   PREDNISOLONE ACETATE (PRED FORTE) 1 % OPHTHALMIC SUSPENSION    Place 1 drop into the right eye 4 (four) times daily.   RANITIDINE (ZANTAC) 300 MG TABLET    Take 300 mg by mouth at bedtime.   TRAMADOL (ULTRAM) 50 MG TABLET    50 mg every 6 hours as needed    VITAMIN B-12 (CYANOCOBALAMIN) 1000 MCG TABLET    Take 1,000 mcg by mouth daily.  Modified Medications   No medications on file  Discontinued Medications   No medications on file     SIGNIFICANT DIAGNOSTIC EXAMS   08-06-15: chest x-ray; 1. No acute abnormality. 2. Mild cardiomegaly. 3. Mild linear scarring at the left lung base.   08-06-15: ct of head and cervical spine: 1. No acute intracranial pathology.2. Nondisplaced fracture of the left inferior  articulating facet of C6. There is a sclerotic margin on either side of the fracture suggesting a subacute or chronic injury. There is no other fracture or subluxation of the cervical spine. If there is further clinical concern, recommend MRI. There is surgical follow-up is recommended.  08-06-15: ct of thoracic spine: . Compression fractures of T8, T10 and T11. T10 and T11 are chronic, with the T8 fracture being of unclear chronicity.     LABS REVIEWED:   08-06-15: wbc 8.5; hgb 12.9; hct 41.7; mcv 96.3; plt 192; glucose 107; bun 18; creat 0.81; k+ 3.7; na++143 08-08-15: wbc 7.5; hgb 13.0; hct 42.9; mcv 99.8; plt 194;  08-10-15: wbc 5.4; hgb 12.1; hct 39.3; mcv 97.0; plt 191; glucose 96; bun 16; creat 0.64; k+ 4.3; na++139  08-12-15: glucose 97; bun 15; creat 0.73; k+ 4.5; na++141 08-19-15: glucose 83; bun 15; creat 0.71; k+ 4.6; na++143     Review of Systems  Unable to perform ROS: dementia     Physical Exam  Vitals reviewed. Constitutional: No distress.  Eyes: Conjunctivae are normal.  Neck: Neck supple. No JVD present. No thyromegaly present.  Cardiovascular: Normal rate, regular rhythm and intact distal pulses.   Respiratory: Effort normal and breath sounds normal. No respiratory distress. She has no wheezes.  GI: Soft. Bowel sounds are normal. She exhibits no distension. There is no tenderness.  Musculoskeletal: She exhibits no edema.  Able to move all extremities    Lymphadenopathy:    She has no cervical adenopathy.  Neurological: She is alert.  Skin: Skin is warm and dry. She is not diaphoretic.  Psychiatric: She has a normal mood and affect.      ASSESSMENT/ PLAN:  1. Anxiety and depression: will continue lexapro 40 mg daily is taking melatonin 10 mg nightly for sleep at this time will not make changes will monitor her status.   2. Genella RifeGerd: will continue zantac 300 mg nightly   3. Constipation:  Will continue colace 200 mg daily;   4. Glaucoma: will continue alphagan  to right eye three times daily; cosopt to both eyes twice daily; pred forte to right eyes four times daily   5. Compression fracture: subacute C6; new T8; and T10 and T11:will continue oxycodone 5 mg every 4 hours as needed; will  ultram to 50 mg every 6 hours as needed    6. Hypertension:  is presently stable is not on medications will monitor      Synthia Innocent NP [facility]  Contact [phone] Monday through Friday 8am- 5pm  After hours call [phone]

## 2015-10-14 ENCOUNTER — Non-Acute Institutional Stay (SKILLED_NURSING_FACILITY): Payer: Medicare Other | Admitting: Adult Health

## 2015-10-14 DIAGNOSIS — F333 Major depressive disorder, recurrent, severe with psychotic symptoms: Secondary | ICD-10-CM | POA: Diagnosis not present

## 2015-10-14 DIAGNOSIS — F449 Dissociative and conversion disorder, unspecified: Secondary | ICD-10-CM | POA: Insufficient documentation

## 2015-10-14 DIAGNOSIS — R410 Disorientation, unspecified: Secondary | ICD-10-CM | POA: Diagnosis not present

## 2015-10-14 DIAGNOSIS — F308 Other manic episodes: Secondary | ICD-10-CM

## 2015-10-14 DIAGNOSIS — R4182 Altered mental status, unspecified: Secondary | ICD-10-CM | POA: Insufficient documentation

## 2015-10-14 LAB — CBC AND DIFFERENTIAL
HCT: 42 % (ref 36–46)
Hemoglobin: 14 g/dL (ref 12.0–16.0)
PLATELETS: 232 10*3/uL (ref 150–399)
WBC: 7.3 10^3/mL

## 2015-10-14 LAB — TSH: TSH: 2.92 u[IU]/mL (ref 0.41–5.90)

## 2015-10-14 LAB — HEPATIC FUNCTION PANEL
ALK PHOS: 112 U/L (ref 25–125)
ALT: 11 U/L (ref 7–35)
AST: 18 U/L (ref 13–35)
BILIRUBIN, TOTAL: 0.6 mg/dL

## 2015-10-14 LAB — BASIC METABOLIC PANEL
BUN: 15 mg/dL (ref 4–21)
CREATININE: 0.8 mg/dL (ref 0.5–1.1)
GLUCOSE: 111 mg/dL
Potassium: 4.1 mmol/L (ref 3.4–5.3)
Sodium: 142 mmol/L (ref 137–147)

## 2015-10-14 MED ORDER — ACETAMINOPHEN 325 MG PO TABS: 650.0000 mg | ORAL_TABLET | Freq: Three times a day (TID) | ORAL | Status: DC

## 2015-10-14 MED ORDER — QUETIAPINE FUMARATE 25 MG PO TABS: 12.5000 mg | ORAL_TABLET | Freq: Every day | ORAL | Status: DC

## 2015-10-14 MED ORDER — VENLAFAXINE HCL 100 MG PO TABS: 100.0000 mg | ORAL_TABLET | Freq: Two times a day (BID) | ORAL | Status: DC

## 2015-10-14 MED ORDER — QUETIAPINE FUMARATE 25 MG PO TABS: 25.0000 mg | ORAL_TABLET | Freq: Every day | ORAL | Status: DC

## 2015-10-14 NOTE — Progress Notes (Signed)
Patient ID: Ashley Swanson, female   DOB: 1928-12-01, 82 y.o.   MRN: [id number]    Facility[facility]      Allergies  Allergen Reactions  . Ciprofloxacin     unknown  . Enablex [Darifenacin Hydrobromide Er]     unknown  . Macrobid [employer] Macro]     unknown  . Metronidazole And Related     unknown  . Reclast [Zoledronic Acid]     unknown  . Sulfa Antibiotics     unknown    Chief Complaint  Patient presents with  . Acute Visit    patient status     HPI:  Staff reports that she is paranoid; delusional; fearful and anxious. She feels as though her son has been locked in a closet with a dog and has been killed; and she is hearing his last moments over and over from the recording made. Staff reports that she has been confused for the past day or so. A UA was performed and is negative.    Past Medical History  Diagnosis Date  . Hyperlipidemia   . Osteoporosis   . Stasis dermatitis   . Diverticulosis   . Cataract   . Thyroid disease     hypothyroid  . GERD (gastroesophageal reflux disease)   . Hypertension   . Depression     Past Surgical History  Procedure Laterality Date  . Cholecystectomy    . Abdominal hysterectomy    . Tubal ligation    . Breast surgery      biopsy  . Tonsilectomy/adenoidectomy with myringotomy    . Cataract extraction    . Appendectomy    . Vertebrolplasty      VITAL SIGNS BP 132/69 mmHg  Pulse 89  Temp(Src) 97.5 F (36.4 C)  Ht 4' 7.9" (1.42 m)  Wt 103 lb 3.2 oz (46.811 kg)  BMI 23.22 kg/m2  SpO2 95%  Patient's Medications  New Prescriptions   No medications on file  Previous Medications   ACETAMINOPHEN (TYLENOL) 325 MG TABLET    Take 650 mg by mouth every 8 (eight) hours as needed for moderate pain.   ALBUTEROL (PROVENTIL) (2.5 MG/3ML) 0.083% NEBULIZER SOLUTION    Take 2.5 mg by nebulization every 4 (four) hours as needed for wheezing or shortness of breath.   ALUMINUM-MAGNESIUM HYDROXIDE-SIMETHICONE  (MAALOX) 200-200-20 MG/5ML SUSP    Take 15 mLs by mouth every 4 (four) hours as needed (reflux).   BENZOCAINE (ANBESOL) 10 % MUCOSAL GEL    Use as directed 1 application in the mouth or throat as needed for mouth pain.   BISACODYL (DULCOLAX) 10 MG SUPPOSITORY    Place 10 mg rectally as needed for moderate constipation.   BRIMONIDINE (ALPHAGAN) 0.2 % OPHTHALMIC SOLUTION    Place 1 drop into the right eye 3 (three) times daily.   CALCIUM CARBONATE (OS-CAL - DOSED IN MG OF ELEMENTAL CALCIUM) 1250 (500 CA) MG TABLET    Take 1 tablet by mouth daily with breakfast.   DOCUSATE SODIUM (COLACE) 100 MG CAPSULE    Take 200 mg by mouth every morning.   DORZOLAMIDE-TIMOLOL (COSOPT) 22.3-6.8 MG/ML OPHTHALMIC SOLUTION    Place 1 drop into both eyes 2 (two) times daily.   effexor 100 mg twice daily    FOOD THICKENER (THICK IT) POWD    Take 1 Container by mouth as needed. HONEY   MELATONIN 10 MG CAPS    Take 10 mg by mouth at bedtime.   MULTIPLE VITAMIN (  MULTIVITAMIN WITH MINERALS) TABS TABLET    Take 1 tablet by mouth daily.   OMEGA-3 FATTY ACIDS (FISH OIL) 1000 MG CAPS    Take 1 capsule by mouth daily.   OXYCODONE (OXY IR/ROXICODONE) 5 MG IMMEDIATE RELEASE TABLET    Take 1 tablet (5 mg total) by mouth every 4 (four) hours as needed for moderate pain or severe pain.   OXYGEN    Inhale 2 L/min into the lungs as needed (low o2 levels).   PREDNISOLONE ACETATE (PRED FORTE) 1 % OPHTHALMIC SUSPENSION    Place 1 drop into the right eye 4 (four) times daily.   RANITIDINE (ZANTAC) 300 MG TABLET    Take 300 mg by mouth at bedtime.   TRAMADOL (ULTRAM) 50 MG TABLET    Take 1 tablet (50 mg total) by mouth 4 (four) times daily.   VITAMIN B-12 (CYANOCOBALAMIN) 1000 MCG TABLET    Take 1,000 mcg by mouth daily.  Modified Medications   No medications on file  Discontinued Medications   No medications on file     SIGNIFICANT DIAGNOSTIC EXAMS   08-06-15: chest x-ray; 1. No acute abnormality. 2. Mild cardiomegaly. 3. Mild  linear scarring at the left lung base.   08-06-15: ct of head and cervical spine: 1. No acute intracranial pathology.2. Nondisplaced fracture of the left inferior articulating facet of C6. There is a sclerotic margin on either side of the fracture suggesting a subacute or chronic injury. There is no other fracture or subluxation of the cervical spine. If there is further clinical concern, recommend MRI. There is surgical follow-up is recommended.  08-06-15: ct of thoracic spine: . Compression fractures of T8, T10 and T11. T10 and T11 are chronic, with the T8 fracture being of unclear chronicity.  08-31-15: EKG sinus bradycardia with first degree AV block   LABS REVIEWED:   08-06-15: wbc 8.5; hgb 12.9; hct 41.7; mcv 96.3; plt 192; glucose 107; bun 18; creat 0.81; k+ 3.7; na++143 08-08-15: wbc 7.5; hgb 13.0; hct 42.9; mcv 99.8; plt 194;  08-10-15: wbc 5.4; hgb 12.1; hct 39.3; mcv 97.0; plt 191; glucose 96; bun 16; creat 0.64; k+ 4.3; na++139  08-12-15: glucose 97; bun 15; creat 0.73; k+ 4.5; na++141 08-19-15: glucose 83; bun 15; creat 0.71; k+ 4.6; na++143     Review of Systems  Unable to perform ROS: dementia     Physical Exam  Vitals reviewed. Constitutional: No distress.  Eyes: Conjunctivae are normal.  Neck: Neck supple. No JVD present. No thyromegaly present.  Cardiovascular: Normal rate, regular rhythm and intact distal pulses.   Respiratory: Effort normal and breath sounds normal. No respiratory distress. She has no wheezes.  GI: Soft. Bowel sounds are normal. She exhibits no distension. There is no tenderness.  Musculoskeletal: She exhibits no edema.  Able to move all extremities Significant kyphosis     Lymphadenopathy:    She has no cervical adenopathy.  Neurological: She is alert.  Skin: Skin is warm and dry. She is not diaphoretic.  Psychiatric: anxious; crying; frightened.       ASSESSMENT/ PLAN:  1.  Psychosis 2. Depression and anxiety 3.  Altered mental  status   The psychiatric NP has seen and will start her on seroquel 25 mg nightly  Will check cbc; cmp; tsh; vitamin B12; folate Will get a stat chest x-ray; to look at infectious process Will continue to monitor her status  I am concerned about her having a serotonin overload with the effexor and ultram.  I will stop the ultram; if her labs are normal; will need to consider stopping the effexor and possibly starting Viibyrd.   Will begin tylenol 650 mg three times daily    Time spent with patient  30  minutes >50% time spent counseling; reviewing medical record; tests; labs; and developing future plan of care   Synthia Innocent NP [facility]  Contact [phone] Monday through Friday 8am- 5pm  After hours call [phone]

## 2015-10-24 ENCOUNTER — Non-Acute Institutional Stay (SKILLED_NURSING_FACILITY): Payer: Medicare Other | Admitting: Adult Health

## 2015-10-24 DIAGNOSIS — J069 Acute upper respiratory infection, unspecified: Secondary | ICD-10-CM | POA: Diagnosis not present

## 2015-10-25 ENCOUNTER — Non-Acute Institutional Stay (SKILLED_NURSING_FACILITY): Payer: Medicare Other | Admitting: Internal Medicine

## 2015-10-25 ENCOUNTER — Encounter: Payer: Self-pay | Admitting: Internal Medicine

## 2015-10-25 DIAGNOSIS — R062 Wheezing: Secondary | ICD-10-CM | POA: Diagnosis not present

## 2015-10-25 DIAGNOSIS — K5909 Other constipation: Secondary | ICD-10-CM

## 2015-10-25 DIAGNOSIS — S22000S Wedge compression fracture of unspecified thoracic vertebra, sequela: Secondary | ICD-10-CM | POA: Diagnosis not present

## 2015-10-25 DIAGNOSIS — K5900 Constipation, unspecified: Secondary | ICD-10-CM | POA: Diagnosis not present

## 2015-10-25 DIAGNOSIS — J069 Acute upper respiratory infection, unspecified: Secondary | ICD-10-CM

## 2015-10-25 DIAGNOSIS — Z9981 Dependence on supplemental oxygen: Secondary | ICD-10-CM

## 2015-10-25 DIAGNOSIS — F333 Major depressive disorder, recurrent, severe with psychotic symptoms: Secondary | ICD-10-CM | POA: Diagnosis not present

## 2015-10-25 DIAGNOSIS — K219 Gastro-esophageal reflux disease without esophagitis: Secondary | ICD-10-CM

## 2015-10-25 DIAGNOSIS — I10 Essential (primary) hypertension: Secondary | ICD-10-CM

## 2015-10-25 NOTE — Progress Notes (Signed)
Patient ID: Ashley Swanson, female   DOB: [birth year], 82 y.o.   MRN: [id number]    DATE:10/25/15  Location:  [place]: SNF [phone])   Extended Emergency Contact Information Primary Emergency Contact: [facility] Address: [address]          [place], [place] [address] Johnnette Litter of [place] Phone: [phone] Mobile Phone: [phone] Relation: Daughter Secondary Emergency Contact: [place], [place] of [place] Phone: [phone] Mobile Phone: [phone] Relation: Son  Advanced Directive information  DNR  Chief Complaint  Patient presents with  . Medical Management of Chronic Issues    HPI:  82 yo female seen today for f/u. She is a poor historian due to psych issues. Hx obtained from chart. She c/o mid back and left sided rib pain. She has URI and was started on Augmentin, mucinex, floraster and duoneb over the weekend. She will complete tx 2/2nd, She has occasional SOB and takes duoneb prn.  No recent fall. No nursing issues  Anxiety and depression - stable on lexapro, melatonin  GERD - stable on zantac  Constipation - stable on colace  Glaucoma  Stable on alphagan, cosopt and pred forte  Compression vertebral fracture - subacute C6; new T8; and T10 and T11. Pain stable on oxycodone 5 mg every 4 hours as needed and ultram 50 mg every 6 hours as needed    Hypertension - BP diet controlled  Past Medical History  Diagnosis Date  . Hyperlipidemia   . Osteoporosis   . Stasis dermatitis   . Diverticulosis   . Cataract   . Thyroid disease     hypothyroid  . GERD (gastroesophageal reflux disease)   . Hypertension   . Depression     Past Surgical History  Procedure Laterality Date  . Cholecystectomy    . Abdominal hysterectomy    . Tubal ligation    . Breast surgery      biopsy  . Tonsilectomy/adenoidectomy with myringotomy    . Cataract extraction    . Appendectomy      . Vertebrolplasty      Patient Care Team: Drake Leach, MD as PCP - General (Internal Medicine)  Social History   Social History  . Marital Status: Widowed    Spouse Name: N/A  . Number of Children: N/A  . Years of Education: N/A   Occupational History  . Not on file.   Social History Main Topics  . Smoking status: Former Research scientist (life sciences)  . Smokeless tobacco: Not on file  . Alcohol Use: Yes  . Drug Use: Not on file  . Sexual Activity: Not on file   Other Topics Concern  . Not on file   Social History Narrative     reports that she has quit smoking. She does not have any smokeless tobacco history on file. She reports that she drinks alcohol. Her drug history is not on file.   There is no immunization history on file for this patient.  Allergies  Allergen Reactions  . Ciprofloxacin     unknown  . Enablex [Darifenacin Hydrobromide Er]     unknown  . Macrobid [employer] Macro]     unknown  . Metronidazole And Related     unknown  . Reclast [Zoledronic Acid]     unknown  . Sulfa Antibiotics     unknown    Medications: Patient's Medications  New Prescriptions  No medications on file  Previous Medications   ACETAMINOPHEN (TYLENOL) 325 MG TABLET    Take 2 tablets (650 mg total) by mouth 3 (three) times daily.   ALBUTEROL (PROVENTIL) (2.5 MG/3ML) 0.083% NEBULIZER SOLUTION    Take 2.5 mg by nebulization every 4 (four) hours as needed for wheezing or shortness of breath.   ALUMINUM-MAGNESIUM HYDROXIDE-SIMETHICONE (MAALOX) 263-785-88 MG/5ML SUSP    Take 15 mLs by mouth every 4 (four) hours as needed (reflux).   BENZOCAINE (ANBESOL) 10 % MUCOSAL GEL    Use as directed 1 application in the mouth or throat as needed for mouth pain.   BISACODYL (DULCOLAX) 10 MG SUPPOSITORY    Place 10 mg rectally as needed for moderate constipation.   BRIMONIDINE (ALPHAGAN) 0.2 % OPHTHALMIC SOLUTION    Place 1 drop into the right eye 3 (three) times daily.   CALCIUM CARBONATE  (OS-CAL - DOSED IN MG OF ELEMENTAL CALCIUM) 1250 (500 CA) MG TABLET    Take 1 tablet by mouth daily with breakfast.   DOCUSATE SODIUM (COLACE) 100 MG CAPSULE    Take 200 mg by mouth every morning.   DORZOLAMIDE-TIMOLOL (COSOPT) 22.3-6.8 MG/ML OPHTHALMIC SOLUTION    Place 1 drop into both eyes 2 (two) times daily.   FOOD THICKENER (THICK IT) POWD    Take 1 Container by mouth as needed. HONEY   MELATONIN 10 MG CAPS    Take 10 mg by mouth at bedtime.   MULTIPLE VITAMIN (MULTIVITAMIN WITH MINERALS) TABS TABLET    Take 1 tablet by mouth daily.   OMEGA-3 FATTY ACIDS (FISH OIL) 1000 MG CAPS    Take 1 capsule by mouth daily.   OXYCODONE (OXY IR/ROXICODONE) 5 MG IMMEDIATE RELEASE TABLET    Take 1 tablet (5 mg total) by mouth every 4 (four) hours as needed for moderate pain or severe pain.   OXYGEN    Inhale 2 L/min into the lungs as needed (low o2 levels).   PREDNISOLONE ACETATE (PRED FORTE) 1 % OPHTHALMIC SUSPENSION    Place 1 drop into the right eye 4 (four) times daily.   QUETIAPINE (SEROQUEL) 25 MG TABLET    Take 1 tablet (25 mg total) by mouth at bedtime.   RANITIDINE (ZANTAC) 300 MG TABLET    Take 300 mg by mouth at bedtime.   VENLAFAXINE (EFFEXOR) 100 MG TABLET    Take 1 tablet (100 mg total) by mouth 2 (two) times daily.   VITAMIN B-12 (CYANOCOBALAMIN) 1000 MCG TABLET    Take 1,000 mcg by mouth daily.  Modified Medications   No medications on file  Discontinued Medications   No medications on file    Review of Systems  Unable to perform ROS: Psychiatric disorder    Filed Vitals:   10/25/15 1532  BP: 130/66  Pulse: 78  Temp: 97.5 F (36.4 C)  Weight: 103 lb 3.2 oz (46.811 kg)  SpO2: 93%   Body mass index is 23.22 kg/(m^2).  Physical Exam  Constitutional: She appears well-developed.  Frail appearing in MAD, sitting in w/c,  O2 intact  HENT:  Mouth/Throat: Oropharynx is clear and moist. No oropharyngeal exudate.  Eyes: Pupils are equal, round, and reactive to light. No scleral  icterus.  Neck: Neck supple. Carotid bruit is not present. No tracheal deviation present.  Cardiovascular: Normal rate and intact distal pulses.  An irregular rhythm present. Exam reveals no gallop and no friction rub.   Murmur (1/6 SEM) heard. No LE edema b/l. no calf TTP.  Pulmonary/Chest: Effort normal. No stridor. No respiratory distress. She has wheezes (end expiratory b/l). She has no rales.  Abdominal: Soft. Bowel sounds are normal. She exhibits no distension and no mass. There is no hepatomegaly. There is no tenderness. There is no rebound and no guarding.  Musculoskeletal: She exhibits edema and tenderness (thoracic spinous process; lateral left CW).  Severe thoracic kyphosis   Lymphadenopathy:    She has no cervical adenopathy.  Neurological: She is alert.  Skin: Skin is warm and dry. No rash noted.  Psychiatric: She has a normal mood and affect. Her behavior is normal.     Labs reviewed: Admission on 08/06/2015, Discharged on 08/10/2015  Component Date Value Ref Range Status  . Sodium 08/06/2015 143  135 - 145 mmol/L Final  . Potassium 08/06/2015 3.7  3.5 - 5.1 mmol/L Final  . Chloride 08/06/2015 107  101 - 111 mmol/L Final  . CO2 08/06/2015 31  22 - 32 mmol/L Final  . Glucose, Bld 08/06/2015 107* 65 - 99 mg/dL Final  . BUN 08/06/2015 18  6 - 20 mg/dL Final  . Creatinine, Ser 08/06/2015 0.81  0.44 - 1.00 mg/dL Final  . Calcium 08/06/2015 9.1  8.9 - 10.3 mg/dL Final  . GFR calc non Af Amer 08/06/2015 >60  >60 mL/min Final  . GFR calc Af Amer 08/06/2015 >60  >60 mL/min Final   Comment: (NOTE) The eGFR has been calculated using the CKD EPI equation. This calculation has not been validated in all clinical situations. eGFR's persistently <60 mL/min signify possible Chronic Kidney Disease.   . Anion gap 08/06/2015 5  5 - 15 Final  . WBC 08/06/2015 8.5  4.0 - 10.5 K/uL Final  . RBC 08/06/2015 4.33  3.87 - 5.11 MIL/uL Final  . Hemoglobin 08/06/2015 12.9  12.0 - 15.0 g/dL  Final  . HCT 08/06/2015 41.7  36.0 - 46.0 % Final  . MCV 08/06/2015 96.3  78.0 - 100.0 fL Final  . MCH 08/06/2015 29.8  26.0 - 34.0 pg Final  . MCHC 08/06/2015 30.9  30.0 - 36.0 g/dL Final  . RDW 08/06/2015 13.7  11.5 - 15.5 % Final  . Platelets 08/06/2015 192  150 - 400 K/uL Final  . Neutrophils Relative % 08/06/2015 67   Final  . Neutro Abs 08/06/2015 5.7  1.7 - 7.7 K/uL Final  . Lymphocytes Relative 08/06/2015 19   Final  . Lymphs Abs 08/06/2015 1.6  0.7 - 4.0 K/uL Final  . Monocytes Relative 08/06/2015 12   Final  . Monocytes Absolute 08/06/2015 1.0  0.1 - 1.0 K/uL Final  . Eosinophils Relative 08/06/2015 2   Final  . Eosinophils Absolute 08/06/2015 0.2  0.0 - 0.7 K/uL Final  . Basophils Relative 08/06/2015 0   Final  . Basophils Absolute 08/06/2015 0.0  0.0 - 0.1 K/uL Final  . WBC 08/08/2015 7.5  4.0 - 10.5 K/uL Final  . RBC 08/08/2015 4.30  3.87 - 5.11 MIL/uL Final  . Hemoglobin 08/08/2015 13.0  12.0 - 15.0 g/dL Final  . HCT 08/08/2015 42.9  36.0 - 46.0 % Final  . MCV 08/08/2015 99.8  78.0 - 100.0 fL Final  . MCH 08/08/2015 30.2  26.0 - 34.0 pg Final  . MCHC 08/08/2015 30.3  30.0 - 36.0 g/dL Final  . RDW 08/08/2015 13.8  11.5 - 15.5 % Final  . Platelets 08/08/2015 194  150 - 400 K/uL Final  . Sodium 08/08/2015 139  135 - 145 mmol/L Final  . Potassium  08/08/2015 4.4  3.5 - 5.1 mmol/L Final  . Chloride 08/08/2015 104  101 - 111 mmol/L Final  . CO2 08/08/2015 30  22 - 32 mmol/L Final  . Glucose, Bld 08/08/2015 96  65 - 99 mg/dL Final  . BUN 08/08/2015 24* 6 - 20 mg/dL Final  . Creatinine, Ser 08/08/2015 0.74  0.44 - 1.00 mg/dL Final  . Calcium 08/08/2015 8.8* 8.9 - 10.3 mg/dL Final  . GFR calc non Af Amer 08/08/2015 >60  >60 mL/min Final  . GFR calc Af Amer 08/08/2015 >60  >60 mL/min Final   Comment: (NOTE) The eGFR has been calculated using the CKD EPI equation. This calculation has not been validated in all clinical situations. eGFR's persistently <60 mL/min signify  possible Chronic Kidney Disease.   . Anion gap 08/08/2015 5  5 - 15 Final  . Sodium 08/09/2015 139  135 - 145 mmol/L Final  . Potassium 08/09/2015 4.5  3.5 - 5.1 mmol/L Final  . Chloride 08/09/2015 105  101 - 111 mmol/L Final  . CO2 08/09/2015 30  22 - 32 mmol/L Final  . Glucose, Bld 08/09/2015 103* 65 - 99 mg/dL Final  . BUN 08/09/2015 16  6 - 20 mg/dL Final  . Creatinine, Ser 08/09/2015 0.61  0.44 - 1.00 mg/dL Final  . Calcium 08/09/2015 8.6* 8.9 - 10.3 mg/dL Final  . GFR calc non Af Amer 08/09/2015 >60  >60 mL/min Final  . GFR calc Af Amer 08/09/2015 >60  >60 mL/min Final   Comment: (NOTE) The eGFR has been calculated using the CKD EPI equation. This calculation has not been validated in all clinical situations. eGFR's persistently <60 mL/min signify possible Chronic Kidney Disease.   . Anion gap 08/09/2015 4* 5 - 15 Final  . WBC 08/10/2015 5.4  4.0 - 10.5 K/uL Final  . RBC 08/10/2015 4.05  3.87 - 5.11 MIL/uL Final  . Hemoglobin 08/10/2015 12.1  12.0 - 15.0 g/dL Final  . HCT 08/10/2015 39.3  36.0 - 46.0 % Final  . MCV 08/10/2015 97.0  78.0 - 100.0 fL Final  . MCH 08/10/2015 29.9  26.0 - 34.0 pg Final  . MCHC 08/10/2015 30.8  30.0 - 36.0 g/dL Final  . RDW 08/10/2015 13.4  11.5 - 15.5 % Final  . Platelets 08/10/2015 191  150 - 400 K/uL Final  . Sodium 08/10/2015 139  135 - 145 mmol/L Final  . Potassium 08/10/2015 4.3  3.5 - 5.1 mmol/L Final  . Chloride 08/10/2015 103  101 - 111 mmol/L Final  . CO2 08/10/2015 30  22 - 32 mmol/L Final  . Glucose, Bld 08/10/2015 96  65 - 99 mg/dL Final  . BUN 08/10/2015 16  6 - 20 mg/dL Final  . Creatinine, Ser 08/10/2015 0.64  0.44 - 1.00 mg/dL Final  . Calcium 08/10/2015 8.7* 8.9 - 10.3 mg/dL Final  . GFR calc non Af Amer 08/10/2015 >60  >60 mL/min Final  . GFR calc Af Amer 08/10/2015 >60  >60 mL/min Final   Comment: (NOTE) The eGFR has been calculated using the CKD EPI equation. This calculation has not been validated in all clinical  situations. eGFR's persistently <60 mL/min signify possible Chronic Kidney Disease.   . Anion gap 08/10/2015 6  5 - 15 Final    No results found.   Assessment/Plan   ICD-9-CM ICD-10-CM   1. Acute upper respiratory infection 465.9 J06.9   2. Wheezing 786.07 R06.2   3. Depression, major, recurrent, severe with psychosis (Center) 296.34  F33.3   4. Thoracic compression fracture, sequela 905.1 S22.000S   5. Essential hypertension 401.9 I10   6. Gastroesophageal reflux disease without esophagitis 530.81 K21.9   7. Chronic constipation 564.00 K59.00   8.       Supplemental O2 dependent  Complete augmentin for URI  Cont nebs prn SOB/wheezing  Nutritional supplements as ordered  Cont current meds as ordered  PT as ordered. OT/ST as indicated  Psych services to follow  [place] O2 prn to keep sats >93%. Check qshift  Will follow  Shaunie Boehm S. Perlie Gold  [facility] [address] [place], [place] [address] [phone] Cell (Monday-Friday 8 AM - 5 PM) [phone] After 5 PM and follow prompts

## 2015-10-27 ENCOUNTER — Non-Acute Institutional Stay (SKILLED_NURSING_FACILITY): Payer: Medicare Other | Admitting: Adult Health

## 2015-10-27 DIAGNOSIS — J069 Acute upper respiratory infection, unspecified: Secondary | ICD-10-CM | POA: Diagnosis not present

## 2015-10-27 DIAGNOSIS — F333 Major depressive disorder, recurrent, severe with psychotic symptoms: Secondary | ICD-10-CM

## 2015-11-21 ENCOUNTER — Encounter: Payer: Self-pay | Admitting: Adult Health

## 2015-11-21 ENCOUNTER — Non-Acute Institutional Stay (SKILLED_NURSING_FACILITY): Payer: Medicare Other | Admitting: Adult Health

## 2015-11-21 DIAGNOSIS — S22000S Wedge compression fracture of unspecified thoracic vertebra, sequela: Secondary | ICD-10-CM

## 2015-11-21 DIAGNOSIS — K219 Gastro-esophageal reflux disease without esophagitis: Secondary | ICD-10-CM

## 2015-11-21 DIAGNOSIS — R1314 Dysphagia, pharyngoesophageal phase: Secondary | ICD-10-CM | POA: Insufficient documentation

## 2015-11-21 DIAGNOSIS — K5900 Constipation, unspecified: Secondary | ICD-10-CM

## 2015-11-21 DIAGNOSIS — F333 Major depressive disorder, recurrent, severe with psychotic symptoms: Secondary | ICD-10-CM | POA: Diagnosis not present

## 2015-11-21 DIAGNOSIS — I10 Essential (primary) hypertension: Secondary | ICD-10-CM

## 2015-11-21 DIAGNOSIS — K5909 Other constipation: Secondary | ICD-10-CM

## 2015-11-21 DIAGNOSIS — H409 Unspecified glaucoma: Secondary | ICD-10-CM

## 2015-11-21 DIAGNOSIS — J069 Acute upper respiratory infection, unspecified: Secondary | ICD-10-CM | POA: Insufficient documentation

## 2015-11-21 NOTE — Progress Notes (Signed)
Patient ID: Ashley Swanson, female   DOB: 1929-09-14, 82 y.o.   MRN: [id number]    Facility: [facility]       Allergies  Allergen Reactions  . Ciprofloxacin     unknown  . Effexor [Venlafaxine]     Intolerance  . Enablex [Darifenacin Hydrobromide Er]     unknown  . Macrobid [employer] Macro]     unknown  . Metronidazole And Related     unknown  . Reclast [Zoledronic Acid]     unknown  . Sulfa Antibiotics     unknown    Chief Complaint  Patient presents with  . Acute Visit    cough and congestion      HPI:  Her behaviors have improved since stopping the ultram and beginning seroquel. She is having a cough with congestion present. She tells me that she has clear sputum present. She does have rhonchi and rales present; there are no reports of fever present.. Due to her frail state she will require abt.    Past Medical History  Diagnosis Date  . Hyperlipidemia   . Osteoporosis   . Stasis dermatitis   . Diverticulosis   . Cataract   . Thyroid disease     hypothyroid  . GERD (gastroesophageal reflux disease)   . Hypertension   . Depression     Past Surgical History  Procedure Laterality Date  . Cholecystectomy    . Abdominal hysterectomy    . Tubal ligation    . Breast surgery      biopsy  . Tonsilectomy/adenoidectomy with myringotomy    . Cataract extraction    . Appendectomy    . Vertebrolplasty      VITAL SIGNS BP 130/66 mmHg  Pulse 78  Temp(Src) 97.5 F (36.4 C)  Resp 18  Ht 4' 7.9" (1.42 m)  Wt 103 lb 3.2 oz (46.811 kg)  BMI 23.22 kg/m2  SpO2 93%  Patient's Medications  New Prescriptions   No medications on file  Previous Medications   ACETAMINOPHEN (TYLENOL) 325 MG TABLET    Take 650 mg by mouth every 8 (eight) hours.   ALBUTEROL (PROVENTIL) (2.5 MG/3ML) 0.083% NEBULIZER SOLUTION    Take 2.5 mg by nebulization every 4 (four) hours as needed for wheezing or shortness of breath.   ALUMINUM-MAGNESIUM HYDROXIDE-SIMETHICONE  (MAALOX) 200-200-20 MG/5ML SUSP    Take 15 mLs by mouth every 4 (four) hours as needed (reflux).   BENZOCAINE (ANBESOL) 10 % MUCOSAL GEL    Use as directed 1 application in the mouth or throat every 8 (eight) hours as needed for mouth pain.    BISACODYL (DULCOLAX) 10 MG SUPPOSITORY    Place 10 mg rectally as needed for moderate constipation.   BRIMONIDINE (ALPHAGAN) 0.2 % OPHTHALMIC SOLUTION    Place 1 drop into the right eye 3 (three) times daily.   CALCIUM CARBONATE (OS-CAL - DOSED IN MG OF ELEMENTAL CALCIUM) 1250 (500 CA) MG TABLET    Take 1 tablet by mouth daily with breakfast.   CITALOPRAM (CELEXA) 20 MG TABLET    Take 20 mg by mouth daily.    DIVALPROEX (DEPAKOTE SPRINKLE) 125 MG CAPSULE    Take 125 mg by mouth every morning.   DOCUSATE SODIUM (COLACE) 100 MG CAPSULE    Take 200 mg by mouth every morning.   DORZOLAMIDE-TIMOLOL (COSOPT) 22.3-6.8 MG/ML OPHTHALMIC SOLUTION    Place 1 drop into both eyes 2 (two) times daily.   FOOD THICKENER (THICK IT) POWD  Take 1 Container by mouth as needed. HONEY   GUAIFENESIN (MUCINEX) 600 MG 12 HR TABLET    Take 600 mg by mouth every 12 (twelve) hours as needed (congestion).   HYDROCORTISONE (ANUSOL-HC) 2.5 % RECTAL CREAM    Place 1 application rectally 2 (two) times daily as needed for hemorrhoids or itching.   HYPROMELLOSE 0.4 % SOLN    Apply 1 drop to eye. Instill one drop in right eye four times daily   LORAZEPAM (ATIVAN) 0.5 MG TABLET    Take 0.5 mg by mouth every 12 (twelve) hours as needed.    MELATONIN 10 MG CAPS    Take 10 mg by mouth at bedtime.   MULTIPLE VITAMIN (MULTIVITAMIN WITH MINERALS) TABS TABLET    Take 1 tablet by mouth daily.   OMEGA-3 FATTY ACIDS (FISH OIL) 1000 MG CAPS    Take 1 capsule by mouth daily.   OXYCODONE (OXY IR/ROXICODONE) 5 MG IMMEDIATE RELEASE TABLET    Take 1 tablet (5 mg total) by mouth every 4 (four) hours as needed for moderate pain or severe pain.   OXYGEN    Inhale 2 L/min into the lungs as needed (low o2 levels).    PREDNISOLONE ACETATE (PRED FORTE) 1 % OPHTHALMIC SUSPENSION    Place 1 drop into the right eye 2 (two) times daily.    QUETIAPINE (SEROQUEL) 50 MG TABLET    Take 50 mg by mouth at bedtime.   RANITIDINE (ZANTAC) 300 MG TABLET    Take 300 mg by mouth at bedtime.   VITAMIN B-12 (CYANOCOBALAMIN) 1000 MCG TABLET    Take 1,000 mcg by mouth daily.  Modified Medications   No medications on file  Discontinued Medications   No medications on file     SIGNIFICANT DIAGNOSTIC EXAMS    08-06-15: chest x-ray; 1. No acute abnormality. 2. Mild cardiomegaly. 3. Mild linear scarring at the left lung base.   08-06-15: ct of head and cervical spine: 1. No acute intracranial pathology.2. Nondisplaced fracture of the left inferior articulating facet of C6. There is a sclerotic margin on either side of the fracture suggesting a subacute or chronic injury. There is no other fracture or subluxation of the cervical spine. If there is further clinical concern, recommend MRI. There is surgical follow-up is recommended.  08-06-15: ct of thoracic spine: . Compression fractures of T8, T10 and T11. T10 and T11 are chronic, with the T8 fracture being of unclear chronicity.  08-31-15: EKG sinus bradycardia with first degree AV block  10-24-15: chest x-ray: minimal plate like atelectasis improving right upper lobe.     LABS REVIEWED:   08-06-15: wbc 8.5; hgb 12.9; hct 41.7; mcv 96.3; plt 192; glucose 107; bun 18; creat 0.81; k+ 3.7; na++143 08-08-15: wbc 7.5; hgb 13.0; hct 42.9; mcv 99.8; plt 194;  08-10-15: wbc 5.4; hgb 12.1; hct 39.3; mcv 97.0; plt 191; glucose 96; bun 16; creat 0.64; k+ 4.3; na++139  08-12-15: glucose 97; bun 15; creat 0.73; k+ 4.5; na++141 08-19-15: glucose 83; bun 15; creat 0.71; k+ 4.6; na++143     Review of Systems  Unable to perform ROS: dementia     Physical Exam  Vitals reviewed. Constitutional: No distress.  Eyes: Conjunctivae are normal.  Neck: Neck supple. No JVD present. No  thyromegaly present.  Cardiovascular: Normal rate, regular rhythm and intact distal pulses.   Respiratory: Effort normal and breath sounds normal. No respiratory distress. She has no wheezes.  GI: Soft. Bowel sounds are normal. She  exhibits no distension. There is no tenderness.  Musculoskeletal: She exhibits no edema.  Able to move all extremities Significant kyphosis     Lymphadenopathy:    She has no cervical adenopathy.  Neurological: She is alert.  Skin: Skin is warm and dry. She is not diaphoretic.  Psychiatric: calm today        ASSESSMENT/ PLAN:  1. Upper respiratory infection: will being mucinex twice daily for one week; duoneb every 6 hours for one week then eveyre 6 hours as needed; will begin augmentin 875 mg twice daily for 10 days with florastor twice daily for 2 weeks. Will have her use flutter valve during her waking hours     Synthia Innocent NP [facility]  Contact [phone] Monday through Friday 8am- 5pm  After hours call [phone]

## 2015-11-21 NOTE — Progress Notes (Signed)
Patient ID: Ashley Swanson, female   DOB: 1929/01/21, 82 y.o.   MRN: [id number]    Facility: [facility]       Allergies  Allergen Reactions  . Ciprofloxacin     unknown  . Effexor [Venlafaxine]     Intolerance  . Enablex [Darifenacin Hydrobromide Er]     unknown  . Macrobid [employer] Macro]     unknown  . Metronidazole And Related     unknown  . Reclast [Zoledronic Acid]     unknown  . Sulfa Antibiotics     unknown    Chief Complaint  Patient presents with  . Medical Management of Chronic Issues    Follow up    HPI:    Past Medical History  Diagnosis Date  . Hyperlipidemia   . Osteoporosis   . Stasis dermatitis   . Diverticulosis   . Cataract   . Thyroid disease     hypothyroid  . GERD (gastroesophageal reflux disease)   . Hypertension   . Depression     Past Surgical History  Procedure Laterality Date  . Cholecystectomy    . Abdominal hysterectomy    . Tubal ligation    . Breast surgery      biopsy  . Tonsilectomy/adenoidectomy with myringotomy    . Cataract extraction    . Appendectomy    . Vertebrolplasty      VITAL SIGNS BP 145/80 mmHg  Pulse 72  Temp(Src) 96.4 F (35.8 C) (Oral)  Resp 18  Ht  (1.575 m)  Wt 104 lb (47.174 kg)  BMI 19.02 kg/m2  SpO2 92%  Patient's Medications  New Prescriptions   No medications on file  Previous Medications   ACETAMINOPHEN (TYLENOL) 325 MG TABLET    Take 650 mg by mouth every 8 (eight) hours.   ALBUTEROL (PROVENTIL) (2.5 MG/3ML) 0.083% NEBULIZER SOLUTION    Take 2.5 mg by nebulization every 4 (four) hours as needed for wheezing or shortness of breath.   ALUMINUM-MAGNESIUM HYDROXIDE-SIMETHICONE (MAALOX) 200-200-20 MG/5ML SUSP    Take 15 mLs by mouth every 4 (four) hours as needed (reflux).   BENZOCAINE (ANBESOL) 10 % MUCOSAL GEL    Use as directed 1 application in the mouth or throat every 8 (eight) hours as needed for mouth pain.    BISACODYL (DULCOLAX) 10 MG SUPPOSITORY    Place  10 mg rectally as needed for moderate constipation.   BRIMONIDINE (ALPHAGAN) 0.2 % OPHTHALMIC SOLUTION    Place 1 drop into the right eye 3 (three) times daily.   CALCIUM CARBONATE (OS-CAL - DOSED IN MG OF ELEMENTAL CALCIUM) 1250 (500 CA) MG TABLET    Take 1 tablet by mouth daily with breakfast.   CITALOPRAM (CELEXA) 20 MG TABLET    Take 20 mg by mouth daily.    DIVALPROEX (DEPAKOTE SPRINKLE) 125 MG CAPSULE    Take 125 mg by mouth every morning.   DOCUSATE SODIUM (COLACE) 100 MG CAPSULE    Take 200 mg by mouth every morning.   DORZOLAMIDE-TIMOLOL (COSOPT) 22.3-6.8 MG/ML OPHTHALMIC SOLUTION    Place 1 drop into both eyes 2 (two) times daily.   FOOD THICKENER (THICK IT) POWD    Take 1 Container by mouth as needed. HONEY   GUAIFENESIN (MUCINEX) 600 MG 12 HR TABLET    Take 600 mg by mouth every 12 (twelve) hours as needed (congestion).   HYDROCORTISONE (ANUSOL-HC) 2.5 % RECTAL CREAM    Place 1 application rectally 2 (two) times daily  as needed for hemorrhoids or itching.   HYPROMELLOSE 0.4 % SOLN    Apply 1 drop to eye. Instill one drop in right eye four times daily   IPRATROPIUM-ALBUTEROL (DUONEB) 0.5-2.5 (3) MG/3ML SOLN    Take 3 mLs by nebulization every 6 (six) hours as needed.   LORAZEPAM (ATIVAN) 0.5 MG TABLET    Take 0.5 mg by mouth every 12 (twelve) hours as needed.    MELATONIN 10 MG CAPS    Take 10 mg by mouth at bedtime.   MULTIPLE VITAMIN (MULTIVITAMIN WITH MINERALS) TABS TABLET    Take 1 tablet by mouth daily.   OMEGA-3 FATTY ACIDS (FISH OIL) 1000 MG CAPS    Take 1 capsule by mouth daily.   OXYCODONE (OXY IR/ROXICODONE) 5 MG IMMEDIATE RELEASE TABLET    Take 1 tablet (5 mg total) by mouth every 4 (four) hours as needed for moderate pain or severe pain.   OXYGEN    Inhale 2 L/min into the lungs as needed (low o2 levels).   PREDNISOLONE ACETATE (PRED FORTE) 1 % OPHTHALMIC SUSPENSION    Place 1 drop into the right eye 2 (two) times daily.    QUETIAPINE (SEROQUEL) 50 MG TABLET    Take 50 mg by  mouth at bedtime.   RANITIDINE (ZANTAC) 300 MG TABLET    Take 300 mg by mouth at bedtime.   VITAMIN B-12 (CYANOCOBALAMIN) 1000 MCG TABLET    Take 1,000 mcg by mouth daily.  Modified Medications   No medications on file  Discontinued Medications   ACETAMINOPHEN (TYLENOL) 325 MG TABLET    Take 2 tablets (650 mg total) by mouth 3 (three) times daily.   QUETIAPINE (SEROQUEL) 25 MG TABLET    Take 1 tablet (25 mg total) by mouth at bedtime.   VENLAFAXINE (EFFEXOR) 100 MG TABLET    Take 1 tablet (100 mg total) by mouth 2 (two) times daily.     SIGNIFICANT DIAGNOSTIC EXAMS  08-06-15: chest x-ray; 1. No acute abnormality. 2. Mild cardiomegaly. 3. Mild linear scarring at the left lung base.   08-06-15: ct of head and cervical spine: 1. No acute intracranial pathology.2. Nondisplaced fracture of the left inferior articulating facet of C6. There is a sclerotic margin on either side of the fracture suggesting a subacute or chronic injury. There is no other fracture or subluxation of the cervical spine. If there is further clinical concern, recommend MRI. There is surgical follow-up is recommended.  08-06-15: ct of thoracic spine: . Compression fractures of T8, T10 and T11. T10 and T11 are chronic, with the T8 fracture being of unclear chronicity.  08-31-15: EKG: sinus bradycardia  10-24-15: chest x-ray: minimal plate like atelectasis involving right upper lobe      LABS REVIEWED:   08-06-15: wbc 8.5; hgb 12.9; hct 41.7; mcv 96.3; plt 192; glucose 107; bun 18; creat 0.81; k+ 3.7; na++143 08-08-15: wbc 7.5; hgb 13.0; hct 42.9; mcv 99.8; plt 194;  08-10-15: wbc 5.4; hgb 12.1; hct 39.3; mcv 97.0; plt 191; glucose 96; bun 16; creat 0.64; k+ 4.3; na++139  08-12-15: glucose 97; bun 15; creat 0.73; k+ 4.5; na++141 08-19-15: glucose 83; bun 15; creat 0.71; k+ 4.6; na++143  10-04-15: wbc 7.3; hgb 14.0; hct 41.8; mcv 90.7 plt 232; glucose 111; bun 15; creat 0.75; k+ 4.1; na++142; liver normal albumin 3.7; tsh  2.917l vit B12; 904; folate 11.2     Review of Systems  Unable to perform ROS: dementia     Physical Exam  Vitals reviewed. Constitutional: No distress.  Eyes: Conjunctivae are normal.  Neck: Neck supple. No JVD present. No thyromegaly present.  Cardiovascular: Normal rate, regular rhythm and intact distal pulses.   Respiratory: Effort normal and breath sounds normal. No respiratory distress. She has no wheezes.  GI: Soft. Bowel sounds are normal. She exhibits no distension. There is no tenderness.  Musculoskeletal: She exhibits no edema.  Able to move all extremities    Lymphadenopathy:    She has no cervical adenopathy.  Neurological: She is alert.  Skin: Skin is warm and dry. She is not diaphoretic.  Psychiatric: restless.      ASSESSMENT/ PLAN:  1. Major depression with psychosis: is being followed by psych services; will continue  celexa 20 mg daily ativan 0.5 mg twice daily as needed for anxiety and seroquel 50 mg nightly will continue depakote sprinkles 125 mg daily for mood stabilization take melatonin 10 mg nightly for sleep   2. Gerd: will continue zantac 300 mg nightly   3. Constipation:  Will continue colace 200 mg daily;   4. Glaucoma: will continue alphagan to right eye three times daily; cosopt to both eyes twice daily; pred forte to right eye four times daily will continue hypromellose to right eye four times daily   5. Compression fracture: subacute C6; new T8; and T10 and T11:will continue oxycodone 5 mg every 4 hours as needed;     6. Hypertension: is presently stable is not on medications will monitor   7. Dysphagia: no signs of aspiration present will continue honey thick liquids             Synthia Innocent NP [facility]  Contact [phone] Monday through Friday 8am- 5pm  After hours call [phone]

## 2015-11-21 NOTE — Progress Notes (Signed)
Patient ID: Ashley Swanson, female   DOB: 09/07/1929, 82 y.o.   MRN: [id number]    Facility[facility]       Allergies  Allergen Reactions  . Ciprofloxacin     unknown  . Enablex [Darifenacin Hydrobromide Er]     unknown  . Macrobid [employer] Macro]     unknown  . Metronidazole And Related     unknown  . Reclast [Zoledronic Acid]     unknown  . Sulfa Antibiotics     unknown    Chief Complaint  Patient presents with  . Acute Visit    patient status     HPI:  Sh e is presently taking effexor 100 mg twice daily and seroquel 25 mg nightly. Staff reports that for the past couple of days her hallucinations and delusional thought patterns have gotten worse. Staff reports that she has a poor appetite. She tells me that she never was a big eater. She is restless today and thinks someone is trying to kill her family members.    Past Medical History  Diagnosis Date  . Hyperlipidemia   . Osteoporosis   . Stasis dermatitis   . Diverticulosis   . Cataract   . Thyroid disease     hypothyroid  . GERD (gastroesophageal reflux disease)   . Hypertension   . Depression     Past Surgical History  Procedure Laterality Date  . Cholecystectomy    . Abdominal hysterectomy    . Tubal ligation    . Breast surgery      biopsy  . Tonsilectomy/adenoidectomy with myringotomy    . Cataract extraction    . Appendectomy    . Vertebrolplasty      VITAL SIGNS BP 133/68 mmHg  Pulse 96  Ht 4' 7.9" (1.42 m)  Wt 103 lb 3.2 oz (46.811 kg)  BMI 23.22 kg/m2  SpO2 95%  Patient's Medications  New Prescriptions   No medications on file  Previous Medications   ACETAMINOPHEN (TYLENOL) 325 MG TABLET    Take 650 mg by mouth every 8 (eight) hours.   ALBUTEROL (PROVENTIL) (2.5 MG/3ML) 0.083% NEBULIZER SOLUTION    Take 2.5 mg by nebulization every 4 (four) hours as needed for wheezing or shortness of breath.   ALUMINUM-MAGNESIUM HYDROXIDE-SIMETHICONE (MAALOX) 200-200-20 MG/5ML  SUSP    Take 15 mLs by mouth every 4 (four) hours as needed (reflux).   BENZOCAINE (ANBESOL) 10 % MUCOSAL GEL    Use as directed 1 application in the mouth or throat every 8 (eight) hours as needed for mouth pain.    BISACODYL (DULCOLAX) 10 MG SUPPOSITORY    Place 10 mg rectally as needed for moderate constipation.   BRIMONIDINE (ALPHAGAN) 0.2 % OPHTHALMIC SOLUTION    Place 1 drop into the right eye 3 (three) times daily.   CALCIUM CARBONATE (OS-CAL - DOSED IN MG OF ELEMENTAL CALCIUM) 1250 (500 CA) MG TABLET    Take 1 tablet by mouth daily with breakfast.   effexor 100 mg  100 mg twice daily    DIVALPROEX (DEPAKOTE SPRINKLE) 125 MG CAPSULE    Take 125 mg by mouth every morning.   DOCUSATE SODIUM (COLACE) 100 MG CAPSULE    Take 200 mg by mouth every morning.   DORZOLAMIDE-TIMOLOL (COSOPT) 22.3-6.8 MG/ML OPHTHALMIC SOLUTION    Place 1 drop into both eyes 2 (two) times daily.   FOOD THICKENER (THICK IT) POWD    Take 1 Container by mouth as needed. HONEY  GUAIFENESIN (MUCINEX) 600 MG 12 HR TABLET    Take 600 mg by mouth every 12 (twelve) hours as needed (congestion).   HYDROCORTISONE (ANUSOL-HC) 2.5 % RECTAL CREAM    Place 1 application rectally 2 (two) times daily as needed for hemorrhoids or itching.   HYPROMELLOSE 0.4 % SOLN    Apply 1 drop to eye. Instill one drop in right eye four times daily   IPRATROPIUM-ALBUTEROL (DUONEB) 0.5-2.5 (3) MG/3ML SOLN    Take 3 mLs by nebulization every 6 (six) hours as needed.   LORAZEPAM (ATIVAN) 0.5 MG TABLET    Take 0.5 mg by mouth every 12 (twelve) hours as needed.    MELATONIN 10 MG CAPS    Take 10 mg by mouth at bedtime.   MULTIPLE VITAMIN (MULTIVITAMIN WITH MINERALS) TABS TABLET    Take 1 tablet by mouth daily.   OMEGA-3 FATTY ACIDS (FISH OIL) 1000 MG CAPS    Take 1 capsule by mouth daily.   OXYCODONE (OXY IR/ROXICODONE) 5 MG IMMEDIATE RELEASE TABLET    Take 1 tablet (5 mg total) by mouth every 4 (four) hours as needed for moderate pain or severe pain.    OXYGEN    Inhale 2 L/min into the lungs as needed (low o2 levels).   PREDNISOLONE ACETATE (PRED FORTE) 1 % OPHTHALMIC SUSPENSION    Place 1 drop into the right eye 2 (two) times daily.    QUETIAPINE (SEROQUEL) 50 MG TABLET    Take 25 mg by mouth at bedtime.   RANITIDINE (ZANTAC) 300 MG TABLET    Take 300 mg by mouth at bedtime.   VITAMIN B-12 (CYANOCOBALAMIN) 1000 MCG TABLET    Take 1,000 mcg by mouth daily.  Modified Medications   No medications on file  Discontinued Medications   No medications on file     SIGNIFICANT DIAGNOSTIC EXAMS  08-06-15: chest x-ray; 1. No acute abnormality. 2. Mild cardiomegaly. 3. Mild linear scarring at the left lung base.   08-06-15: ct of head and cervical spine: 1. No acute intracranial pathology.2. Nondisplaced fracture of the left inferior articulating facet of C6. There is a sclerotic margin on either side of the fracture suggesting a subacute or chronic injury. There is no other fracture or subluxation of the cervical spine. If there is further clinical concern, recommend MRI. There is surgical follow-up is recommended.  08-06-15: ct of thoracic spine: . Compression fractures of T8, T10 and T11. T10 and T11 are chronic, with the T8 fracture being of unclear chronicity.  08-31-15: EKG sinus bradycardia with first degree AV block  10-24-15: chest x-ray: minimal plate like atelectasis improving right upper lobe.     LABS REVIEWED:   08-06-15: wbc 8.5; hgb 12.9; hct 41.7; mcv 96.3; plt 192; glucose 107; bun 18; creat 0.81; k+ 3.7; na++143 08-08-15: wbc 7.5; hgb 13.0; hct 42.9; mcv 99.8; plt 194;  08-10-15: wbc 5.4; hgb 12.1; hct 39.3; mcv 97.0; plt 191; glucose 96; bun 16; creat 0.64; k+ 4.3; na++139  08-12-15: glucose 97; bun 15; creat 0.73; k+ 4.5; na++141 08-19-15: glucose 83; bun 15; creat 0.71; k+ 4.6; na++143 10-14-15: wbc 7.3; hgb 14.0; hct 41.8; mcv 9-.7 plt 232; glucose 111; bun 15; creat 0.75; k+ 4.1; na++142; liver normal albumin 3.7; tsh 2.917;  vit B12: 904; folate 11.2      Review of Systems  Unable to perform ROS: dementia     Physical Exam  Vitals reviewed. Constitutional: No distress.  Eyes: Conjunctivae are normal.  Neck:  Neck supple. No JVD present. No thyromegaly present.  Cardiovascular: Normal rate, regular rhythm and intact distal pulses.   Respiratory: Effort normal and breath sounds normal. No respiratory distress. She has no wheezes.  GI: Soft. Bowel sounds are normal. She exhibits no distension. There is no tenderness.  Musculoskeletal: She exhibits no edema.  Able to move all extremities Significant kyphosis     Lymphadenopathy:    She has no cervical adenopathy.  Neurological: She is alert.  Skin: Skin is warm and dry. She is not diaphoretic.  Psychiatric: anxious and upset     ASSESSMENT/ PLAN:  1. Upper respiratory infection: will complete abt and will monitor her status.   2. Major depression with psychosis:  Will stop the effexor and will mark as intolerance; will being celexa 10 mg daily for 5 days then 20 mg daily and will increase her seroquel to 12.5 mg in the AM and 25 mg in the PM      Synthia Innocent NP [facility]  Contact [phone] Monday through Friday 8am- 5pm  After hours call [phone]

## 2015-12-20 ENCOUNTER — Encounter: Payer: Self-pay | Admitting: Adult Health

## 2015-12-20 ENCOUNTER — Non-Acute Institutional Stay (SKILLED_NURSING_FACILITY): Payer: Medicare Other | Admitting: Adult Health

## 2015-12-20 DIAGNOSIS — Z9660 Presence of unspecified orthopedic joint implant: Secondary | ICD-10-CM

## 2015-12-20 DIAGNOSIS — R1314 Dysphagia, pharyngoesophageal phase: Secondary | ICD-10-CM

## 2015-12-20 DIAGNOSIS — K219 Gastro-esophageal reflux disease without esophagitis: Secondary | ICD-10-CM | POA: Diagnosis not present

## 2015-12-20 DIAGNOSIS — I10 Essential (primary) hypertension: Secondary | ICD-10-CM | POA: Diagnosis not present

## 2015-12-20 DIAGNOSIS — F333 Major depressive disorder, recurrent, severe with psychotic symptoms: Secondary | ICD-10-CM | POA: Diagnosis not present

## 2015-12-20 DIAGNOSIS — K5900 Constipation, unspecified: Secondary | ICD-10-CM

## 2015-12-20 DIAGNOSIS — Z9981 Dependence on supplemental oxygen: Secondary | ICD-10-CM

## 2015-12-20 DIAGNOSIS — Z96649 Presence of unspecified artificial hip joint: Secondary | ICD-10-CM

## 2015-12-20 DIAGNOSIS — S22000S Wedge compression fracture of unspecified thoracic vertebra, sequela: Secondary | ICD-10-CM | POA: Diagnosis not present

## 2015-12-20 DIAGNOSIS — K5909 Other constipation: Secondary | ICD-10-CM

## 2015-12-20 LAB — VITAMIN B12
FOLATE: 11.2
Vitamin B-12: 904

## 2015-12-20 NOTE — Progress Notes (Signed)
Patient ID: Ashley Swanson, female   DOB: 29-Jun-1929, 82 y.o.   MRN: [id number]   [facility]       Allergies  Allergen Reactions  . Ciprofloxacin     unknown  . Effexor [Venlafaxine]     Intolerance  . Enablex [Darifenacin Hydrobromide Er]     unknown  . Macrobid [employer] Macro]     unknown  . Metronidazole And Related     unknown  . Nsaids     unknown  . Reclast [Zoledronic Acid]     unknown  . Sulfa Antibiotics     unknown    Chief Complaint  Patient presents with  . Medical Management of Chronic Issues    Follow up    HPI:  She is a long term resident of this facility being seen for the management of her chronic illnesses. Overall her status is stable. She is unable to fully participate in the hpi or ros; but did tell me that she is feeling good. There are no nursing concerns at this time.    Past Medical History  Diagnosis Date  . Hyperlipidemia   . Osteoporosis   . Stasis dermatitis   . Diverticulosis   . Cataract   . Thyroid disease     hypothyroid  . GERD (gastroesophageal reflux disease)   . Hypertension   . Depression     Past Surgical History  Procedure Laterality Date  . Cholecystectomy    . Abdominal hysterectomy    . Tubal ligation    . Breast surgery      biopsy  . Tonsilectomy/adenoidectomy with myringotomy    . Cataract extraction    . Appendectomy    . Vertebrolplasty      VITAL SIGNS BP 145/80 mmHg  Pulse 72  Temp(Src) 98.4 F (36.9 C) (Oral)  Resp 18  Ht  (1.575 m)  Wt 115 lb (52.164 kg)  BMI 21.03 kg/m2  SpO2 96%  Patient's Medications  New Prescriptions   No medications on file  Previous Medications   ACETAMINOPHEN (TYLENOL) 325 MG TABLET    Take 650 mg by mouth every 8 (eight) hours.   ALBUTEROL (PROVENTIL) (2.5 MG/3ML) 0.083% NEBULIZER SOLUTION    Take 2.5 mg by nebulization every 4 (four) hours as needed for wheezing or shortness of breath.   ALUMINUM-MAGNESIUM HYDROXIDE-SIMETHICONE  (MAALOX) 200-200-20 MG/5ML SUSP    Take 15 mLs by mouth every 4 (four) hours as needed (reflux).   BENZOCAINE (ANBESOL) 10 % MUCOSAL GEL    Use as directed 1 application in the mouth or throat every 8 (eight) hours as needed for mouth pain.    BISACODYL (DULCOLAX) 10 MG SUPPOSITORY    Place 10 mg rectally as needed for moderate constipation.   BRIMONIDINE (ALPHAGAN) 0.2 % OPHTHALMIC SOLUTION    Place 1 drop into the right eye 3 (three) times daily.   CALCIUM CARBONATE (OS-CAL - DOSED IN MG OF ELEMENTAL CALCIUM) 1250 (500 CA) MG TABLET    Take 1 tablet by mouth daily with breakfast.   CITALOPRAM (CELEXA) 20 MG TABLET    Take 20 mg by mouth daily.    DIVALPROEX (DEPAKOTE SPRINKLE) 125 MG CAPSULE    Take 125 mg by mouth every morning.   DOCUSATE SODIUM (COLACE) 100 MG CAPSULE    Take 200 mg by mouth every morning.   DORZOLAMIDE-TIMOLOL (COSOPT) 22.3-6.8 MG/ML OPHTHALMIC SOLUTION    Place 1 drop into both eyes 2 (two) times daily.  FOOD THICKENER (THICK IT) POWD    Take 1 Container by mouth as needed. HONEY   GUAIFENESIN (MUCINEX) 600 MG 12 HR TABLET    Take 600 mg by mouth every 12 (twelve) hours as needed (congestion).   IPRATROPIUM-ALBUTEROL (DUONEB) 0.5-2.5 (3) MG/3ML SOLN    Take 3 mLs by nebulization every 6 (six) hours as needed.   LORAZEPAM (ATIVAN) 0.5 MG TABLET    Take 0.5 mg by mouth every 12 (twelve) hours as needed.    MELATONIN 10 MG CAPS    Take 10 mg by mouth at bedtime.   MULTIPLE VITAMIN (MULTIVITAMIN WITH MINERALS) TABS TABLET    Take 1 tablet by mouth daily.   OMEGA-3 FATTY ACIDS (FISH OIL) 1000 MG CAPS    Take 1 capsule by mouth daily.   OXYCODONE (OXY IR/ROXICODONE) 5 MG IMMEDIATE RELEASE TABLET    Take 1 tablet (5 mg total) by mouth every 4 (four) hours as needed for moderate pain or severe pain.   OXYGEN    Inhale 2 L/min into the lungs as needed (low o2 levels).   PREDNISOLONE ACETATE (PRED FORTE) 1 % OPHTHALMIC SUSPENSION    Place 1 drop into the right eye 2 (two) times  daily.    QUETIAPINE (SEROQUEL) 50 MG TABLET    Take 50 mg by mouth at bedtime.   RANITIDINE (ZANTAC) 300 MG TABLET    Take 300 mg by mouth at bedtime.   VITAMIN B-12 (CYANOCOBALAMIN) 1000 MCG TABLET    Take 1,000 mcg by mouth daily.  Modified Medications   No medications on file  Discontinued Medications     SIGNIFICANT DIAGNOSTIC EXAMS  08-06-15: chest x-ray; 1. No acute abnormality. 2. Mild cardiomegaly. 3. Mild linear scarring at the left lung base.   08-06-15: ct of head and cervical spine: 1. No acute intracranial pathology.2. Nondisplaced fracture of the left inferior articulating facet of C6. There is a sclerotic margin on either side of the fracture suggesting a subacute or chronic injury. There is no other fracture or subluxation of the cervical spine. If there is further clinical concern, recommend MRI. There is surgical follow-up is recommended.  08-06-15: ct of thoracic spine: . Compression fractures of T8, T10 and T11. T10 and T11 are chronic, with the T8 fracture being of unclear chronicity.  08-31-15: EKG: sinus bradycardia  10-24-15: chest x-ray: minimal plate like atelectasis involving right upper lobe      LABS REVIEWED:   08-06-15: wbc 8.5; hgb 12.9; hct 41.7; mcv 96.3; plt 192; glucose 107; bun 18; creat 0.81; k+ 3.7; na++143 08-08-15: wbc 7.5; hgb 13.0; hct 42.9; mcv 99.8; plt 194;  08-10-15: wbc 5.4; hgb 12.1; hct 39.3; mcv 97.0; plt 191; glucose 96; bun 16; creat 0.64; k+ 4.3; na++139  08-12-15: glucose 97; bun 15; creat 0.73; k+ 4.5; na++141 08-19-15: glucose 83; bun 15; creat 0.71; k+ 4.6; na++143  10-14-15: wbc 7.3; hgb 14.0; hct 41.8; mcv 90.7 plt 232; glucose 111; bun 15; creat 0.75; k+ 4.1; na++142; liver normal albumin 3.7; tsh 2.917l vit B12; 904; folate 11.2     Review of Systems  Unable to perform ROS: dementia     Physical Exam  Vitals reviewed. Constitutional: No distress.  Eyes: Conjunctivae are normal.  Neck: Neck supple. No JVD present. No  thyromegaly present.  Cardiovascular: Normal rate, regular rhythm and intact distal pulses.   Respiratory: Effort normal and breath sounds normal. No respiratory distress. She has no wheezes.  GI: Soft. Bowel sounds are  normal. She exhibits no distension. There is no tenderness.  Musculoskeletal: She exhibits no edema.  Able to move all extremities    Lymphadenopathy:    She has no cervical adenopathy.  Neurological: She is alert.  Skin: Skin is warm and dry. She is not diaphoretic.  Psychiatric: is calm      ASSESSMENT/ PLAN:  1. Major depression with psychosis: is being followed by psych services; will continue  celexa 20 mg daily ativan 0.5 mg twice daily as needed for anxiety and seroquel 50 mg nightly will continue depakote sprinkles 125 mg daily for mood stabilization take melatonin 10 mg nightly for sleep   2. Gerd: will continue zantac 300 mg nightly   3. Constipation:  Will continue colace 200 mg daily;   4. Glaucoma: will continue alphagan to right eye three times daily; cosopt to both eyes twice daily; pred forte to right eye four times daily will continue hypromellose to right eye four times daily   5. Compression fracture:  C6;  T8; and T10 and T11:will continue oxycodone 5 mg every 4 hours as needed;     6. Hypertension: is presently stable is not on medications will monitor   7. Dysphagia: no signs of aspiration present will continue honey thick liquids    Will check hgb a1c      Synthia Innocent NP [facility]  Contact [phone] Monday through Friday 8am- 5pm  After hours call [phone]

## 2016-01-05 ENCOUNTER — Non-Acute Institutional Stay (SKILLED_NURSING_FACILITY): Payer: Medicare Other | Admitting: Adult Health

## 2016-01-05 DIAGNOSIS — K5900 Constipation, unspecified: Secondary | ICD-10-CM

## 2016-01-05 DIAGNOSIS — R1314 Dysphagia, pharyngoesophageal phase: Secondary | ICD-10-CM | POA: Diagnosis not present

## 2016-01-05 DIAGNOSIS — F449 Dissociative and conversion disorder, unspecified: Secondary | ICD-10-CM

## 2016-01-05 DIAGNOSIS — F333 Major depressive disorder, recurrent, severe with psychotic symptoms: Secondary | ICD-10-CM | POA: Diagnosis not present

## 2016-01-05 DIAGNOSIS — F308 Other manic episodes: Secondary | ICD-10-CM | POA: Diagnosis not present

## 2016-01-05 DIAGNOSIS — K219 Gastro-esophageal reflux disease without esophagitis: Secondary | ICD-10-CM | POA: Diagnosis not present

## 2016-01-05 DIAGNOSIS — I10 Essential (primary) hypertension: Secondary | ICD-10-CM

## 2016-01-05 DIAGNOSIS — Z9981 Dependence on supplemental oxygen: Secondary | ICD-10-CM

## 2016-01-05 DIAGNOSIS — K5909 Other constipation: Secondary | ICD-10-CM

## 2016-01-06 ENCOUNTER — Encounter: Payer: Self-pay | Admitting: Adult Health

## 2016-01-06 NOTE — Progress Notes (Signed)
Patient ID: Ashley Swanson, female   DOB: [birth year], 82 y.o.   MRN: [id number]   Facility[facility]       Allergies  Allergen Reactions  . Ciprofloxacin     unknown  . Effexor [Venlafaxine]     Intolerance  . Enablex [Darifenacin Hydrobromide Er]     unknown  . Macrobid [employer] Macro]     unknown  . Metronidazole And Related     unknown  . Nsaids     unknown  . Reclast [Zoledronic Acid]     unknown  . Sulfa Antibiotics     unknown    Chief Complaint  Patient presents with  . Acute Visit    Nursing concerns    HPI:  She is a long term resident of this facility being for the management of her chronic illnesses. Overall her status is without significant change. She continues to have paranoid thought patterns. She does tell me that she is not sleeping well at night and is wanting trazodone. Per the facility scales she has lose 13 pounds this past month. I am not certain if this weight is accurate; as she does not physically look as though she has lost weight.    Past Medical History  Diagnosis Date  . Hyperlipidemia   . Osteoporosis   . Stasis dermatitis   . Diverticulosis   . Cataract   . Thyroid disease     hypothyroid  . GERD (gastroesophageal reflux disease)   . Hypertension   . Depression     Past Surgical History  Procedure Laterality Date  . Cholecystectomy    . Abdominal hysterectomy    . Tubal ligation    . Breast surgery      biopsy  . Tonsilectomy/adenoidectomy with myringotomy    . Cataract extraction    . Appendectomy    . Vertebrolplasty      VITAL SIGNS BP 145/80 mmHg  Pulse 72  Ht  (1.575 m)  Wt 102 lb 2 oz (46.324 kg)  BMI 18.67 kg/m2  SpO2 95%  Patient's Medications  New Prescriptions   No medications on file  Previous Medications   ACETAMINOPHEN (TYLENOL) 325 MG TABLET    Take 650 mg by mouth every 8 (eight) hours.   ALBUTEROL (PROVENTIL) (2.5 MG/3ML) 0.083% NEBULIZER SOLUTION    Take 2.5 mg by  nebulization every 4 (four) hours as needed for wheezing or shortness of breath.   ALUMINUM-MAGNESIUM HYDROXIDE-SIMETHICONE (MAALOX) 200-200-20 MG/5ML SUSP    Take 15 mLs by mouth every 4 (four) hours as needed (reflux).   BENZOCAINE (ANBESOL) 10 % MUCOSAL GEL    Use as directed 1 application in the mouth or throat every 8 (eight) hours as needed for mouth pain.    BISACODYL (DULCOLAX) 10 MG SUPPOSITORY    Place 10 mg rectally as needed for moderate constipation.   BRIMONIDINE (ALPHAGAN) 0.2 % OPHTHALMIC SOLUTION    Place 1 drop into the right eye 3 (three) times daily.   CALCIUM CARBONATE (OS-CAL - DOSED IN MG OF ELEMENTAL CALCIUM) 1250 (500 CA) MG TABLET    Take 1 tablet by mouth daily with breakfast.   CITALOPRAM (CELEXA) 20 MG TABLET    Take 20 mg by mouth daily.    DIVALPROEX (DEPAKOTE SPRINKLE) 125 MG CAPSULE    Take 125 mg by mouth every morning.   DOCUSATE SODIUM (COLACE) 100 MG CAPSULE    Take 200 mg by mouth every morning.   DORZOLAMIDE-TIMOLOL (COSOPT) 22.3-6.8  MG/ML OPHTHALMIC SOLUTION    Place 1 drop into both eyes 2 (two) times daily.   FOOD THICKENER (THICK IT) POWD    Take 1 Container by mouth as needed. HONEY   GUAIFENESIN (MUCINEX) 600 MG 12 HR TABLET    Take 600 mg by mouth every 12 (twelve) hours as needed (congestion).   IPRATROPIUM-ALBUTEROL (DUONEB) 0.5-2.5 (3) MG/3ML SOLN    Take 3 mLs by nebulization every 6 (six) hours as needed.   LORAZEPAM (ATIVAN) 0.5 MG TABLET    Take 0.5 mg by mouth every 12 (twelve) hours as needed.    MELATONIN 10 MG CAPS    Take 10 mg by mouth at bedtime.   MULTIPLE VITAMIN (MULTIVITAMIN WITH MINERALS) TABS TABLET    Take 1 tablet by mouth daily.   OMEGA-3 FATTY ACIDS (FISH OIL) 1000 MG CAPS    Take 1 capsule by mouth daily.   OXYCODONE (OXY IR/ROXICODONE) 5 MG IMMEDIATE RELEASE TABLET    Take 1 tablet (5 mg total) by mouth every 4 (four) hours as needed for moderate pain or severe pain.   OXYGEN    Inhale 2 L/min into the lungs as needed (low o2  levels).   PREDNISOLONE ACETATE (PRED FORTE) 1 % OPHTHALMIC SUSPENSION    Place 1 drop into the right eye 2 (two) times daily.    QUETIAPINE (SEROQUEL) 50 MG TABLET    Take 50 mg by mouth at bedtime.   RANITIDINE (ZANTAC) 300 MG TABLET    Take 300 mg by mouth at bedtime.   VITAMIN B-12 (CYANOCOBALAMIN) 1000 MCG TABLET    Take 1,000 mcg by mouth daily.  Modified Medications   No medications on file  Discontinued Medications   No medications on file     SIGNIFICANT DIAGNOSTIC EXAMS  08-06-15: chest x-ray; 1. No acute abnormality. 2. Mild cardiomegaly. 3. Mild linear scarring at the left lung base.   08-06-15: ct of head and cervical spine: 1. No acute intracranial pathology.2. Nondisplaced fracture of the left inferior articulating facet of C6. There is a sclerotic margin on either side of the fracture suggesting a subacute or chronic injury. There is no other fracture or subluxation of the cervical spine. If there is further clinical concern, recommend MRI. There is surgical follow-up is recommended.  08-06-15: ct of thoracic spine: . Compression fractures of T8, T10 and T11. T10 and T11 are chronic, with the T8 fracture being of unclear chronicity.  08-31-15: EKG: sinus bradycardia  10-24-15: chest x-ray: minimal plate like atelectasis involving right upper lobe      LABS REVIEWED:   08-06-15: wbc 8.5; hgb 12.9; hct 41.7; mcv 96.3; plt 192; glucose 107; bun 18; creat 0.81; k+ 3.7; na++143 08-08-15: wbc 7.5; hgb 13.0; hct 42.9; mcv 99.8; plt 194;  08-10-15: wbc 5.4; hgb 12.1; hct 39.3; mcv 97.0; plt 191; glucose 96; bun 16; creat 0.64; k+ 4.3; na++139  08-12-15: glucose 97; bun 15; creat 0.73; k+ 4.5; na++141 08-19-15: glucose 83; bun 15; creat 0.71; k+ 4.6; na++143  10-14-15: wbc 7.3; hgb 14.0; hct 41.8; mcv 90.7 plt 232; glucose 111; bun 15; creat 0.75; k+ 4.1; na++142; liver normal albumin 3.7; tsh 2.917l vit B12; 904; folate 11.2     Review of Systems  Unable to perform ROS: dementia      Physical Exam  Vitals reviewed. Constitutional: No distress.  Eyes: Conjunctivae are normal.  Neck: Neck supple. No JVD present. No thyromegaly present.  Cardiovascular: Normal rate, regular rhythm and intact  distal pulses.   Respiratory: Effort normal and breath sounds normal. No respiratory distress. She has no wheezes.  GI: Soft. Bowel sounds are normal. She exhibits no distension. There is no tenderness.  Musculoskeletal: She exhibits no edema.  Able to move all extremities    Lymphadenopathy:    She has no cervical adenopathy.  Neurological: She is alert.  Skin: Skin is warm and dry. She is not diaphoretic.  Psychiatric: is calm      ASSESSMENT/ PLAN:  1. Major depression with psychosis: is being followed by psych services; will continue  celexa 20 mg daily ativan 0.5 mg twice daily as needed for anxiety  will continue depakote sprinkles 125 mg daily for mood stabilization take melatonin 10 mg nightly for sleep will change her seroquel to 25 mg twice daily and wil begin trazodone 25 mg nighty for sleep.   2. Genella Rife: will continue zantac 300 mg nightly   3. Constipation:  Will continue colace 200 mg daily;   4. Glaucoma: will continue alphagan to right eye three times daily; cosopt to both eyes twice daily; pred forte to right eye four times daily will continue hypromellose to right eye four times daily   5. Compression fracture:  C6;  T8; and T10 and T11:will continue oxycodone 5 mg every 4 hours as needed;     6. Hypertension: is presently stable is not on medications will monitor   7. Dysphagia: no signs of aspiration present will continue honey thick liquids   8. Weight loss: is questionable; will have staff weigh her daily for 2 weeks and will monitor her status     Synthia Innocent NP [facility]  Contact [phone] Monday through Friday 8am- 5pm  After hours call [phone]

## 2016-01-23 ENCOUNTER — Non-Acute Institutional Stay (SKILLED_NURSING_FACILITY): Payer: Medicare Other | Admitting: Adult Health

## 2016-01-23 ENCOUNTER — Encounter: Payer: Self-pay | Admitting: Adult Health

## 2016-01-23 DIAGNOSIS — F308 Other manic episodes: Secondary | ICD-10-CM | POA: Diagnosis not present

## 2016-01-23 DIAGNOSIS — F449 Dissociative and conversion disorder, unspecified: Secondary | ICD-10-CM

## 2016-01-23 DIAGNOSIS — K5909 Other constipation: Secondary | ICD-10-CM

## 2016-01-23 DIAGNOSIS — K219 Gastro-esophageal reflux disease without esophagitis: Secondary | ICD-10-CM | POA: Diagnosis not present

## 2016-01-23 DIAGNOSIS — Z9981 Dependence on supplemental oxygen: Secondary | ICD-10-CM

## 2016-01-23 DIAGNOSIS — K5900 Constipation, unspecified: Secondary | ICD-10-CM

## 2016-01-23 DIAGNOSIS — R1314 Dysphagia, pharyngoesophageal phase: Secondary | ICD-10-CM | POA: Diagnosis not present

## 2016-01-23 DIAGNOSIS — F333 Major depressive disorder, recurrent, severe with psychotic symptoms: Secondary | ICD-10-CM

## 2016-01-23 DIAGNOSIS — S22000S Wedge compression fracture of unspecified thoracic vertebra, sequela: Secondary | ICD-10-CM

## 2016-01-23 NOTE — Progress Notes (Signed)
Patient ID: Ashley Swanson, female   DOB: 05-01-1929, 82 y.o.   MRN: [id number]   [facility]ility[facility]       Allergies  Allergen Reactions  . Ciprofloxacin     unknown  . Effexor [Venlafaxine]     Intolerance  . Enablex [Darifenacin Hydrobromide Er]     unknown  . Macrobid [employer] Macro]     unknown  . Metronidazole And Related     unknown  . Nsaids     unknown  . Reclast [Zoledronic Acid]     unknown  . Sulfa Antibiotics     unknown    Chief Complaint  Patient presents with  . Medical Management of Chronic Issues    Follow up    HPI:  She is a long term resident of this facility being seen for the management of her chronic illnesses. Overall there is little change in her status. She continues to have hallucinations; her seroquel does has been recently changed. She does not appear to be in any distress at this time. She cannot fully participate in the hpi or ros; but did tell me that she is feeling good.   Past Medical History  Diagnosis Date  . Hyperlipidemia   . Osteoporosis   . Stasis dermatitis   . Diverticulosis   . Cataract   . Thyroid disease     hypothyroid  . GERD (gastroesophageal reflux disease)   . Hypertension   . Depression     Past Surgical History  Procedure Laterality Date  . Cholecystectomy    . Abdominal hysterectomy    . Tubal ligation    . Breast surgery      biopsy  . Tonsilectomy/adenoidectomy with myringotomy    . Cataract extraction    . Appendectomy    . Vertebrolplasty      VITAL SIGNS BP 110/60 mmHg  Pulse 68  Temp(Src) 97 F (36.1 C) (Oral)  Resp 18  Ht  (1.575 m)  Wt 112 lb 9 oz (51.058 kg)  BMI 20.58 kg/m2  SpO2 94%  Patient's Medications  New Prescriptions   No medications on file  Previous Medications   ACETAMINOPHEN (TYLENOL) 325 MG TABLET    Take 650 mg by mouth every 8 (eight) hours.   ALBUTEROL (PROVENTIL) (2.5 MG/3ML) 0.083% NEBULIZER SOLUTION    Take 2.5 mg by nebulization every  4 (four) hours as needed for wheezing or shortness of breath.   ALUMINUM-MAGNESIUM HYDROXIDE-SIMETHICONE (MAALOX) 200-200-20 MG/5ML SUSP    Take 15 mLs by mouth every 4 (four) hours as needed (reflux).   BENZOCAINE (ANBESOL) 10 % MUCOSAL GEL    Use as directed 1 application in the mouth or throat every 8 (eight) hours as needed for mouth pain.    BISACODYL (DULCOLAX) 10 MG SUPPOSITORY    Place 10 mg rectally as needed for moderate constipation.   BRIMONIDINE (ALPHAGAN) 0.2 % OPHTHALMIC SOLUTION    Place 1 drop into the right eye 3 (three) times daily.   CALCIUM CARBONATE (OS-CAL - DOSED IN MG OF ELEMENTAL CALCIUM) 1250 (500 CA) MG TABLET    Take 1 tablet by mouth daily with breakfast.   CITALOPRAM (CELEXA) 20 MG TABLET    Take 20 mg by mouth daily.    DIVALPROEX (DEPAKOTE SPRINKLE) 125 MG CAPSULE    Take 125 mg by mouth every morning.   DOCUSATE SODIUM (COLACE) 100 MG CAPSULE    Take 200 mg by mouth every morning.   DORZOLAMIDE-TIMOLOL (COSOPT) 22.3-6.8  MG/ML OPHTHALMIC SOLUTION    Place 1 drop into both eyes 2 (two) times daily.   FOOD THICKENER (THICK IT) POWD    Take 1 Container by mouth as needed. HONEY   GUAIFENESIN (MUCINEX) 600 MG 12 HR TABLET    Take 600 mg by mouth every 12 (twelve) hours as needed (congestion).   IPRATROPIUM-ALBUTEROL (DUONEB) 0.5-2.5 (3) MG/3ML SOLN    Take 3 mLs by nebulization every 6 (six) hours as needed.   LORAZEPAM (ATIVAN) 0.5 MG TABLET    Take 0.5 mg by mouth every 12 (twelve) hours as needed.    MAGNESIUM HYDROXIDE (MILK OF MAGNESIA) 400 MG/5ML SUSPENSION    Take 30 mLs by mouth daily as needed for mild constipation.   MELATONIN 10 MG CAPS    Take 10 mg by mouth at bedtime.   MULTIPLE VITAMIN (MULTIVITAMIN WITH MINERALS) TABS TABLET    Take 1 tablet by mouth daily.   OMEGA-3 FATTY ACIDS (FISH OIL) 1000 MG CAPS    Take 1 capsule by mouth daily.   OXYCODONE (OXY IR/ROXICODONE) 5 MG IMMEDIATE RELEASE TABLET    Take 1 tablet (5 mg total) by mouth every 4 (four)  hours as needed for moderate pain or severe pain.   OXYGEN    Inhale 2 L/min into the lungs as needed (low o2 levels).   PREDNISOLONE ACETATE (PRED FORTE) 1 % OPHTHALMIC SUSPENSION    Place 1 drop into the right eye 2 (two) times daily.    QUETIAPINE (SEROQUEL) 25 MG TABLET    Take 25 mg by mouth 2 (two) times daily.   RANITIDINE (ZANTAC) 300 MG TABLET    Take 300 mg by mouth at bedtime.   TRAZODONE (DESYREL) 25 MG TABS TABLET    Take 25 mg by mouth at bedtime.   VITAMIN B-12 (CYANOCOBALAMIN) 1000 MCG TABLET    Take 1,000 mcg by mouth daily.  Modified Medications   No medications on file  Discontinued Medications     SIGNIFICANT DIAGNOSTIC EXAMS  08-06-15: chest x-ray; 1. No acute abnormality. 2. Mild cardiomegaly. 3. Mild linear scarring at the left lung base.   08-06-15: ct of head and cervical spine: 1. No acute intracranial pathology.2. Nondisplaced fracture of the left inferior articulating facet of C6. There is a sclerotic margin on either side of the fracture suggesting a subacute or chronic injury. There is no other fracture or subluxation of the cervical spine. If there is further clinical concern, recommend MRI. There is surgical follow-up is recommended.  08-06-15: ct of thoracic spine: . Compression fractures of T8, T10 and T11. T10 and T11 are chronic, with the T8 fracture being of unclear chronicity.  08-31-15: EKG: sinus bradycardia  10-24-15: chest x-ray: minimal plate like atelectasis involving right upper lobe      LABS REVIEWED:   08-06-15: wbc 8.5; hgb 12.9; hct 41.7; mcv 96.3; plt 192; glucose 107; bun 18; creat 0.81; k+ 3.7; na++143 08-08-15: wbc 7.5; hgb 13.0; hct 42.9; mcv 99.8; plt 194;  08-10-15: wbc 5.4; hgb 12.1; hct 39.3; mcv 97.0; plt 191; glucose 96; bun 16; creat 0.64; k+ 4.3; na++139  08-12-15: glucose 97; bun 15; creat 0.73; k+ 4.5; na++141 08-19-15: glucose 83; bun 15; creat 0.71; k+ 4.6; na++143  10-14-15: wbc 7.3; hgb 14.0; hct 41.8; mcv 90.7 plt 232;  glucose 111; bun 15; creat 0.75; k+ 4.1; na++142; liver normal albumin 3.7; tsh 2.917l vit B12; 904; folate 11.2     Review of Systems  Unable to  perform ROS: dementia     Physical Exam  Vitals reviewed. Constitutional: No distress.  Eyes: Conjunctivae are normal.  Neck: Neck supple. No JVD present. No thyromegaly present.  Cardiovascular: Normal rate, regular rhythm and intact distal pulses.   Respiratory: Effort normal and breath sounds normal. No respiratory distress. She has no wheezes.  GI: Soft. Bowel sounds are normal. She exhibits no distension. There is no tenderness.  Musculoskeletal: She exhibits no edema.  Able to move all extremities    Lymphadenopathy:    She has no cervical adenopathy.  Neurological: She is alert.  Skin: Skin is warm and dry. She is not diaphoretic.  Psychiatric: is calm      ASSESSMENT/ PLAN:  1. Major depression with psychosis: is being followed by psych services; will continue  celexa 20 mg daily ativan 0.5 mg twice daily as needed for anxiety  will continue depakote sprinkles 125 mg daily for mood stabilization take melatonin 10 mg nightly for sleep will continue seroquel  25 mg twice daily and  trazodone 25 mg nighty for sleep.   2. Genella RifeGerd: will continue zantac 300 mg nightly   3. Constipation:  Will continue colace 200 mg daily;   4. Glaucoma: will continue alphagan to right eye three times daily; cosopt to both eyes twice daily; pred forte to right eye four times daily will continue hypromellose to right eye four times daily   5. Compression fracture:  C6;  T8; and T10 and T11:will continue oxycodone 5 mg every 4 hours as needed;     6. Hypertension: is presently stable is not on medications will monitor   7. Dysphagia: no signs of aspiration present will continue honey thick liquids   8. Weight loss: is questionable; will have staff weigh her daily for 2 weeks and will monitor her status           Synthia Innocenteborah Stefanee Mckell NP [facility]  Adult [facility]  Contact [phone] Monday through Friday 8am- 5pm  After hours call [phone]

## 2016-01-30 ENCOUNTER — Encounter: Payer: Self-pay | Admitting: Adult Health

## 2016-01-30 ENCOUNTER — Non-Acute Institutional Stay (SKILLED_NURSING_FACILITY): Payer: Medicare Other | Admitting: Adult Health

## 2016-01-30 DIAGNOSIS — R1314 Dysphagia, pharyngoesophageal phase: Secondary | ICD-10-CM

## 2016-01-30 DIAGNOSIS — F333 Major depressive disorder, recurrent, severe with psychotic symptoms: Secondary | ICD-10-CM

## 2016-01-30 NOTE — Progress Notes (Signed)
Patient ID: Ashley Swanson, female   DOB: 06-02-1929, 82 y.o.   MRN: [id number]    [facility]     CODE STATUS: DNR  Allergies  Allergen Reactions  . Ciprofloxacin     unknown  . Effexor [Venlafaxine]     Intolerance  . Enablex [Darifenacin Hydrobromide Er]     unknown  . Macrobid [employer] Macro]     unknown  . Metronidazole And Related     unknown  . Nsaids     unknown  . Reclast [Zoledronic Acid]     unknown  . Sulfa Antibiotics     unknown    Chief Complaint  Patient presents with  . Acute Visit    HPI:  Staff reports that her psychosis is getting worse. She is now believing that the staff is trying to kill her. Her previous thoughts have centered around her family being killed. Over the weekend she has not taken her medications due to this fear. I have discussed this with York Spaniel. I have educated the staff on how to approach her regarding her medications. She is willing to take nerve pills. If she continues to decline her medications will need to change her to risperdal liquid and possibly  consta   Past Medical History  Diagnosis Date  . Hyperlipidemia   . Osteoporosis   . Stasis dermatitis   . Diverticulosis   . Cataract   . Thyroid disease     hypothyroid  . GERD (gastroesophageal reflux disease)   . Hypertension   . Depression     Past Surgical History  Procedure Laterality Date  . Cholecystectomy    . Abdominal hysterectomy    . Tubal ligation    . Breast surgery      biopsy  . Tonsilectomy/adenoidectomy with myringotomy    . Cataract extraction    . Appendectomy    . Vertebrolplasty     Social History   Social History  . Marital Status: Widowed    Spouse Name: N/A  . Number of Children: N/A  . Years of Education: N/A   Occupational History  . Not on file.   Social History Main Topics  . Smoking status: Former Games developer  . Smokeless tobacco: Not on file  . Alcohol Use: Yes  . Drug Use: Not on file  .  Sexual Activity: Not on file   Other Topics Concern  . Not on file   Social History Narrative    Family History  Problem Relation Age of Onset  . Aortic aneurysm Mother   . Cancer Father     mouth  . Diabetes Neg Hx   . Coronary artery disease Neg Hx      VITAL SIGNS BP 115/60 mmHg  Pulse 66  Temp(Src) 97.5 F (36.4 C) (Oral)  Resp 16  Ht 5\' 2"  (1.575 m)  Wt 110 lb 8 oz (50.122 kg)  BMI 20.21 kg/m2  SpO2 95%  Patient's Medications  New Prescriptions   No medications on file  Previous Medications   ACETAMINOPHEN (TYLENOL) 325 MG TABLET    Take 650 mg by mouth every 8 (eight) hours.   ALBUTEROL (PROVENTIL) (2.5 MG/3ML) 0.083% NEBULIZER SOLUTION    Take 2.5 mg by nebulization every 4 (four) hours as needed for wheezing or shortness of breath.   ALUMINUM-MAGNESIUM HYDROXIDE-SIMETHICONE (MAALOX) 200-200-20 MG/5ML SUSP    Take 15 mLs by mouth every 4 (four) hours as needed (reflux).   BENZOCAINE (ANBESOL) 10 % MUCOSAL GEL  Use as directed 1 application in the mouth or throat every 8 (eight) hours as needed for mouth pain.    BISACODYL (DULCOLAX) 10 MG SUPPOSITORY    Place 10 mg rectally as needed for moderate constipation.   BRIMONIDINE (ALPHAGAN) 0.2 % OPHTHALMIC SOLUTION    Place 1 drop into the right eye 3 (three) times daily.   CALCIUM CARBONATE (OS-CAL - DOSED IN MG OF ELEMENTAL CALCIUM) 1250 (500 CA) MG TABLET    Take 1 tablet by mouth daily with breakfast.   CITALOPRAM (CELEXA) 20 MG TABLET    Take 20 mg by mouth daily.    DIVALPROEX (DEPAKOTE SPRINKLE) 125 MG CAPSULE    Take 125 mg by mouth every morning.   DOCUSATE SODIUM (COLACE) 100 MG CAPSULE    Take 200 mg by mouth every morning.   DORZOLAMIDE-TIMOLOL (COSOPT) 22.3-6.8 MG/ML OPHTHALMIC SOLUTION    Place 1 drop into both eyes 2 (two) times daily.   FOOD THICKENER (THICK IT) POWD    Take 1 Container by mouth as needed. HONEY   GUAIFENESIN (MUCINEX) 600 MG 12 HR TABLET    Take 600 mg by mouth every 12 (twelve) hours  as needed (congestion).   HYDROCORTISONE (ANUSOL-HC) 2.5 % RECTAL CREAM    Place 1 application rectally 2 (two) times daily.   IPRATROPIUM-ALBUTEROL (DUONEB) 0.5-2.5 (3) MG/3ML SOLN    Take 3 mLs by nebulization every 6 (six) hours as needed.   LORAZEPAM (ATIVAN) 0.5 MG TABLET    Take 0.5 mg by mouth 2 (two) times daily.    MAGNESIUM HYDROXIDE (MILK OF MAGNESIA) 400 MG/5ML SUSPENSION    Take 30 mLs by mouth daily as needed for mild constipation.   MELATONIN 10 MG CAPS    Take 10 mg by mouth at bedtime.   MULTIPLE VITAMIN (MULTIVITAMIN WITH MINERALS) TABS TABLET    Take 1 tablet by mouth daily.   OMEGA-3 FATTY ACIDS (FISH OIL) 1000 MG CAPS    Take 1 capsule by mouth daily.   OXYCODONE (OXY IR/ROXICODONE) 5 MG IMMEDIATE RELEASE TABLET    Take 1 tablet (5 mg total) by mouth every 4 (four) hours as needed for moderate pain or severe pain.   OXYGEN    Inhale 2 L/min into the lungs as needed (low o2 levels).   PREDNISOLONE ACETATE (PRED FORTE) 1 % OPHTHALMIC SUSPENSION    Place 1 drop into the right eye 2 (two) times daily.    QUETIAPINE (SEROQUEL) 25 MG TABLET    Take 25 mg by mouth 2 (two) times daily.   RANITIDINE (ZANTAC) 300 MG TABLET    Take 300 mg by mouth at bedtime.   TRAZODONE (DESYREL) 25 MG TABS TABLET    Take 25 mg by mouth at bedtime.   VITAMIN B-12 (CYANOCOBALAMIN) 1000 MCG TABLET    Take 1,000 mcg by mouth daily.  Modified Medications   No medications on file  Discontinued Medications   No medications on file     SIGNIFICANT DIAGNOSTIC EXAMS  08-06-15: chest x-ray; 1. No acute abnormality. 2. Mild cardiomegaly. 3. Mild linear scarring at the left lung base.   08-06-15: ct of head and cervical spine: 1. No acute intracranial pathology.2. Nondisplaced fracture of the left inferior articulating facet of C6. There is a sclerotic margin on either side of the fracture suggesting a subacute or chronic injury. There is no other fracture or subluxation of the cervical spine. If there is  further clinical concern, recommend MRI. There is surgical follow-up  is recommended.  08-06-15: ct of thoracic spine: . Compression fractures of T8, T10 and T11. T10 and T11 are chronic, with the T8 fracture being of unclear chronicity.  08-31-15: EKG: sinus bradycardia  10-24-15: chest x-ray: minimal plate like atelectasis involving right upper lobe      LABS REVIEWED:   08-06-15: wbc 8.5; hgb 12.9; hct 41.7; mcv 96.3; plt 192; glucose 107; bun 18; creat 0.81; k+ 3.7; na++143 08-08-15: wbc 7.5; hgb 13.0; hct 42.9; mcv 99.8; plt 194;  08-10-15: wbc 5.4; hgb 12.1; hct 39.3; mcv 97.0; plt 191; glucose 96; bun 16; creat 0.64; k+ 4.3; na++139  08-12-15: glucose 97; bun 15; creat 0.73; k+ 4.5; na++141 08-19-15: glucose 83; bun 15; creat 0.71; k+ 4.6; na++143  10-14-15: wbc 7.3; hgb 14.0; hct 41.8; mcv 90.7 plt 232; glucose 111; bun 15; creat 0.75; k+ 4.1; na++142; liver normal albumin 3.7; tsh 2.917l vit B12; 904; folate 11.2     Review of Systems  Unable to perform ROS: dementia     Physical Exam  Vitals reviewed. Constitutional: No distress.  Eyes: Conjunctivae are normal.  Neck: Neck supple. No JVD present. No thyromegaly present.  Cardiovascular: Normal rate, regular rhythm and intact distal pulses.   Respiratory: Effort normal and breath sounds normal. No respiratory distress. She has no wheezes.  GI: Soft. Bowel sounds are normal. She exhibits no distension. There is no tenderness.  Musculoskeletal: She exhibits no edema.  Able to move all extremities    Lymphadenopathy:    She has no cervical adenopathy.  Neurological: She is alert.  Skin: Skin is warm and dry. She is not diaphoretic.  Psychiatric: is calm      ASSESSMENT/ PLAN:  1. Major depression with psychosis: is being followed by psych services; will continue  celexa 20 mg daily ativan 0.5 mg twice daily as needed for anxiety  will continue depakote sprinkles 125 mg daily for mood stabilization take melatonin 10 mg  nightly for sleep  Will continue her seroquel at 25 mg in the AM and 50 mg in the PM has trazodone 25 mg nightly as needed for sleep.   2. Dysphagia: no signs of aspiration present will continue honey thick liquids     Time spent with patient  30  minutes >50% time spent counseling; reviewing medical record; tests; labs; and developing future plan of care     Synthia Innocent NP [facility]  Contact [phone] Monday through Friday 8am- 5pm  After hours call [phone]

## 2016-02-14 LAB — HM DIABETES FOOT EXAM

## 2016-02-23 ENCOUNTER — Encounter: Payer: Self-pay | Admitting: Adult Health

## 2016-02-23 ENCOUNTER — Non-Acute Institutional Stay (SKILLED_NURSING_FACILITY): Payer: Medicare Other | Admitting: Adult Health

## 2016-02-23 DIAGNOSIS — S22000S Wedge compression fracture of unspecified thoracic vertebra, sequela: Secondary | ICD-10-CM | POA: Diagnosis not present

## 2016-02-23 DIAGNOSIS — F200 Paranoid schizophrenia: Secondary | ICD-10-CM | POA: Diagnosis not present

## 2016-02-23 DIAGNOSIS — K219 Gastro-esophageal reflux disease without esophagitis: Secondary | ICD-10-CM | POA: Diagnosis not present

## 2016-02-23 DIAGNOSIS — J9611 Chronic respiratory failure with hypoxia: Secondary | ICD-10-CM

## 2016-02-23 DIAGNOSIS — R1314 Dysphagia, pharyngoesophageal phase: Secondary | ICD-10-CM

## 2016-02-23 DIAGNOSIS — K5909 Other constipation: Secondary | ICD-10-CM

## 2016-02-23 DIAGNOSIS — F333 Major depressive disorder, recurrent, severe with psychotic symptoms: Secondary | ICD-10-CM

## 2016-02-23 DIAGNOSIS — K5900 Constipation, unspecified: Secondary | ICD-10-CM | POA: Diagnosis not present

## 2016-02-23 DIAGNOSIS — Z9660 Presence of unspecified orthopedic joint implant: Secondary | ICD-10-CM

## 2016-02-23 DIAGNOSIS — Z96649 Presence of unspecified artificial hip joint: Secondary | ICD-10-CM

## 2016-02-23 DIAGNOSIS — I10 Essential (primary) hypertension: Secondary | ICD-10-CM | POA: Diagnosis not present

## 2016-02-23 NOTE — Progress Notes (Signed)
Patient ID: Ashley Swanson, female   DOB: [birth year], 82 y.o.   MRN: [id number]   [facility] Room Number: 110-A Place of Service:  SNF (31)   CODE STATUS: DNR  Allergies  Allergen Reactions  . Ciprofloxacin     unknown  . Effexor [Venlafaxine]     Intolerance  . Enablex [Darifenacin Hydrobromide Er]     unknown  . Macrobid [employer][Nitrofurantoin Monohyd Macro]     unknown  . Metronidazole And Related     unknown  . Nsaids     unknown  . Reclast [Zoledronic Acid]     unknown  . Sulfa Antibiotics     unknown    Chief Complaint  Patient presents with  . Medical Management of Chronic Issues    Follow up    HPI:  She is a long term resident of this facility being seen for the management of her chronic illnesses.  She cannot fully participate in the hpi or ros; but did not complain of delusions today. She is followed by psych services. There are no nursing concerns at this time.  Her weight is stable at 112 pounds.   Past Medical History  Diagnosis Date  . Hyperlipidemia   . Osteoporosis   . Stasis dermatitis   . Diverticulosis   . Cataract   . Thyroid disease     hypothyroid  . GERD (gastroesophageal reflux disease)   . Hypertension   . Depression     Past Surgical History  Procedure Laterality Date  . Cholecystectomy    . Abdominal hysterectomy    . Tubal ligation    . Breast surgery      biopsy  . Tonsilectomy/adenoidectomy with myringotomy    . Cataract extraction    . Appendectomy    . Vertebrolplasty      Social History   Social History  . Marital Status: Widowed    Spouse Name: N/A  . Number of Children: N/A  . Years of Education: N/A   Occupational History  . Not on file.   Social History Main Topics  . Smoking status: Former Games developermoker  . Smokeless tobacco: Not on file  . Alcohol Use: Yes  . Drug Use: Not on file  . Sexual Activity: Not on file   Other Topics Concern  . Not on file   Social  History Narrative   Family History  Problem Relation Age of Onset  . Aortic aneurysm Mother   . Cancer Father     mouth  . Diabetes Neg Hx   . Coronary artery disease Neg Hx       VITAL SIGNS BP 130/66 mmHg  Pulse 64  Temp(Src) 97.9 F (36.6 C) (Oral)  Resp 18  Ht 5\' 2"  (1.575 m)  Wt 112 lb 3 oz (50.888 kg)  BMI 20.51 kg/m2  SpO2 96%  Patient's Medications  New Prescriptions   No medications on file  Previous Medications   ACETAMINOPHEN (TYLENOL) 325 MG TABLET    Take 650 mg by mouth every 8 (eight) hours.   ALBUTEROL (PROVENTIL) (2.5 MG/3ML) 0.083% NEBULIZER SOLUTION    Take 2.5 mg by nebulization every 4 (four) hours as needed for wheezing or shortness of breath.   ALUMINUM-MAGNESIUM HYDROXIDE-SIMETHICONE (MAALOX) 200-200-20 MG/5ML SUSP    Take 15 mLs by mouth every 4 (four) hours as needed (reflux).   BENZOCAINE (ANBESOL) 10 % MUCOSAL GEL    Use as directed 1 application in the mouth or  throat every 8 (eight) hours as needed for mouth pain.    BISACODYL (DULCOLAX) 10 MG SUPPOSITORY    Place 10 mg rectally as needed for moderate constipation.   BRIMONIDINE (ALPHAGAN) 0.2 % OPHTHALMIC SOLUTION    Place 1 drop into the right eye 3 (three) times daily.   CALCIUM CARBONATE (OS-CAL - DOSED IN MG OF ELEMENTAL CALCIUM) 1250 (500 CA) MG TABLET    Take 1 tablet by mouth daily with breakfast.   CITALOPRAM (CELEXA) 20 MG TABLET    Take 20 mg by mouth daily.    DOCUSATE SODIUM (COLACE) 100 MG CAPSULE    Take 200 mg by mouth every morning.   DORZOLAMIDE-TIMOLOL (COSOPT) 22.3-6.8 MG/ML OPHTHALMIC SOLUTION    Place 1 drop into both eyes 2 (two) times daily.   FOOD THICKENER (THICK IT) POWD    Take 1 Container by mouth as needed. HONEY   GUAIFENESIN (MUCINEX) 600 MG 12 HR TABLET    Take 600 mg by mouth every 12 (twelve) hours as needed (congestion).   HYDROCORTISONE (ANUSOL-HC) 2.5 % RECTAL CREAM    Place 1 application rectally 2 (two) times daily.   IPRATROPIUM-ALBUTEROL (DUONEB) 0.5-2.5  (3) MG/3ML SOLN    Take 3 mLs by nebulization every 6 (six) hours as needed.   LORAZEPAM (ATIVAN) 0.5 MG TABLET    Take 0.5 mg by mouth daily. Give 0.5 mg po every 12 hours prn anxiety   MELATONIN 10 MG CAPS    Take 10 mg by mouth at bedtime.   MULTIPLE VITAMIN (MULTIVITAMIN WITH MINERALS) TABS TABLET    Take 1 tablet by mouth daily.   OXYCODONE (OXY IR/ROXICODONE) 5 MG IMMEDIATE RELEASE TABLET    Take 1 tablet (5 mg total) by mouth every 4 (four) hours as needed for moderate pain or severe pain.   OXYGEN    Inhale 2 L/min into the lungs as needed (low o2 levels).   PREDNISOLONE ACETATE (PRED FORTE) 1 % OPHTHALMIC SUSPENSION    Place 1 drop into the right eye 2 (two) times daily.    QUETIAPINE (SEROQUEL) 25 MG TABLET    Take 50 mg by mouth 2 (two) times daily.    RANITIDINE (ZANTAC) 300 MG TABLET    Take 300 mg by mouth at bedtime.   TRAZODONE (DESYREL) 25 MG TABS TABLET    Take 25 mg by mouth at bedtime.   VITAMIN B-12 (CYANOCOBALAMIN) 1000 MCG TABLET    Take 1,000 mcg by mouth daily.  Modified Medications   No medications on file  Discontinued Medications     SIGNIFICANT DIAGNOSTIC EXAMS  08-06-15: chest x-ray; 1. No acute abnormality. 2. Mild cardiomegaly. 3. Mild linear scarring at the left lung base.   08-06-15: ct of head and cervical spine: 1. No acute intracranial pathology.2. Nondisplaced fracture of the left inferior articulating facet of C6. There is a sclerotic margin on either side of the fracture suggesting a subacute or chronic injury. There is no other fracture or subluxation of the cervical spine. If there is further clinical concern, recommend MRI. There is surgical follow-up is recommended.  08-06-15: ct of thoracic spine: . Compression fractures of T8, T10 and T11. T10 and T11 are chronic, with the T8 fracture being of unclear chronicity.  08-31-15: EKG: sinus bradycardia  10-24-15: chest x-ray: minimal plate like atelectasis involving right upper lobe      LABS  REVIEWED:   08-06-15: wbc 8.5; hgb 12.9; hct 41.7; mcv 96.3; plt 192; glucose 107; bun 18;  creat 0.81; k+ 3.7; na++143 08-08-15: wbc 7.5; hgb 13.0; hct 42.9; mcv 99.8; plt 194;  08-10-15: wbc 5.4; hgb 12.1; hct 39.3; mcv 97.0; plt 191; glucose 96; bun 16; creat 0.64; k+ 4.3; na++139  08-12-15: glucose 97; bun 15; creat 0.73; k+ 4.5; na++141 08-19-15: glucose 83; bun 15; creat 0.71; k+ 4.6; na++143  10-14-15: wbc 7.3; hgb 14.0; hct 41.8; mcv 90.7 plt 232; glucose 111; bun 15; creat 0.75; k+ 4.1; na++142; liver normal albumin 3.7; tsh 2.917 vit B12; 904; folate 11.2     Review of Systems  Unable to perform ROS: dementia     Physical Exam  Vitals reviewed. Constitutional: No distress.  Eyes: Conjunctivae are normal.  Neck: Neck supple. No JVD present. No thyromegaly present.  Cardiovascular: Normal rate, regular rhythm and intact distal pulses.   Respiratory: Effort normal and breath sounds normal. No respiratory distress. She has no wheezes.  GI: Soft. Bowel sounds are normal. She exhibits no distension. There is no tenderness.  Musculoskeletal: She exhibits no edema.  Able to move all extremities    Lymphadenopathy:    She has no cervical adenopathy.  Neurological: She is alert.  Skin: Skin is warm and dry. She is not diaphoretic.  Psychiatric: is calm      ASSESSMENT/ PLAN:  1. Schizophrenia  With  Major depression with psychosis: is being followed by psych services; will continue  celexa 20 mg daily ativan 0.5 mg twice daily as needed for anxiety  Will continue seroquel 50 m twice daily is taking  melatonin 10 mg nightly for sleep and  trazodone 25 mg nighty for sleep.  Is followed by psych services.   2. Genella Rife: will continue zantac 300 mg nightly   3. Constipation:  Will continue colace 200 mg daily;   4. Glaucoma: will continue alphagan to right eye three times daily; cosopt to both eyes twice daily; pred forte to right eye four times daily will continue hypromellose to right  eye four times daily   5. Compression fracture:  C6;  T8; and T10 and T11 (08/2015) :will continue oxycodone 5 mg every 4 hours as needed;     6. Hypertension: is presently stable is not on medications will monitor   7. Dysphagia: no signs of aspiration present will continue honey thick liquids   8. Weight loss: weight is stable at 112 pounds; will continue supplements per facility protocol and will monitor   9. Chronic hypoxia: is on long term 02 therapy at 2 liters.    Will check cbc; cmp; tsh hgb a1c   Synthia Innocent NP [facility]  Contact [phone] Monday through Friday 8am- 5pm  After hours call [phone]

## 2016-02-24 LAB — HEPATIC FUNCTION PANEL
ALT: 7 U/L (ref 7–35)
AST: 14 U/L (ref 13–35)
Alkaline Phosphatase: 54 U/L (ref 25–125)
Bilirubin, Total: 0.3 mg/dL

## 2016-02-24 LAB — CBC AND DIFFERENTIAL
HCT: 34 % — AB (ref 36–46)
HEMOGLOBIN: 11.4 g/dL — AB (ref 12.0–16.0)
Platelets: 192 10*3/uL (ref 150–399)
WBC: 6 10^3/mL

## 2016-02-24 LAB — BASIC METABOLIC PANEL
BUN: 20 mg/dL (ref 4–21)
CREATININE: 0.8 mg/dL (ref 0.5–1.1)
Glucose: 92 mg/dL
POTASSIUM: 4.2 mmol/L (ref 3.4–5.3)
SODIUM: 143 mmol/L (ref 137–147)

## 2016-02-24 LAB — HEMOGLOBIN A1C: Hemoglobin A1C: 5.7

## 2016-02-24 LAB — TSH: TSH: 4.5 u[IU]/mL (ref <=5.90)

## 2016-02-29 ENCOUNTER — Encounter: Payer: Self-pay | Admitting: Adult Health

## 2016-02-29 ENCOUNTER — Non-Acute Institutional Stay (SKILLED_NURSING_FACILITY): Payer: Medicare Other | Admitting: Adult Health

## 2016-02-29 DIAGNOSIS — J988 Other specified respiratory disorders: Secondary | ICD-10-CM | POA: Diagnosis not present

## 2016-02-29 DIAGNOSIS — J9611 Chronic respiratory failure with hypoxia: Secondary | ICD-10-CM | POA: Diagnosis not present

## 2016-02-29 NOTE — Progress Notes (Signed)
Patient ID: Ashley Swanson, female   DOB: [birth year], 82 y.o.   MRN: [id number]   Location:  [facility] Room Number: 110-A Place of Service:  SNF (31)   CODE STATUS: DNR  Allergies  Allergen Reactions  . Ciprofloxacin     unknown  . Effexor [Venlafaxine]     Intolerance  . Enablex [Darifenacin Hydrobromide Er]     unknown  . Macrobid [employer] Macro]     unknown  . Metronidazole And Related     unknown  . Nsaids     unknown  . Reclast [Zoledronic Acid]     unknown  . Sulfa Antibiotics     unknown    Chief Complaint  Patient presents with  . Acute Visit    shortness of breath     HPI:  She was yelling out. She is unable to fully participate in the hpi or ros; but she tells me that her chest hurts across her chest; short of breath and has a cough. She is also worried about being arrested and about her family and if they are still alive. There are no reports of fever present. She looks as though she feels bad.    Past Medical History  Diagnosis Date  . Hyperlipidemia   . Osteoporosis   . Stasis dermatitis   . Diverticulosis   . Cataract   . Thyroid disease     hypothyroid  . GERD (gastroesophageal reflux disease)   . Hypertension   . Depression     Past Surgical History  Procedure Laterality Date  . Cholecystectomy    . Abdominal hysterectomy    . Tubal ligation    . Breast surgery      biopsy  . Tonsilectomy/adenoidectomy with myringotomy    . Cataract extraction    . Appendectomy    . Vertebrolplasty      Social History   Social History  . Marital Status: Widowed    Spouse Name: N/A  . Number of Children: N/A  . Years of Education: N/A   Occupational History  . Not on file.   Social History Main Topics  . Smoking status: Former Games developer  . Smokeless tobacco: Not on file  . Alcohol Use: Yes  . Drug Use: Not on file  . Sexual Activity: Not on file   Other Topics Concern  . Not on file    Social History Narrative   Family History  Problem Relation Age of Onset  . Aortic aneurysm Mother   . Cancer Father     mouth  . Diabetes Neg Hx   . Coronary artery disease Neg Hx       VITAL SIGNS BP 138/65 mmHg  Pulse 70  Temp(Src) 97.2 F (36.2 C) (Oral)  Resp 20  Ht 5\' 2"  (1.575 m)  Wt 112 lb 3 oz (50.888 kg)  BMI 20.51 kg/m2  SpO2 95%  Patient's Medications  New Prescriptions   No medications on file  Previous Medications   ACETAMINOPHEN (TYLENOL) 325 MG TABLET    Take 650 mg by mouth every 8 (eight) hours.   ALBUTEROL (PROVENTIL) (2.5 MG/3ML) 0.083% NEBULIZER SOLUTION    Take 2.5 mg by nebulization every 4 (four) hours as needed for wheezing or shortness of breath.   ALUMINUM-MAGNESIUM HYDROXIDE-SIMETHICONE (MAALOX) 200-200-20 MG/5ML SUSP    Take 15 mLs by mouth every 4 (four) hours as needed (reflux).   BENZOCAINE (ANBESOL) 10 % MUCOSAL GEL    Use as  directed 1 application in the mouth or throat every 8 (eight) hours as needed for mouth pain.    BISACODYL (DULCOLAX) 10 MG SUPPOSITORY    Place 10 mg rectally as needed for moderate constipation.   BRIMONIDINE (ALPHAGAN) 0.2 % OPHTHALMIC SOLUTION    Place 1 drop into the right eye 3 (three) times daily.   CALCIUM CARBONATE (OS-CAL - DOSED IN MG OF ELEMENTAL CALCIUM) 1250 (500 CA) MG TABLET    Take 1 tablet by mouth daily with breakfast.   CITALOPRAM (CELEXA) 20 MG TABLET    Take 20 mg by mouth daily.    DOCUSATE SODIUM (COLACE) 100 MG CAPSULE    Take 200 mg by mouth every morning.   DORZOLAMIDE-TIMOLOL (COSOPT) 22.3-6.8 MG/ML OPHTHALMIC SOLUTION    Place 1 drop into both eyes 2 (two) times daily.   FOOD THICKENER (THICK IT) POWD    Take 1 Container by mouth as needed. HONEY   GUAIFENESIN (MUCINEX) 600 MG 12 HR TABLET    Take 600 mg by mouth every 12 (twelve) hours as needed (congestion).   HYDROCORTISONE (ANUSOL-HC) 2.5 % RECTAL CREAM    Place 1 application rectally 2 (two) times daily.   IPRATROPIUM-ALBUTEROL  (DUONEB) 0.5-2.5 (3) MG/3ML SOLN    Take 3 mLs by nebulization every 6 (six) hours as needed.   LORAZEPAM (ATIVAN) 0.5 MG TABLET    Take 0.5 mg by mouth daily. Give 0.5 mg po every 12 hours prn anxiety   MELATONIN 10 MG CAPS    Take 10 mg by mouth at bedtime.   MULTIPLE VITAMIN (MULTIVITAMIN WITH MINERALS) TABS TABLET    Take 1 tablet by mouth daily.   NUTRITIONAL SUPPLEMENTS (NUTRITIONAL SUPPLEMENT PO)    Take by mouth. Puree texture, Honey consistency Magic cup TID   OXYCODONE (OXY IR/ROXICODONE) 5 MG IMMEDIATE RELEASE TABLET    Take 1 tablet (5 mg total) by mouth every 4 (four) hours as needed for moderate pain or severe pain.   OXYGEN    Inhale 2 L/min into the lungs as needed (low o2 levels).   PREDNISOLONE ACETATE (PRED FORTE) 1 % OPHTHALMIC SUSPENSION    Place 1 drop into the right eye 2 (two) times daily.    QUETIAPINE (SEROQUEL) 25 MG TABLET    Take 50 mg by mouth 2 (two) times daily.    RANITIDINE (ZANTAC) 300 MG TABLET    Take 300 mg by mouth at bedtime.   TRAZODONE (DESYREL) 25 MG TABS TABLET    Take 25 mg by mouth at bedtime.   VITAMIN B-12 (CYANOCOBALAMIN) 1000 MCG TABLET    Take 1,000 mcg by mouth daily.  Modified Medications   No medications on file  Discontinued Medications   No medications on file     SIGNIFICANT DIAGNOSTIC EXAMS  08-06-15: chest x-ray; 1. No acute abnormality. 2. Mild cardiomegaly. 3. Mild linear scarring at the left lung base.   08-06-15: ct of head and cervical spine: 1. No acute intracranial pathology.2. Nondisplaced fracture of the left inferior articulating facet of C6. There is a sclerotic margin on either side of the fracture suggesting a subacute or chronic injury. There is no other fracture or subluxation of the cervical spine. If there is further clinical concern, recommend MRI. There is surgical follow-up is recommended.  08-06-15: ct of thoracic spine: . Compression fractures of T8, T10 and T11. T10 and T11 are chronic, with the T8 fracture  being of unclear chronicity.  08-31-15: EKG: sinus bradycardia  10-24-15: chest  x-ray: minimal plate like atelectasis involving right upper lobe      LABS REVIEWED:   08-06-15: wbc 8.5; hgb 12.9; hct 41.7; mcv 96.3; plt 192; glucose 107; bun 18; creat 0.81; k+ 3.7; na++143 08-08-15: wbc 7.5; hgb 13.0; hct 42.9; mcv 99.8; plt 194;  08-10-15: wbc 5.4; hgb 12.1; hct 39.3; mcv 97.0; plt 191; glucose 96; bun 16; creat 0.64; k+ 4.3; na++139  08-12-15: glucose 97; bun 15; creat 0.73; k+ 4.5; na++141 08-19-15: glucose 83; bun 15; creat 0.71; k+ 4.6; na++143  10-14-15: wbc 7.3; hgb 14.0; hct 41.8; mcv 90.7 plt 232; glucose 111; bun 15; creat 0.75; k+ 4.1; na++142; liver normal albumin 3.7; tsh 2.917 vit B12; 904; folate 11.2     Review of Systems  Unable to perform ROS: dementia     Physical Exam  Vitals reviewed. Constitutional: No distress. frail  Eyes: Conjunctivae are normal.  Neck: Neck supple. No JVD present. No thyromegaly present.  Cardiovascular: Normal rate, regular rhythm and intact distal pulses.   Respiratory: . No respiratory distress. She has no wheezes. breath sounds diminished throughout.   GI: Soft. Bowel sounds are normal. She exhibits no distension. There is no tenderness.  Musculoskeletal: She exhibits no edema.  Able to move all extremities    Lymphadenopathy:    She has no cervical adenopathy.  Neurological: She is alert.  Skin: Skin is warm and dry. She is not diaphoretic.  Psychiatric: is calm      ASSESSMENT/ PLAN:  1. Chronic hypoxia: is on long term 02 therapy at 2 liters.   2. Respiratory infection: will begin augmentin 875 mg twice daily for 2 weeks; will begin tessalon perle 100 mg every 8 hours as needed for cough. Will get a chest x-ray and an EKG will monitor her status.   Will check cbc bmp in AM.    Synthia Innocenteborah Jansel Vonstein NP [facility]  Contact [phone] Monday through Friday 8am- 5pm  After hours call [phone]

## 2016-03-20 ENCOUNTER — Encounter: Payer: Self-pay | Admitting: Internal Medicine

## 2016-03-20 ENCOUNTER — Non-Acute Institutional Stay (SKILLED_NURSING_FACILITY): Payer: Medicare Other | Admitting: Internal Medicine

## 2016-03-20 DIAGNOSIS — J189 Pneumonia, unspecified organism: Secondary | ICD-10-CM | POA: Diagnosis not present

## 2016-03-20 DIAGNOSIS — J9611 Chronic respiratory failure with hypoxia: Secondary | ICD-10-CM | POA: Diagnosis not present

## 2016-03-20 DIAGNOSIS — F200 Paranoid schizophrenia: Secondary | ICD-10-CM | POA: Diagnosis not present

## 2016-03-20 NOTE — Progress Notes (Addendum)
DATE:03/20/16  Location:  [facility]  Nursing Home Room Number: 110 A Place of Service: SNF [phone])   Extended Emergency Contact Information Primary Emergency Contact: [name],[name] Address: [address]          [place], [place] [address] [name] of [place] Home Phone: [phone] Mobile Phone: [phone] Relation: Daughter Secondary Emergency Contact: [name] [place], [place] of [place] Home Phone: [phone] Mobile Phone: [phone] Relation: Son  Advanced Directive information Does patient have an advance directive?: No, Would patient like information on creating an advanced directive?: No - patient declined information DNR Chief Complaint  Patient presents with  . Cough    HPI:  82 yo female long term resident seen today for UR sx's. She completed 2 weeks Augmentin recently for URI. Last night, she began to c/o left ACW pain. (+) cough. No f/c. Uses neb tx prn. She is a poor historian due to schizophrenia. Hx obtained from chart.  Schizophrenia  With  Major depression and psychosis -  followed by psych services; takes  celexa 20 mg daily; ativan 0.5 mg twice daily as needed for anxiety; seroquel 50 m twice daily; melatonin 10 mg nightly for sleep and  trazodone 25 mg nighty for sleep  GERD - stable on zantac 300 mg nightly   Constipation - stable on colace 200 mg daily;    Compression fracture at  C6;  T8; and T10 and T11 (08/2015) - pain stable on oxycodone 5 mg every 4 hours as needed     Chronic respiratory failure with hypoxia - on long term 02 therapy at 2 liters/min     Past Medical History  Diagnosis Date  . Hyperlipidemia   . Osteoporosis   . Stasis dermatitis   . Diverticulosis   . Cataract   . Thyroid disease     hypothyroid  . GERD (gastroesophageal reflux disease)   . Hypertension   . Depression     Past Surgical History  Procedure Laterality Date  . Cholecystectomy    .  Abdominal hysterectomy    . Tubal ligation    . Breast surgery      biopsy  . Tonsilectomy/adenoidectomy with myringotomy    . Cataract extraction    . Appendectomy    . Vertebrolplasty      Patient Care Team: [name], MD as PCP - General (Internal Medicine)  Social History   Social History  . Marital Status: Widowed    Spouse Name: N/A  . Number of Children: N/A  . Years of Education: N/A   Occupational History  . Not on file.   Social History Main Topics  . Smoking status: Former Games developer  . Smokeless tobacco: Not on file  . Alcohol Use: Yes  . Drug Use: Not on file  . Sexual Activity: Not on file   Other Topics Concern  . Not on file   Social History Narrative     reports that she has quit smoking. She does not have any smokeless tobacco history on file. She reports that she drinks alcohol. Her drug history is not on file.  Family History  Problem Relation Age of Onset  . Aortic aneurysm Mother   . Cancer Father     mouth  . Diabetes Neg Hx   . Coronary artery disease Neg Hx    Family Status  Relation Status Death Age  . Mother Deceased   . Father  Deceased      There is no immunization history on file for this patient.  Allergies  Allergen Reactions  . Ciprofloxacin     unknown  . Effexor [Venlafaxine]     Intolerance  . Enablex [Darifenacin Hydrobromide Er]     unknown  . Macrobid [employer][Nitrofurantoin Monohyd Macro]     unknown  . Metronidazole And Related     unknown  . Nsaids     unknown  . Reclast [Zoledronic Acid]     unknown  . Sulfa Antibiotics     unknown    Medications: Patient's Medications  New Prescriptions   No medications on file  Previous Medications   ACETAMINOPHEN (TYLENOL) 325 MG TABLET    Take 650 mg by mouth every 8 (eight) hours.   ALBUTEROL (PROVENTIL) (2.5 MG/3ML) 0.083% NEBULIZER SOLUTION    Take 2.5 mg by nebulization every 4 (four) hours as needed for wheezing or shortness of breath.   ALUMINUM-MAGNESIUM  HYDROXIDE-SIMETHICONE (MAALOX) 200-200-20 MG/5ML SUSP    Take 15 mLs by mouth every 4 (four) hours as needed (reflux).   BENZOCAINE (ANBESOL) 10 % MUCOSAL GEL    Use as directed 1 application in the mouth or throat every 8 (eight) hours as needed for mouth pain.    BENZONATATE (TESSALON) 100 MG CAPSULE    Take 100 mg by mouth 3 (three) times daily as needed for cough.   BISACODYL (DULCOLAX) 10 MG SUPPOSITORY    Place 10 mg rectally as needed for moderate constipation.   BRIMONIDINE (ALPHAGAN) 0.2 % OPHTHALMIC SOLUTION    Place 1 drop into the right eye 3 (three) times daily.   CALCIUM CARBONATE (OS-CAL - DOSED IN MG OF ELEMENTAL CALCIUM) 1250 (500 CA) MG TABLET    Take 1 tablet by mouth daily with breakfast.   CITALOPRAM (CELEXA) 20 MG TABLET    Take 20 mg by mouth daily.    DOCUSATE SODIUM (COLACE) 100 MG CAPSULE    Take 200 mg by mouth every morning.   DORZOLAMIDE-TIMOLOL (COSOPT) 22.3-6.8 MG/ML OPHTHALMIC SOLUTION    Place 1 drop into both eyes 2 (two) times daily.   FOOD THICKENER (THICK IT) POWD    Take 1 Container by mouth as needed. HONEY   GUAIFENESIN (MUCINEX) 600 MG 12 HR TABLET    Take 600 mg by mouth every 12 (twelve) hours as needed (congestion).   HYDROCORTISONE (ANUSOL-HC) 2.5 % RECTAL CREAM    Place 1 application rectally 2 (two) times daily.   HYPROMELLOSE 0.4 % SOLN    Place 1 drop into the right eye 4 (four) times daily.   IPRATROPIUM-ALBUTEROL (DUONEB) 0.5-2.5 (3) MG/3ML SOLN    Take 3 mLs by nebulization every 6 (six) hours as needed.   LORAZEPAM (ATIVAN) 0.5 MG TABLET    Take 0.5 mg by mouth daily. 0.5 mg by mouth every 12 hours as needed for anxiety   MELATONIN 10 MG CAPS    Take 10 mg by mouth at bedtime.   MULTIPLE VITAMIN (MULTIVITAMIN WITH MINERALS) TABS TABLET    Take 1 tablet by mouth daily.   NUTRITIONAL SUPPLEMENTS (NUTRITIONAL SUPPLEMENT PO)    Take by mouth. Puree texture, Honey consistency Magic cup TID   OXYCODONE (OXY IR/ROXICODONE) 5 MG IMMEDIATE RELEASE TABLET     Take 1 tablet (5 mg total) by mouth every 4 (four) hours as needed for moderate pain or severe pain.   OXYGEN    Inhale 2 L/min into the lungs as needed (low o2  levels).   PREDNISOLONE ACETATE (PRED FORTE) 1 % OPHTHALMIC SUSPENSION    Place 1 drop into the right eye 2 (two) times daily.    QUETIAPINE (SEROQUEL) 25 MG TABLET    Take 50 mg by mouth 2 (two) times daily.    RANITIDINE (ZANTAC) 300 MG TABLET    Take 300 mg by mouth at bedtime.   TRAZODONE (DESYREL) 25 MG TABS TABLET    Take 25 mg by mouth at bedtime.   VITAMIN B-12 (CYANOCOBALAMIN) 1000 MCG TABLET    Take 1,000 mcg by mouth daily.  Modified Medications   No medications on file  Discontinued Medications   No medications on file    Review of Systems  Unable to perform ROS: Psychiatric disorder    Filed Vitals:   03/20/16 1039  BP: 122/76  Pulse: 79  Temp: 97 F (36.1 C)  TempSrc: Oral  Resp: 16  Height: 5\' 2"  (1.575 m)  Weight: 113 lb (51.256 kg)   Body mass index is 20.66 kg/(m^2).  Physical Exam  Constitutional: She appears well-developed. No distress.  Lying in bed in NAD, [place] O2 intact; frail appearing  HENT:  oropharynx cobblestoning but no redness or exudate  Neck: Neck supple. No tracheal deviation present.  Cardiovascular: Normal rate, regular rhythm, normal heart sounds and intact distal pulses.  Exam reveals no gallop and no friction rub.   No murmur heard. No LE edema b/l. No calf TTP  Pulmonary/Chest: She has wheezes (b/l end expiratory with prolonged expiratory phase). She exhibits tenderness (CP reproducible).  Musculoskeletal: She exhibits tenderness (multiple TP along sternum; left costal angle stuck up).  Lymphadenopathy:    She has no cervical adenopathy.  Neurological: She is alert.  Skin: Skin is warm and dry. No rash noted.  Psychiatric: She has a normal mood and affect. Her behavior is normal.     Labs reviewed: Nursing Home on 03/20/2016  Component Date Value Ref Range Status  .  Hemoglobin 02/24/2016 11.4* 12.0 - 16.0 g/dL Final  . HCT 19/14/7829 34* 36 - 46 % Final  . Platelets 02/24/2016 192  150 - 399 K/L Final  . WBC 02/24/2016 6.0   Final  . Glucose 02/24/2016 92   Final  . BUN 02/24/2016 20  4 - 21 mg/dL Final  . Creatinine 56/21/3086 0.8  0.5 - 1.1 mg/dL Final  . Potassium 57/84/6962 4.2  3.4 - 5.3 mmol/L Final  . Sodium 02/24/2016 143  137 - 147 mmol/L Final  . Alkaline Phosphatase 02/24/2016 54  25 - 125 U/L Final  . ALT 02/24/2016 7  7 - 35 U/L Final  . AST 02/24/2016 14  13 - 35 U/L Final  . Bilirubin, Total 02/24/2016 0.3   Final  Nursing Home on 02/29/2016  Component Date Value Ref Range Status  . HM Diabetic Foot Exam 02/14/2016 Completed   Final  Nursing Home on 12/20/2015  Component Date Value Ref Range Status  . Vitamin B-12 10/14/2015 904   Final  . Folate 10/14/2015 11.2   Final    No results found.   Assessment/Plan   ICD-9-CM ICD-10-CM   1. HCAP (healthcare-associated pneumonia) 486 J18.9    early LUL - new  2. Chronic respiratory failure with hypoxia (HCC) on [place] O2 518.83 J96.11    799.02    3. Paranoid schizophrenia, chronic condition (HCC) - stable 295.32 F20.0     Rocephin 500mg  IM q12hr x 2 weeks  floraster po BID x 4 weeks  duoneb  via neb TID  Prednisone  take 4 tabs po daily x 2 days-->3 x 2 days -->2 x 2 days --->1 x 2 days and stop  CXR --> early LUL pneumonia. New compared to May CXR  Cont [place] O[place] ATC as ordered  Cont other meds as ordered  Will follow  Bernetta Sutley S. Ancil Linsey  [facility] [address] [place], [place] [address] [phone] Cell (Monday-Friday 8 AM - 5 PM) [phone] After 5 PM and follow prompts

## 2016-03-22 ENCOUNTER — Non-Acute Institutional Stay (SKILLED_NURSING_FACILITY): Payer: Medicare Other | Admitting: Adult Health

## 2016-03-22 ENCOUNTER — Encounter: Payer: Self-pay | Admitting: Adult Health

## 2016-03-22 DIAGNOSIS — Z9981 Dependence on supplemental oxygen: Secondary | ICD-10-CM

## 2016-03-22 DIAGNOSIS — F200 Paranoid schizophrenia: Secondary | ICD-10-CM | POA: Diagnosis not present

## 2016-03-22 DIAGNOSIS — S22000S Wedge compression fracture of unspecified thoracic vertebra, sequela: Secondary | ICD-10-CM | POA: Diagnosis not present

## 2016-03-22 DIAGNOSIS — J9611 Chronic respiratory failure with hypoxia: Secondary | ICD-10-CM | POA: Diagnosis not present

## 2016-03-22 DIAGNOSIS — R1314 Dysphagia, pharyngoesophageal phase: Secondary | ICD-10-CM | POA: Diagnosis not present

## 2016-03-22 DIAGNOSIS — I10 Essential (primary) hypertension: Secondary | ICD-10-CM

## 2016-03-22 NOTE — Progress Notes (Signed)
Patient ID: Ashley Swanson, female   DOB: [birth year], 82 y.o.   MRN: [id number]    Location:  [facility] Room Number: 110-A Place of Service:  SNF (31)   CODE STATUS: DNR  Allergies  Allergen Reactions  . Ciprofloxacin     unknown  . Effexor [Venlafaxine]     Intolerance  . Enablex [Darifenacin Hydrobromide Er]     unknown  . Macrobid [employer][Nitrofurantoin Monohyd Macro]     unknown  . Metronidazole And Related     unknown  . Nsaids     unknown  . Reclast [Zoledronic Acid]     unknown  . Sulfa Antibiotics     unknown    Chief Complaint  Patient presents with  . Medical Management of Chronic Issues     HPI:  She is a long term resident of this facility being seen for the management of her chronic illnesses. Overall there is little change in her status.She is voicing any complaints today or any co. There are no nursing concerns at this time.  She is currently being treated for pneumonia.   Past Medical History  Diagnosis Date  . Hyperlipidemia   . Osteoporosis   . Stasis dermatitis   . Diverticulosis   . Cataract   . Thyroid disease     hypothyroid  . GERD (gastroesophageal reflux disease)   . Hypertension   . Depression     Past Surgical History  Procedure Laterality Date  . Cholecystectomy    . Abdominal hysterectomy    . Tubal ligation    . Breast surgery      biopsy  . Tonsilectomy/adenoidectomy with myringotomy    . Cataract extraction    . Appendectomy    . Vertebrolplasty      Social History   Social History  . Marital Status: Widowed    Spouse Name: N/A  . Number of Children: N/A  . Years of Education: N/A   Occupational History  . Not on file.   Social History Main Topics  . Smoking status: Former Games developermoker  . Smokeless tobacco: Not on file  . Alcohol Use: Yes  . Drug Use: Not on file  . Sexual Activity: Not on file   Other Topics Concern  . Not on file   Social History Narrative   Family History    Problem Relation Age of Onset  . Aortic aneurysm Mother   . Cancer Father     mouth  . Diabetes Neg Hx   . Coronary artery disease Neg Hx       VITAL SIGNS BP 126/77 mmHg  Pulse 78  Temp(Src) 97.9 F (36.6 C) (Oral)  Resp 16  Ht 5\' 2"  (1.575 m)  Wt 113 lb (51.256 kg)  BMI 20.66 kg/m2  SpO2 96%  Patient's Medications  New Prescriptions   No medications on file  Previous Medications   ACETAMINOPHEN (TYLENOL) 325 MG TABLET    Take 650 mg by mouth every 8 (eight) hours.   ALUMINUM-MAGNESIUM HYDROXIDE-SIMETHICONE (MAALOX) 200-200-20 MG/5ML SUSP    Take 15 mLs by mouth every 4 (four) hours as needed (reflux).   BENZOCAINE (ANBESOL) 10 % MUCOSAL GEL    Use as directed 1 application in the mouth or throat every 8 (eight) hours as needed for mouth pain.    BENZONATATE (TESSALON) 100 MG CAPSULE    Take 100 mg by mouth 3 (three) times daily as needed for cough.   BISACODYL (DULCOLAX) 10 MG  SUPPOSITORY    Place 10 mg rectally as needed for moderate constipation.   BRIMONIDINE (ALPHAGAN) 0.2 % OPHTHALMIC SOLUTION    Place 1 drop into the right eye 3 (three) times daily.   CALCIUM CARBONATE (OS-CAL - DOSED IN MG OF ELEMENTAL CALCIUM) 1250 (500 CA) MG TABLET    Take 1 tablet by mouth daily with breakfast.   CEFTRIAXONE (ROCEPHIN) 500 MG INJECTION    Inject 500 mg into the muscle every 12 (twelve) hours. For IM use in large muscle mass   CITALOPRAM (CELEXA) 20 MG TABLET    Take 20 mg by mouth daily.    DOCUSATE SODIUM (COLACE) 100 MG CAPSULE    Take 200 mg by mouth every morning.   DORZOLAMIDE-TIMOLOL (COSOPT) 22.3-6.8 MG/ML OPHTHALMIC SOLUTION    Place 1 drop into both eyes 2 (two) times daily.   FOOD THICKENER (THICK IT) POWD    Take 1 Container by mouth as needed. HONEY   GUAIFENESIN (MUCINEX) 600 MG 12 HR TABLET    Take 600 mg by mouth every 12 (twelve) hours as needed (congestion).   HYDROCORTISONE (ANUSOL-HC) 2.5 % RECTAL CREAM    Place 1 application rectally 2 (two) times daily as  needed.    HYPROMELLOSE 0.4 % SOLN    Place 1 drop into the right eye 4 (four) times daily.   IPRATROPIUM-ALBUTEROL (DUONEB) 0.5-2.5 (3) MG/3ML SOLN    Take 3 mLs by nebulization every 8 (eight) hours as needed.    LORAZEPAM (ATIVAN) 0.5 MG TABLET    Take 0.5 mg by mouth daily. 0.5 mg by mouth every 12 hours as needed for anxiety   MELATONIN 10 MG CAPS    Take 10 mg by mouth at bedtime.   MULTIPLE VITAMIN (MULTIVITAMIN WITH MINERALS) TABS TABLET    Take 1 tablet by mouth daily.   NUTRITIONAL SUPPLEMENTS (NUTRITIONAL SUPPLEMENT PO)    Take by mouth. Puree texture, Honey consistency Magic cup TID   OXYCODONE (OXY IR/ROXICODONE) 5 MG IMMEDIATE RELEASE TABLET    Take 1 tablet (5 mg total) by mouth every 4 (four) hours as needed for moderate pain or severe pain.   OXYGEN    Inhale 2 L/min into the lungs as needed (low o2 levels).   PREDNISOLONE ACETATE (PRED FORTE) 1 % OPHTHALMIC SUSPENSION    Place 1 drop into the right eye 2 (two) times daily.    QUETIAPINE (SEROQUEL) 25 MG TABLET    Take 50 mg by mouth 2 (two) times daily.    RANITIDINE (ZANTAC) 300 MG TABLET    Take 300 mg by mouth at bedtime.   TRAZODONE (DESYREL) 25 MG TABS TABLET    Take 25 mg by mouth at bedtime.   VITAMIN B-12 (CYANOCOBALAMIN) 1000 MCG TABLET    Take 1,000 mcg by mouth daily.  Modified Medications   No medications on file  Discontinued Medications   ALBUTEROL (PROVENTIL) (2.5 MG/3ML) 0.083% NEBULIZER SOLUTION    Take 2.5 mg by nebulization every 4 (four) hours as needed for wheezing or shortness of breath. Reported on 03/22/2016     SIGNIFICANT DIAGNOSTIC EXAMS  08-06-15: chest x-ray; 1. No acute abnormality. 2. Mild cardiomegaly. 3. Mild linear scarring at the left lung base.   08-06-15: ct of head and cervical spine: 1. No acute intracranial pathology.2. Nondisplaced fracture of the left inferior articulating facet of C6. There is a sclerotic margin on either side of the fracture suggesting a subacute or chronic injury.  There is no other fracture  or subluxation of the cervical spine. If there is further clinical concern, recommend MRI. There is surgical follow-up is recommended.  08-06-15: ct of thoracic spine: . Compression fractures of T8, T10 and T11. T10 and T11 are chronic, with the T8 fracture being of unclear chronicity.  08-31-15: EKG: sinus bradycardia  10-24-15: chest x-ray: minimal plate like atelectasis involving right upper lobe   02-29-16: chest x-ray: no evidence of acute cardiopulmonary disease  02-29-16: EKG: normal sinus rhythm with first degree AV block   03-20-16: chest x-ray:  early active process left upper lobe     LABS REVIEWED:   08-06-15: wbc 8.5; hgb 12.9; hct 41.7; mcv 96.3; plt 192; glucose 107; bun 18; creat 0.81; k+ 3.7; na++143 08-08-15: wbc 7.5; hgb 13.0; hct 42.9; mcv 99.8; plt 194;  08-10-15: wbc 5.4; hgb 12.1; hct 39.3; mcv 97.0; plt 191; glucose 96; bun 16; creat 0.64; k+ 4.3; na++139  08-12-15: glucose 97; bun 15; creat 0.73; k+ 4.5; na++141 08-19-15: glucose 83; bun 15; creat 0.71; k+ 4.6; na++143  10-14-15: wbc 7.3; hgb 14.0; hct 41.8; mcv 90.7 plt 232; glucose 111; bun 15; creat 0.75; k+ 4.1; na++142; liver normal albumin 3.7; tsh 2.917 vit B12; 904; folate 11.2  02-24-16: wbc 6.0; hgb 11.4; hct 34.5; mcv 95.6 plt 192; glucose 92; bun 20; creat 0.81; k+ 4.2; na++ 143; liver normal albumin 3.0; tsh 4.50; hgb a1c 5.7     Review of Systems  Unable to perform ROS: dementia     Physical Exam  Vitals reviewed. Constitutional: No distress. frail  Eyes: Conjunctivae are normal.  Neck: Neck supple. No JVD present. No thyromegaly present.  Cardiovascular: Normal rate, regular rhythm and intact distal pulses.   Respiratory: . No respiratory distress. She has no wheezes. breath sounds diminished throughout.   GI: Soft. Bowel sounds are normal. She exhibits no distension. There is no tenderness.  Musculoskeletal: She exhibits no edema.  Able to move all extremities      Lymphadenopathy:    She has no cervical adenopathy.  Neurological: She is alert.  Skin: Skin is warm and dry. She is not diaphoretic.  Psychiatric: is calm      ASSESSMENT/ PLAN:  1. Schizophrenia  With  Major depression with psychosis: is being followed by psych services; will continue  celexa 20 mg daily ativan 0.5 mg twice daily as needed for anxiety  Will continue seroquel 50 m twice daily is taking  melatonin 10 mg nightly for sleep and  trazodone 25 mg nighty for sleep.  Is followed by psych services.   2. Genella RifeGerd: will continue zantac 300 mg nightly   3. Constipation:  Will continue colace 200 mg daily;   4. Glaucoma: will continue alphagan to right eye three times daily; cosopt to both eyes twice daily; pred forte to right eye four times daily will continue hypromellose to right eye four times daily   5. Compression fracture:  C6;  T8; and T10 and T11 (08/2015) :will continue oxycodone 5 mg every 4 hours as needed;     6. Hypertension: is presently stable is not on medications will monitor   7. Dysphagia: no signs of aspiration present will continue honey thick liquids   8. Weight loss: weight is stable at 113 pounds; will continue supplements per facility protocol and will monitor   9. Chronic hypoxia: is on long term 02 therapy at 2 liters.   Will have her complete her abt for her pneumonia and will monitor her status.  Ok Edwards NP [facility]  Contact [phone] Monday through Friday 8am- 5pm  After hours call [phone]

## 2016-04-17 ENCOUNTER — Non-Acute Institutional Stay (SKILLED_NURSING_FACILITY): Payer: Medicare Other | Admitting: Internal Medicine

## 2016-04-17 ENCOUNTER — Encounter: Payer: Self-pay | Admitting: Internal Medicine

## 2016-04-17 DIAGNOSIS — K219 Gastro-esophageal reflux disease without esophagitis: Secondary | ICD-10-CM

## 2016-04-17 DIAGNOSIS — F333 Major depressive disorder, recurrent, severe with psychotic symptoms: Secondary | ICD-10-CM | POA: Diagnosis not present

## 2016-04-17 DIAGNOSIS — J9611 Chronic respiratory failure with hypoxia: Secondary | ICD-10-CM | POA: Diagnosis not present

## 2016-04-17 DIAGNOSIS — I10 Essential (primary) hypertension: Secondary | ICD-10-CM | POA: Diagnosis not present

## 2016-04-17 DIAGNOSIS — R1314 Dysphagia, pharyngoesophageal phase: Secondary | ICD-10-CM | POA: Diagnosis not present

## 2016-04-17 DIAGNOSIS — F200 Paranoid schizophrenia: Secondary | ICD-10-CM

## 2016-04-17 DIAGNOSIS — K1379 Other lesions of oral mucosa: Secondary | ICD-10-CM | POA: Diagnosis not present

## 2016-04-17 NOTE — Progress Notes (Signed)
Patient ID: Ashley Swanson, female   DOB: 06-Aug-1929, 82 y.o.   MRN: [id number]     DATE: 04/17/16  Location:  Nursing Home Location: [facility]  Nursing Home Room Number: 110 A Place of Service: SNF (31)   Extended Emergency Contact Information Primary Emergency Contact: [facility] Address: [address]          [place], [place] [address] [name] of [place] Home Phone: [phone] Mobile Phone: [phone] Relation: Daughter Secondary Emergency Contact: [name] [place], [place] of [place] Home Phone: [phone] Mobile Phone: [phone] Relation: Son  Advanced Directive information Does patient have an advance directive?: Yes, Type of Advance Directive: Out of facility DNR (pink MOST or yellow form), Does patient want to make changes to advanced directive?: No - Patient declined  Chief Complaint  Patient presents with  . Medical Management of Chronic Issues    Routine Visit    HPI:  82 yo female long term resident seen today for f/u. She reports trouble with her teeth and is having mouth pain. She would like to see dentist. No other concerns. No nursing issues. No falls. Appetite ok. Sleeping well. She is a poor historian due to psych d/o. Hx obtained from chart  Schizophrenia with Major depression with psychosis -  followed by psych services; takes celexa 20 mg daily, ativan 0.5 mg twice daily as needed for anxiety; seroquel 50 m twice daily; melatonin 10 mg nightly for sleep and  trazodone 25 mg nighty for sleep.    GERD - stable on zantac 300 mg nightly   Constipation - stable on colace 200 mg daily;   Glaucoma - stable on alphagan to right eye three times daily; cosopt to both eyes twice daily; pred forte to right eye four times daily;  hypromellose to right eye four times daily   Multiple Compression fractures - at C6;  T8; and T10 and T11 (08/2015)  Pain stable on oxycodone 5 mg every 4 hours as needed;      Hypertension - BP stable. Diet controlled  Dysphagia- no signs of aspiration present will continue honey thick liquids   Weight loss - weight is stable at 113 pounds; gets supplements per facility protocol   Chronic respiratory failure- on long term 02 therapy at 2 liters.     Past Medical History  Diagnosis Date  . Hyperlipidemia   . Osteoporosis   . Stasis dermatitis   . Diverticulosis   . Cataract   . Thyroid disease     hypothyroid  . GERD (gastroesophageal reflux disease)   . Hypertension   . Depression     Past Surgical History  Procedure Laterality Date  . Cholecystectomy    . Abdominal hysterectomy    . Tubal ligation    . Breast surgery      biopsy  . Tonsilectomy/adenoidectomy with myringotomy    . Cataract extraction    . Appendectomy    . Vertebrolplasty      Patient Care Team: [name], MD as PCP - General (Internal Medicine)  Social History   Social History  . Marital Status: Widowed    Spouse Name: N/A  . Number of Children: N/A  . Years of Education: N/A   Occupational History  . Not on file.   Social History Main Topics  . Smoking status: Former Games developer  . Smokeless tobacco: Not on file  . Alcohol Use: Yes  . Drug Use:  Not on file  . Sexual Activity: Not on file   Other Topics Concern  . Not on file   Social History Narrative     reports that she has quit smoking. She does not have any smokeless tobacco history on file. She reports that she drinks alcohol. Her drug history is not on file.  Family History  Problem Relation Age of Onset  . Aortic aneurysm Mother   . Cancer Father     mouth  . Diabetes Neg Hx   . Coronary artery disease Neg Hx    Family Status  Relation Status Death Age  . Mother Deceased   . Father Deceased      There is no immunization history on file for this patient.  Allergies  Allergen Reactions  . Ciprofloxacin     unknown  . Effexor [Venlafaxine]     Intolerance  . Enablex  [Darifenacin Hydrobromide Er]     unknown  . Macrobid [employer][Nitrofurantoin Monohyd Macro]     unknown  . Metronidazole And Related     unknown  . Nsaids     unknown  . Reclast [Zoledronic Acid]     unknown  . Sulfa Antibiotics     unknown    Medications: Patient's Medications  New Prescriptions   No medications on file  Previous Medications   ACETAMINOPHEN (TYLENOL) 325 MG TABLET    Take 650 mg by mouth every 8 (eight) hours.   ALUMINUM-MAGNESIUM HYDROXIDE-SIMETHICONE (MAALOX) 200-200-20 MG/5ML SUSP    Take 15 mLs by mouth every 4 (four) hours as needed (reflux).   BENZOCAINE (ANBESOL) 10 % MUCOSAL GEL    Use as directed 1 application in the mouth or throat every 8 (eight) hours as needed for mouth pain.    BENZONATATE (TESSALON) 100 MG CAPSULE    Take 100 mg by mouth 3 (three) times daily as needed for cough.   BISACODYL (DULCOLAX) 10 MG SUPPOSITORY    Place 10 mg rectally as needed for moderate constipation.   BRIMONIDINE (ALPHAGAN) 0.2 % OPHTHALMIC SOLUTION    Place 1 drop into the right eye 3 (three) times daily.   CALCIUM CARBONATE (OS-CAL - DOSED IN MG OF ELEMENTAL CALCIUM) 1250 (500 CA) MG TABLET    Take 1 tablet by mouth daily with breakfast.   CITALOPRAM (CELEXA) 20 MG TABLET    Take 20 mg by mouth daily.    DOCUSATE SODIUM (COLACE) 100 MG CAPSULE    Take 200 mg by mouth every morning.   DORZOLAMIDE-TIMOLOL (COSOPT) 22.3-6.8 MG/ML OPHTHALMIC SOLUTION    Place 1 drop into both eyes 2 (two) times daily.   FOOD THICKENER (THICK IT) POWD    Take 1 Container by mouth as needed. HONEY   GUAIFENESIN (MUCINEX) 600 MG 12 HR TABLET    Take 600 mg by mouth every 12 (twelve) hours as needed (congestion).   HYDROCORTISONE (ANUSOL-HC) 2.5 % RECTAL CREAM    Place 1 application rectally 2 (two) times daily as needed.    HYPROMELLOSE 0.4 % SOLN    Place 1 drop into the right eye 4 (four) times daily.   IPRATROPIUM-ALBUTEROL (DUONEB) 0.5-2.5 (3) MG/3ML SOLN    Take 3 mLs by nebulization every 8  (eight) hours as needed.    LORAZEPAM (ATIVAN) 0.5 MG TABLET    Take 0.5 mg by mouth daily. 0.5 mg by mouth every 12 hours as needed for anxiety   MAGNESIUM HYDROXIDE (MILK OF MAGNESIA) 400 MG/5ML SUSPENSION    Take 30  mLs by mouth. Every 72 hours as needed   MELATONIN 10 MG CAPS    Take 10 mg by mouth at bedtime.   MULTIPLE VITAMIN (MULTIVITAMIN WITH MINERALS) TABS TABLET    Take 1 tablet by mouth daily.   NON FORMULARY    Magic cup by mouth three times daily   NUTRITIONAL SUPPLEMENTS (NUTRITIONAL SUPPLEMENT PO)    Take by mouth. Puree texture, Honey consistency Magic cup TID   OXYCODONE (OXY IR/ROXICODONE) 5 MG IMMEDIATE RELEASE TABLET    Take 1 tablet (5 mg total) by mouth every 4 (four) hours as needed for moderate pain or severe pain.   OXYGEN    Inhale 2 L/min into the lungs as needed (low o2 levels).   PREDNISOLONE ACETATE (PRED FORTE) 1 % OPHTHALMIC SUSPENSION    Place 1 drop into the right eye 2 (two) times daily.    QUETIAPINE (SEROQUEL) 25 MG TABLET    Take 50 mg by mouth 2 (two) times daily.    RANITIDINE (ZANTAC) 300 MG TABLET    Take 300 mg by mouth at bedtime.   TRAZODONE (DESYREL) 25 MG TABS TABLET    Take 25 mg by mouth at bedtime.   VITAMIN B-12 (CYANOCOBALAMIN) 1000 MCG TABLET    Take 1,000 mcg by mouth daily.  Modified Medications   No medications on file  Discontinued Medications   No medications on file    Review of Systems  Unable to perform ROS: Psychiatric disorder    Filed Vitals:   04/17/16 1411  BP: 110/65  Pulse: 67  Temp: 98.2 F (36.8 C)  TempSrc: Oral  Resp: 18  Height: 5\' 2"  (1.575 m)  SpO2: 96%   There is no weight on file to calculate BMI.  Physical Exam  Constitutional: She appears well-developed.  Frail appearing in NAD, sitting up in bed, [place] O2 intact  HENT:  Mouth/Throat: Oropharynx is clear and moist. No oropharyngeal exudate.  No oral lesions noted. No thrush. No obvious gum disease. No loose teeth appreciated  Eyes: Pupils are  equal, round, and reactive to light. No scleral icterus.  Neck: Neck supple. Carotid bruit is not present. No tracheal deviation present.  Cardiovascular: Normal rate and intact distal pulses.  An irregular rhythm present. Exam reveals no gallop and no friction rub.   Murmur (1/6 SEM) heard. No LE edema b/l. no calf TTP.   Pulmonary/Chest: Effort normal. No stridor. No respiratory distress. She has rales (inspiratory b/l).  Abdominal: Soft. Bowel sounds are normal. She exhibits no distension and no mass. There is no hepatomegaly. There is no tenderness. There is no rebound and no guarding.  Musculoskeletal: She exhibits edema and tenderness (thoracic spinous process; lateral left CW).  Severe thoracic kyphosis   Lymphadenopathy:    She has no cervical adenopathy.  Neurological: She is alert.  Skin: Skin is warm and dry. No rash noted.  Psychiatric: She has a normal mood and affect. Her behavior is normal.     Labs reviewed: Nursing Home on 03/20/2016  Component Date Value Ref Range Status  . Hemoglobin 02/24/2016 11.4* 12.0 - 16.0 g/dL Final  . HCT 16/07/9603 34* 36 - 46 % Final  . Platelets 02/24/2016 192  150 - 399 K/L Final  . WBC 02/24/2016 6.0   Final  . Glucose 02/24/2016 92   Final  . BUN 02/24/2016 20  4 - 21 mg/dL Final  . Creatinine 54/06/8118 0.8  0.5 - 1.1 mg/dL Final  . Potassium 14/78/2956 4.2  3.4 - 5.3 mmol/L Final  . Sodium 02/24/2016 143  137 - 147 mmol/L Final  . Alkaline Phosphatase 02/24/2016 54  25 - 125 U/L Final  . ALT 02/24/2016 7  7 - 35 U/L Final  . AST 02/24/2016 14  13 - 35 U/L Final  . Bilirubin, Total 02/24/2016 0.3   Final  Nursing Home on 02/29/2016  Component Date Value Ref Range Status  . HM Diabetic Foot Exam 02/14/2016 Completed   Final    No results found.   Assessment/Plan   ICD-9-CM ICD-10-CM   1. Mouth pain 528.9 K13.79    related to teeth; no signs of infection at this time  2. Dysphagia, pharyngoesophageal phase 787.24 R13.14    3. Chronic respiratory failure with hypoxia (HCC) 518.83 J96.11    799.02    4. Paranoid schizophrenia, chronic condition (HCC) 295.32 F20.0   5. Essential hypertension 401.9 I10   6. Depression, major, recurrent, severe with psychosis (HCC) 296.34 F33.3   7. Gastroesophageal reflux disease without esophagitis 530.81 K21.9      Add name to facility dentist list  Cont current meds as ordered  PT/OT/ST as indicated  Cont nutritional supplements as ordered  Will follow  Ashley Swanson  [facility] [address] [place], [place] [address] [phone] Cell (Monday-Friday 8 AM - 5 PM) [phone] After 5 PM and follow prompts

## 2016-05-17 ENCOUNTER — Non-Acute Institutional Stay (SKILLED_NURSING_FACILITY): Payer: Medicare Other | Admitting: Adult Health

## 2016-05-17 DIAGNOSIS — F200 Paranoid schizophrenia: Secondary | ICD-10-CM

## 2016-05-17 DIAGNOSIS — F333 Major depressive disorder, recurrent, severe with psychotic symptoms: Secondary | ICD-10-CM | POA: Diagnosis not present

## 2016-05-17 DIAGNOSIS — I10 Essential (primary) hypertension: Secondary | ICD-10-CM | POA: Diagnosis not present

## 2016-05-17 DIAGNOSIS — R1314 Dysphagia, pharyngoesophageal phase: Secondary | ICD-10-CM

## 2016-05-17 DIAGNOSIS — J9611 Chronic respiratory failure with hypoxia: Secondary | ICD-10-CM

## 2016-05-17 DIAGNOSIS — K219 Gastro-esophageal reflux disease without esophagitis: Secondary | ICD-10-CM | POA: Diagnosis not present

## 2016-05-17 DIAGNOSIS — S22000S Wedge compression fracture of unspecified thoracic vertebra, sequela: Secondary | ICD-10-CM

## 2016-06-16 ENCOUNTER — Encounter: Payer: Self-pay | Admitting: Adult Health

## 2016-06-16 NOTE — Progress Notes (Signed)
Location:   Database administrator of Service:  SNF (31)   CODE STATUS: dnr   Allergies  Allergen Reactions  . Ciprofloxacin     unknown  . Effexor [Venlafaxine]     Intolerance  . Enablex [Darifenacin Hydrobromide Er]     unknown  . Macrobid [employer] Macro]     unknown  . Metronidazole And Related     unknown  . Nsaids     unknown  . Reclast [Zoledronic Acid]     unknown  . Sulfa Antibiotics     unknown    Chief Complaint  Patient presents with  . Medical Management of Chronic Issues    HPI:  She is a long term resident of this facility being seen for the management of her chronic illnesses. Overall there is little change in her status. She is unable to fully participate in the hpi or ros. She does not get out of bed on a daily basis. There are no nursing concerns at this time.    Past Medical History:  Diagnosis Date  . Cataract   . Depression   . Diverticulosis   . GERD (gastroesophageal reflux disease)   . Hyperlipidemia   . Hypertension   . Osteoporosis   . Stasis dermatitis   . Thyroid disease    hypothyroid    Past Surgical History:  Procedure Laterality Date  . ABDOMINAL HYSTERECTOMY    . APPENDECTOMY    . BREAST SURGERY     biopsy  . CATARACT EXTRACTION    . CHOLECYSTECTOMY    . TONSILECTOMY/ADENOIDECTOMY WITH MYRINGOTOMY    . TUBAL LIGATION    . vertebrolplasty      Social History   Social History  . Marital status: Widowed    Spouse name: N/A  . Number of children: N/A  . Years of education: N/A   Occupational History  . Not on file.   Social History Main Topics  . Smoking status: Former Games developer  . Smokeless tobacco: Not on file  . Alcohol use Yes  . Drug use: Unknown  . Sexual activity: Not on file   Other Topics Concern  . Not on file   Social History Narrative  . No narrative on file   Family History  Problem Relation Age of Onset  . Aortic aneurysm Mother   . Cancer Father     mouth  . Diabetes  Neg Hx   . Coronary artery disease Neg Hx       VITAL SIGNS BP (!) 150/78   Pulse 77   Ht 5\' 2"  (1.575 m)   Wt 115 lb (52.2 kg)   SpO2 97%   BMI 21.03 kg/m   Patient's Medications  New Prescriptions   No medications on file  Previous Medications   ACETAMINOPHEN (TYLENOL) 325 MG TABLET    Take 650 mg by mouth every 8 (eight) hours.   ALUMINUM-MAGNESIUM HYDROXIDE-SIMETHICONE (MAALOX) 200-200-20 MG/5ML SUSP    Take 15 mLs by mouth every 4 (four) hours as needed (reflux).   BENZOCAINE (ANBESOL) 10 % MUCOSAL GEL    Use as directed 1 application in the mouth or throat every 8 (eight) hours as needed for mouth pain.    BENZONATATE (TESSALON) 100 MG CAPSULE    Take 100 mg by mouth 3 (three) times daily as needed for cough.   BISACODYL (DULCOLAX) 10 MG SUPPOSITORY    Place 10 mg rectally as needed for moderate constipation.   BRIMONIDINE (  ALPHAGAN) 0.2 % OPHTHALMIC SOLUTION    Place 1 drop into the right eye 3 (three) times daily.   CALCIUM CARBONATE (OS-CAL - DOSED IN MG OF ELEMENTAL CALCIUM) 1250 (500 CA) MG TABLET    Take 1 tablet by mouth daily with breakfast.   CITALOPRAM (CELEXA) 20 MG TABLET    Take 20 mg by mouth daily.    DOCUSATE SODIUM (COLACE) 100 MG CAPSULE    Take 200 mg by mouth every morning.   DORZOLAMIDE-TIMOLOL (COSOPT) 22.3-6.8 MG/ML OPHTHALMIC SOLUTION    Place 1 drop into both eyes 2 (two) times daily.   FOOD THICKENER (THICK IT) POWD    Take 1 Container by mouth as needed. HONEY   GUAIFENESIN (MUCINEX) 600 MG 12 HR TABLET    Take 600 mg by mouth every 12 (twelve) hours as needed (congestion).   HYDROCORTISONE (ANUSOL-HC) 2.5 % RECTAL CREAM    Place 1 application rectally 2 (two) times daily as needed.    HYPROMELLOSE 0.4 % SOLN    Place 1 drop into the right eye 4 (four) times daily.   IPRATROPIUM-ALBUTEROL (DUONEB) 0.5-2.5 (3) MG/3ML SOLN    Take 3 mLs by nebulization every 8 (eight) hours as needed.    LORAZEPAM (ATIVAN) 0.5 MG TABLET    Take 0.5 mg by mouth daily.  0.5 mg by mouth every 12 hours as needed for anxiety   MAGNESIUM HYDROXIDE (MILK OF MAGNESIA) 400 MG/5ML SUSPENSION    Take 30 mLs by mouth. Every 72 hours as needed   MELATONIN 10 MG CAPS    Take 10 mg by mouth at bedtime.   MULTIPLE VITAMIN (MULTIVITAMIN WITH MINERALS) TABS TABLET    Take 1 tablet by mouth daily.   NON FORMULARY    Magic cup by mouth three times daily   NUTRITIONAL SUPPLEMENTS (NUTRITIONAL SUPPLEMENT PO)    Take by mouth. Puree texture, Honey consistency Magic cup TID   OXYCODONE (OXY IR/ROXICODONE) 5 MG IMMEDIATE RELEASE TABLET    Take 1 tablet (5 mg total) by mouth every 4 (four) hours as needed for moderate pain or severe pain.   OXYGEN    Inhale 2 L/min into the lungs as needed (low o2 levels).   PREDNISOLONE ACETATE (PRED FORTE) 1 % OPHTHALMIC SUSPENSION    Place 1 drop into the right eye 2 (two) times daily.    QUETIAPINE (SEROQUEL) 25 MG TABLET    Take 50 mg by mouth 2 (two) times daily.    RANITIDINE (ZANTAC) 300 MG TABLET    Take 300 mg by mouth at bedtime.   TRAZODONE (DESYREL) 25 MG TABS TABLET    Take 25 mg by mouth at bedtime.   VITAMIN B-12 (CYANOCOBALAMIN) 1000 MCG TABLET    Take 1,000 mcg by mouth daily.  Modified Medications   No medications on file  Discontinued Medications   No medications on file     SIGNIFICANT DIAGNOSTIC EXAMS   08-06-15: chest x-ray; 1. No acute abnormality. 2. Mild cardiomegaly. 3. Mild linear scarring at the left lung base.   08-06-15: ct of head and cervical spine: 1. No acute intracranial pathology.2. Nondisplaced fracture of the left inferior articulating facet of C6. There is a sclerotic margin on either side of the fracture suggesting a subacute or chronic injury. There is no other fracture or subluxation of the cervical spine. If there is further clinical concern, recommend MRI. There is surgical follow-up is recommended.  08-06-15: ct of thoracic spine: . Compression fractures of T8, T10  and T11. T10 and T11 are chronic,  with the T8 fracture being of unclear chronicity.  08-31-15: EKG: sinus bradycardia  10-24-15: chest x-ray: minimal plate like atelectasis involving right upper lobe   02-29-16: chest x-ray: no evidence of acute cardiopulmonary disease  02-29-16: EKG: normal sinus rhythm with first degree AV block   03-20-16: chest x-ray:  early active process left upper lobe     LABS REVIEWED:   08-06-15: wbc 8.5; hgb 12.9; hct 41.7; mcv 96.3; plt 192; glucose 107; bun 18; creat 0.81; k+ 3.7; na++143 08-08-15: wbc 7.5; hgb 13.0; hct 42.9; mcv 99.8; plt 194;  08-10-15: wbc 5.4; hgb 12.1; hct 39.3; mcv 97.0; plt 191; glucose 96; bun 16; creat 0.64; k+ 4.3; na++139  08-12-15: glucose 97; bun 15; creat 0.73; k+ 4.5; na++141 08-19-15: glucose 83; bun 15; creat 0.71; k+ 4.6; na++143  10-14-15: wbc 7.3; hgb 14.0; hct 41.8; mcv 90.7 plt 232; glucose 111; bun 15; creat 0.75; k+ 4.1; na++142; liver normal albumin 3.7; tsh 2.917 vit B12; 904; folate 11.2  02-24-16: wbc 6.0; hgb 11.4; hct 34.5; mcv 95.6 plt 192; glucose 92; bun 20; creat 0.81; k+ 4.2; na++ 143; liver normal albumin 3.0; tsh 4.50; hgb a1c 5.7     Review of Systems  Unable to perform ROS: dementia     Physical Exam  Vitals reviewed. Constitutional: No distress. frail  Eyes: Conjunctivae are normal.  Neck: Neck supple. No JVD present. No thyromegaly present.  Cardiovascular: Normal rate, regular rhythm and intact distal pulses.   Respiratory: . No respiratory distress. She has no wheezes. breath sounds diminished throughout.   GI: Soft. Bowel sounds are normal. She exhibits no distension. There is no tenderness.  Musculoskeletal: She exhibits no edema.  Able to move all extremities    Lymphadenopathy:    She has no cervical adenopathy.  Neurological: She is alert.  Skin: Skin is warm and dry. She is not diaphoretic.  Psychiatric: is calm      ASSESSMENT/ PLAN:  1. Schizophrenia  With  Major depression with psychosis: is being followed by  psych services; will continue  celexa 20 mg daily ativan 0.5 mg twice daily as needed for anxiety  Will continue seroquel 50 mg  twice daily is taking  melatonin 10 mg nightly for sleep and  trazodone 25 mg nighty for sleep.  Is followed by psych services.   2. Ashley RifeGerd: will continue zantac 300 mg nightly   3. Constipation:  Will continue colace 200 mg daily;   4. Glaucoma: will continue alphagan to right eye three times daily; cosopt to both eyes twice daily; pred forte to right eye four times daily will continue hypromellose to right eye four times daily   5. Compression fracture:  C6;  T8; and T10 and T11 (08/2015) :will continue oxycodone 5 mg every 4 hours as needed;     6. Hypertension: is presently stable is not on medications will monitor   7. Dysphagia: no signs of aspiration present will continue honey thick liquids   8. Weight loss: weight is stable at 115 pounds; will continue supplements per facility protocol and will monitor   9. Chronic respiratory failure with hypoxia: is on long term 02 therapy at 2 liters    will monitor her status.           MD is aware of resident's narcotic use and is in agreement with current plan of care. We will attempt to wean resident as apropriate   Gavin Poundeborah  Telissa Palmisano NP [facility]  Contact [phone] Monday through Friday 8am- 5pm  After hours call [phone]

## 2016-06-25 ENCOUNTER — Non-Acute Institutional Stay (SKILLED_NURSING_FACILITY): Payer: Medicare Other | Admitting: Adult Health

## 2016-06-25 ENCOUNTER — Encounter: Payer: Self-pay | Admitting: Adult Health

## 2016-06-25 DIAGNOSIS — F333 Major depressive disorder, recurrent, severe with psychotic symptoms: Secondary | ICD-10-CM

## 2016-06-25 DIAGNOSIS — R1314 Dysphagia, pharyngoesophageal phase: Secondary | ICD-10-CM | POA: Diagnosis not present

## 2016-06-25 DIAGNOSIS — Z9660 Presence of unspecified orthopedic joint implant: Secondary | ICD-10-CM | POA: Diagnosis not present

## 2016-06-25 DIAGNOSIS — K219 Gastro-esophageal reflux disease without esophagitis: Secondary | ICD-10-CM

## 2016-06-25 DIAGNOSIS — I10 Essential (primary) hypertension: Secondary | ICD-10-CM

## 2016-06-25 DIAGNOSIS — F200 Paranoid schizophrenia: Secondary | ICD-10-CM

## 2016-06-25 DIAGNOSIS — Z9981 Dependence on supplemental oxygen: Secondary | ICD-10-CM | POA: Diagnosis not present

## 2016-06-25 DIAGNOSIS — J9611 Chronic respiratory failure with hypoxia: Secondary | ICD-10-CM

## 2016-06-25 DIAGNOSIS — Z96649 Presence of unspecified artificial hip joint: Secondary | ICD-10-CM

## 2016-06-25 DIAGNOSIS — K5900 Constipation, unspecified: Secondary | ICD-10-CM | POA: Diagnosis not present

## 2016-06-25 DIAGNOSIS — K5909 Other constipation: Secondary | ICD-10-CM

## 2016-06-25 NOTE — Progress Notes (Signed)
Patient ID: Ashley Swanson, female   DOB: [birth year], 82 y.o.   MRN: [id number]   Location:   [facility] Room Number: 110-A Place of Service:  SNF (31)   CODE STATUS: DNR  Allergies  Allergen Reactions  . Ciprofloxacin     unknown  . Effexor [Venlafaxine]     Intolerance  . Enablex [Darifenacin Hydrobromide Er]     unknown  . Macrobid [employer] Macro]     unknown  . Metronidazole And Related     unknown  . Nsaids     unknown  . Reclast [Zoledronic Acid]     unknown  . Sulfa Antibiotics     unknown    Chief Complaint  Patient presents with  . Medical Management of Chronic Issues    Follow up    HPI:  She is a long term resident of this facility being seen for the management of her chronic illnesses. Overall there is little change in her status. She tells me that she is doing ok; but has cancer "everywhere". There are no nursing concerns at this time.   Past Medical History:  Diagnosis Date  . Cataract   . Depression   . Diverticulosis   . GERD (gastroesophageal reflux disease)   . Hyperlipidemia   . Hypertension   . Osteoporosis   . Stasis dermatitis   . Thyroid disease    hypothyroid    Past Surgical History:  Procedure Laterality Date  . ABDOMINAL HYSTERECTOMY    . APPENDECTOMY    . BREAST SURGERY     biopsy  . CATARACT EXTRACTION    . CHOLECYSTECTOMY    . TONSILECTOMY/ADENOIDECTOMY WITH MYRINGOTOMY    . TUBAL LIGATION    . vertebrolplasty      Social History   Social History  . Marital status: Widowed    Spouse name: N/A  . Number of children: N/A  . Years of education: N/A   Occupational History  . Not on file.   Social History Main Topics  . Smoking status: Former Games developer  . Smokeless tobacco: Not on file  . Alcohol use Yes  . Drug use: Unknown  . Sexual activity: Not on file   Other Topics Concern  . Not on file   Social History Narrative  . No narrative on file   Family History  Problem Relation  Age of Onset  . Aortic aneurysm Mother   . Cancer Father     mouth  . Diabetes Neg Hx   . Coronary artery disease Neg Hx       VITAL SIGNS BP 110/76   Pulse 97   Temp (!) 95.7 F (35.4 C) (Oral)   Resp 16   Ht 5\' 2"  (1.575 m)   Wt 119 lb 6 oz (54.1 kg)   SpO2 92%   BMI 21.83 kg/m   Patient's Medications  New Prescriptions   No medications on file  Previous Medications   ACETAMINOPHEN (TYLENOL) 325 MG TABLET    Take 650 mg by mouth every 8 (eight) hours.   ALUMINUM-MAGNESIUM HYDROXIDE-SIMETHICONE (MAALOX) 200-200-20 MG/5ML SUSP    Take 15 mLs by mouth every 4 (four) hours as needed (reflux).   BENZOCAINE (ANBESOL) 10 % MUCOSAL GEL    Use as directed 1 application in the mouth or throat every 8 (eight) hours as needed for mouth pain.    BENZONATATE (TESSALON) 100 MG CAPSULE    Take 100 mg by mouth 3 (three) times daily as  needed for cough.   BISACODYL (DULCOLAX) 10 MG SUPPOSITORY    Place 10 mg rectally as needed for moderate constipation.   BRIMONIDINE (ALPHAGAN) 0.2 % OPHTHALMIC SOLUTION    Place 1 drop into the right eye 3 (three) times daily.   CALCIUM CARBONATE (OS-CAL - DOSED IN MG OF ELEMENTAL CALCIUM) 1250 (500 CA) MG TABLET    Take 1 tablet by mouth daily with breakfast.   CITALOPRAM (CELEXA) 20 MG TABLET    Take 20 mg by mouth daily.    DOCUSATE SODIUM (COLACE) 100 MG CAPSULE    Take 200 mg by mouth every morning.   DORZOLAMIDE-TIMOLOL (COSOPT) 22.3-6.8 MG/ML OPHTHALMIC SOLUTION    Place 1 drop into both eyes 2 (two) times daily.   FOOD THICKENER (THICK IT) POWD    Take 1 Container by mouth as needed. HONEY   GUAIFENESIN (MUCINEX) 600 MG 12 HR TABLET    Take 600 mg by mouth every 12 (twelve) hours as needed (congestion).   HYDROCORTISONE (ANUSOL-HC) 2.5 % RECTAL CREAM    Place 1 application rectally 2 (two) times daily as needed.    HYPROMELLOSE 0.4 % SOLN    Place 1 drop into the right eye 4 (four) times daily.   IPRATROPIUM-ALBUTEROL (DUONEB) 0.5-2.5 (3) MG/3ML SOLN     Take 3 mLs by nebulization every 8 (eight) hours as needed.    LORAZEPAM (ATIVAN) 0.5 MG TABLET    Take 0.5 mg by mouth daily. 0.5 mg by mouth every 12 hours as needed for anxiety   MAGNESIUM HYDROXIDE (MILK OF MAGNESIA) 400 MG/5ML SUSPENSION    Take 30 mLs by mouth. Every 72 hours as needed   MELATONIN 10 MG CAPS    Take 10 mg by mouth at bedtime.   MULTIPLE VITAMIN (MULTIVITAMIN WITH MINERALS) TABS TABLET    Take 1 tablet by mouth daily.   NON FORMULARY    Magic cup by mouth three times daily   NUTRITIONAL SUPPLEMENTS (NUTRITIONAL SUPPLEMENT PO)    Take by mouth. Puree texture, Honey consistency Magic cup TID   OXYGEN    Inhale 2 L/min into the lungs as needed (low o2 levels).   PREDNISOLONE ACETATE (PRED FORTE) 1 % OPHTHALMIC SUSPENSION    Place 1 drop into the right eye 2 (two) times daily.    QUETIAPINE (SEROQUEL) 25 MG TABLET    Take 50 mg by mouth 2 (two) times daily.    RANITIDINE (ZANTAC) 300 MG TABLET    Take 300 mg by mouth at bedtime.   TRAZODONE (DESYREL) 25 MG TABS TABLET    Take 25 mg by mouth at bedtime.   VITAMIN B-12 (CYANOCOBALAMIN) 1000 MCG TABLET    Take 1,000 mcg by mouth daily.  Modified Medications   No medications on file  Discontinued Medications   OXYCODONE (OXY IR/ROXICODONE) 5 MG IMMEDIATE RELEASE TABLET    Take 1 tablet (5 mg total) by mouth every 4 (four) hours as needed for moderate pain or severe pain.     SIGNIFICANT DIAGNOSTIC EXAMS  08-06-15: chest x-ray; 1. No acute abnormality. 2. Mild cardiomegaly. 3. Mild linear scarring at the left lung base.   08-06-15: ct of head and cervical spine: 1. No acute intracranial pathology.2. Nondisplaced fracture of the left inferior articulating facet of C6. There is a sclerotic margin on either side of the fracture suggesting a subacute or chronic injury. There is no other fracture or subluxation of the cervical spine. If there is further clinical concern, recommend MRI.  There is surgical follow-up is  recommended.  08-06-15: ct of thoracic spine: . Compression fractures of T8, T10 and T11. T10 and T11 are chronic, with the T8 fracture being of unclear chronicity.  08-31-15: EKG: sinus bradycardia  10-24-15: chest x-ray: minimal plate like atelectasis involving right upper lobe   02-29-16: chest x-ray: no evidence of acute cardiopulmonary disease  02-29-16: EKG: normal sinus rhythm with first degree AV block   03-20-16: chest x-ray:  early active process left upper lobe     LABS REVIEWED:   08-06-15: wbc 8.5; hgb 12.9; hct 41.7; mcv 96.3; plt 192; glucose 107; bun 18; creat 0.81; k+ 3.7; na++143 08-08-15: wbc 7.5; hgb 13.0; hct 42.9; mcv 99.8; plt 194;  08-10-15: wbc 5.4; hgb 12.1; hct 39.3; mcv 97.0; plt 191; glucose 96; bun 16; creat 0.64; k+ 4.3; na++139  08-12-15: glucose 97; bun 15; creat 0.73; k+ 4.5; na++141 08-19-15: glucose 83; bun 15; creat 0.71; k+ 4.6; na++143  10-14-15: wbc 7.3; hgb 14.0; hct 41.8; mcv 90.7 plt 232; glucose 111; bun 15; creat 0.75; k+ 4.1; na++142; liver normal albumin 3.7; tsh 2.917 vit B12; 904; folate 11.2  02-24-16: wbc 6.0; hgb 11.4; hct 34.5; mcv 95.6 plt 192; glucose 92; bun 20; creat 0.81; k+ 4.2; na++ 143; liver normal albumin 3.0; tsh 4.50; hgb a1c 5.7     Review of Systems  Unable to perform ROS: dementia     Physical Exam  Vitals reviewed. Constitutional: No distress. frail  Eyes: Conjunctivae are normal.  Neck: Neck supple. No JVD present. No thyromegaly present.  Cardiovascular: Normal rate, regular rhythm and intact distal pulses.   Respiratory: . No respiratory distress. She has no wheezes. breath sounds diminished throughout.   GI: Soft. Bowel sounds are normal. She exhibits no distension. There is no tenderness.  Musculoskeletal: She exhibits no edema.  Able to move all extremities    Lymphadenopathy:    She has no cervical adenopathy.  Neurological: She is alert.  Skin: Skin is warm and dry. She is not diaphoretic.  Psychiatric:  is calm      ASSESSMENT/ PLAN:  1. Schizophrenia  With  Major depression with psychosis: is being followed by psych services; will continue  celexa 20 mg daily ativan 0.5 mg twice daily as needed for anxiety  Will continue seroquel 50 mg  twice daily is taking  trazodone 25 mg nighty for sleep.  Is followed by psych services.   She is having delusional thoughts about having cancer   2. Gerd: will continue zantac 300 mg nightly   3. Constipation:  Will continue colace 200 mg daily;   4. Glaucoma: will continue alphagan to right eye three times daily; cosopt to both eyes twice daily; pred forte to right eye four times daily will continue hypromellose to right eye four times daily   5. Hypertension: is presently stable is not on medications will monitor   6. Dysphagia: no signs of aspiration present will continue honey thick liquids   7. Weight loss: weight is stable at 119 pounds; will continue supplements per facility protocol and will monitor   8. Chronic respiratory failure with hypoxia: is on long term 02 therapy at 2 liters  Will continue duoneb every 8 hours as needed   will monitor her status.   9. Dysphagia: no signs of aspiration present; will continue honey thick liquids.    Will check cbc; cmp; hgb a1c tsh   MD is aware of resident's narcotic use and is  in agreement with current plan of care. We will attempt to wean resident as apropriate   Ok Edwards NP [facility]  Contact [phone] Monday through Friday 8am- 5pm  After hours call [phone]

## 2016-06-28 LAB — BASIC METABOLIC PANEL
BUN: 19 mg/dL (ref 4–21)
Creatinine: 0.7 mg/dL (ref 0.5–1.1)
Glucose: 88 mg/dL
POTASSIUM: 4.2 mmol/L (ref 3.4–5.3)
SODIUM: 142 mmol/L (ref 137–147)

## 2016-06-28 LAB — TSH: TSH: 5.53 u[IU]/mL (ref 0.41–5.90)

## 2016-06-28 LAB — HEPATIC FUNCTION PANEL
ALT: 8 U/L (ref 7–35)
AST: 14 U/L (ref 13–35)
Alkaline Phosphatase: 76 U/L (ref 25–125)
BILIRUBIN, TOTAL: 0.5 mg/dL

## 2016-06-28 LAB — CBC AND DIFFERENTIAL
HEMATOCRIT: 37 % (ref 36–46)
HEMOGLOBIN: 11.9 g/dL — AB (ref 12.0–16.0)
Platelets: 221 10*3/uL (ref 150–399)
WBC: 7 10*3/mL

## 2016-06-28 LAB — HEMOGLOBIN A1C: Hemoglobin A1C: 5

## 2016-07-02 ENCOUNTER — Non-Acute Institutional Stay (SKILLED_NURSING_FACILITY): Payer: Medicare Other | Admitting: Adult Health

## 2016-07-02 ENCOUNTER — Encounter: Payer: Self-pay | Admitting: Adult Health

## 2016-07-02 DIAGNOSIS — E034 Atrophy of thyroid (acquired): Secondary | ICD-10-CM | POA: Diagnosis not present

## 2016-07-02 LAB — OTHER LAB RESULT
Albumin: 3
CALCIUM: 8.5 mg/dL
Total Protein: 5.9 g/dL

## 2016-07-02 NOTE — Progress Notes (Signed)
Patient ID: Ashley Swanson, female   DOB: 1929/07/19, 82 y.o.   MRN: [id number]    Location:   [facility] Room Number: 110-A Place of Service:  SNF (31)   CODE STATUS: DNR  Allergies  Allergen Reactions  . Ciprofloxacin     unknown  . Effexor [Venlafaxine]     Intolerance  . Enablex [Darifenacin Hydrobromide Er]     unknown  . Macrobid [employer] Macro]     unknown  . Metronidazole And Related     unknown  . Nsaids     unknown  . Reclast [Zoledronic Acid]     unknown  . Sulfa Antibiotics     unknown    Chief Complaint  Patient presents with  . Acute Visit    thyroid labs      HPI:  Her tsh is elevated at 5.53 . She is not on synthroid at this time. She cannot fully participate in the hpi or ros. She continues to have delusional thought patterns. There are no nursing concerns at this time.   Past Medical History:  Diagnosis Date  . Cataract   . Depression   . Diverticulosis   . GERD (gastroesophageal reflux disease)   . Hyperlipidemia   . Hypertension   . Osteoporosis   . Stasis dermatitis   . Thyroid disease    hypothyroid    Past Surgical History:  Procedure Laterality Date  . ABDOMINAL HYSTERECTOMY    . APPENDECTOMY    . BREAST SURGERY     biopsy  . CATARACT EXTRACTION    . CHOLECYSTECTOMY    . TONSILECTOMY/ADENOIDECTOMY WITH MYRINGOTOMY    . TUBAL LIGATION    . vertebrolplasty      Social History   Social History  . Marital status: Widowed    Spouse name: N/A  . Number of children: N/A  . Years of education: N/A   Occupational History  . Not on file.   Social History Main Topics  . Smoking status: Former Games developer  . Smokeless tobacco: Not on file  . Alcohol use Yes  . Drug use: Unknown  . Sexual activity: Not on file   Other Topics Concern  . Not on file   Social History Narrative  . No narrative on file   Family History  Problem Relation Age of Onset  . Aortic aneurysm Mother   . Cancer Father     mouth  . Diabetes Neg Hx   . Coronary artery disease Neg Hx       VITAL SIGNS BP (!) 160/68   Pulse 78   Temp 98.3 F (36.8 C) (Oral)   Resp 18   Ht 5\' 2"  (1.575 m)   Wt 114 lb 4 oz (51.8 kg)   SpO2 97%   BMI 20.90 kg/m   Patient's Medications  New Prescriptions   No medications on file  Previous Medications   ACETAMINOPHEN (TYLENOL) 325 MG TABLET    Take 650 mg by mouth every 8 (eight) hours.   ALUMINUM-MAGNESIUM HYDROXIDE-SIMETHICONE (MAALOX) 200-200-20 MG/5ML SUSP    Take 15 mLs by mouth every 4 (four) hours as needed (reflux).   BENZOCAINE (ANBESOL) 10 % MUCOSAL GEL    Use as directed 1 application in the mouth or throat every 8 (eight) hours as needed for mouth pain.    BENZONATATE (TESSALON) 100 MG CAPSULE    Take 100 mg by mouth 3 (three) times daily as needed for cough.   BISACODYL (DULCOLAX) 10  MG SUPPOSITORY    Place 10 mg rectally as needed for moderate constipation.   BRIMONIDINE (ALPHAGAN) 0.2 % OPHTHALMIC SOLUTION    Place 1 drop into the right eye 3 (three) times daily.   CALCIUM CARBONATE (OS-CAL - DOSED IN MG OF ELEMENTAL CALCIUM) 1250 (500 CA) MG TABLET    Take 1 tablet by mouth daily with breakfast.   CITALOPRAM (CELEXA) 20 MG TABLET    Take 20 mg by mouth daily.    DOCUSATE SODIUM (COLACE) 100 MG CAPSULE    Take 200 mg by mouth every morning.   DORZOLAMIDE-TIMOLOL (COSOPT) 22.3-6.8 MG/ML OPHTHALMIC SOLUTION    Place 1 drop into both eyes 2 (two) times daily.   FOOD THICKENER (THICK IT) POWD    Take 1 Container by mouth as needed. HONEY   GUAIFENESIN (MUCINEX) 600 MG 12 HR TABLET    Take 600 mg by mouth every 12 (twelve) hours as needed (congestion).   HYDROCORTISONE (ANUSOL-HC) 2.5 % RECTAL CREAM    Place 1 application rectally 2 (two) times daily as needed.    HYPROMELLOSE 0.4 % SOLN    Place 1 drop into the right eye 4 (four) times daily.   IPRATROPIUM-ALBUTEROL (DUONEB) 0.5-2.5 (3) MG/3ML SOLN    Take 3 mLs by nebulization every 8 (eight) hours as needed.     LORAZEPAM (ATIVAN) 0.5 MG TABLET    Take 0.5 mg by mouth daily.    MAGNESIUM HYDROXIDE (MILK OF MAGNESIA) 400 MG/5ML SUSPENSION    Take 30 mLs by mouth. Every 72 hours as needed   MELATONIN 10 MG CAPS    Take 10 mg by mouth at bedtime.   MULTIPLE VITAMIN (MULTIVITAMIN WITH MINERALS) TABS TABLET    Take 1 tablet by mouth daily.   NON FORMULARY    Magic cup by mouth three times daily   NUTRITIONAL SUPPLEMENTS (NUTRITIONAL SUPPLEMENT PO)    Take by mouth. Puree texture, Honey consistency Magic cup TID   OXYGEN    Inhale 2 L/min into the lungs as needed (low o2 levels).   PREDNISOLONE ACETATE (PRED FORTE) 1 % OPHTHALMIC SUSPENSION    Place 1 drop into the right eye 2 (two) times daily.    QUETIAPINE (SEROQUEL) 25 MG TABLET    Take 50 mg by mouth. Give 50 mg in the morning and give 75 mg at bedtime   RANITIDINE (ZANTAC) 300 MG TABLET    Take 300 mg by mouth at bedtime.   TRAZODONE (DESYREL) 25 MG TABS TABLET    Take 25 mg by mouth at bedtime.   VITAMIN B-12 (CYANOCOBALAMIN) 1000 MCG TABLET    Take 1,000 mcg by mouth daily.  Modified Medications   No medications on file  Discontinued Medications   No medications on file     SIGNIFICANT DIAGNOSTIC EXAMS  08-06-15: chest x-ray; 1. No acute abnormality. 2. Mild cardiomegaly. 3. Mild linear scarring at the left lung base.   08-06-15: ct of head and cervical spine: 1. No acute intracranial pathology.2. Nondisplaced fracture of the left inferior articulating facet of C6. There is a sclerotic margin on either side of the fracture suggesting a subacute or chronic injury. There is no other fracture or subluxation of the cervical spine. If there is further clinical concern, recommend MRI. There is surgical follow-up is recommended.  08-06-15: ct of thoracic spine: . Compression fractures of T8, T10 and T11. T10 and T11 are chronic, with the T8 fracture being of unclear chronicity.  08-31-15: EKG: sinus bradycardia  10-24-15: chest x-ray: minimal plate  like atelectasis involving right upper lobe   02-29-16: chest x-ray: no evidence of acute cardiopulmonary disease  02-29-16: EKG: normal sinus rhythm with first degree AV block   03-20-16: chest x-ray:  early active process left upper lobe     LABS REVIEWED:   08-06-15: wbc 8.5; hgb 12.9; hct 41.7; mcv 96.3; plt 192; glucose 107; bun 18; creat 0.81; k+ 3.7; na++143 08-08-15: wbc 7.5; hgb 13.0; hct 42.9; mcv 99.8; plt 194;  08-10-15: wbc 5.4; hgb 12.1; hct 39.3; mcv 97.0; plt 191; glucose 96; bun 16; creat 0.64; k+ 4.3; na++139  08-12-15: glucose 97; bun 15; creat 0.73; k+ 4.5; na++141 08-19-15: glucose 83; bun 15; creat 0.71; k+ 4.6; na++143  10-14-15: wbc 7.3; hgb 14.0; hct 41.8; mcv 90.7 plt 232; glucose 111; bun 15; creat 0.75; k+ 4.1; na++142; liver normal albumin 3.7; tsh 2.917 vit B12; 904; folate 11.2  02-24-16: wbc 6.0; hgb 11.4; hct 34.5; mcv 95.6 plt 192; glucose 92; bun 20; creat 0.81; k+ 4.2; na++ 143; liver normal albumin 3.0; tsh 4.50; hgb a1c 5.7   06-27-16: wbc 7.0; hgb 11.9; hct 36.5; mcv 92.4 plt 221; glucose 88'; bun 19' creat 0.70; k+ 4.2; na++ 142 liver normal albumin 3.5; tsh 5.53; hgb a1c 5.0     Review of Systems  Unable to perform ROS: dementia     Physical Exam  Vitals reviewed. Constitutional: No distress. frail  Eyes: Conjunctivae are normal.  Neck: Neck supple. No JVD present. No thyromegaly present.  Cardiovascular: Normal rate, regular rhythm and intact distal pulses.   Respiratory: . No respiratory distress. She has no wheezes. breath sounds diminished throughout.   GI: Soft. Bowel sounds are normal. She exhibits no distension. There is no tenderness.  Musculoskeletal: She exhibits no edema.  Able to move all extremities    Lymphadenopathy:    She has no cervical adenopathy.  Neurological: She is alert.  Skin: Skin is warm and dry. She is not diaphoretic.  Psychiatric: is calm      ASSESSMENT/ PLAN:  1. Hypothyroidism: will begin synthroid 12.5  mcg daily will check thyroid labs in one month    MD is aware of resident's narcotic use and is in agreement with current plan of care. We will attempt to wean resident as apropriate   Synthia Innocent NP [facility]  Contact [phone] Monday through Friday 8am- 5pm  After hours call [phone]

## 2016-07-12 ENCOUNTER — Non-Acute Institutional Stay (SKILLED_NURSING_FACILITY): Payer: Medicare Other | Admitting: Adult Health

## 2016-07-12 DIAGNOSIS — L03319 Cellulitis of trunk, unspecified: Secondary | ICD-10-CM

## 2016-07-12 DIAGNOSIS — L02219 Cutaneous abscess of trunk, unspecified: Secondary | ICD-10-CM

## 2016-07-12 NOTE — Progress Notes (Signed)
Patient ID: Ashley Swanson, female   DOB: 23-Nov-1928, 82 y.o.   MRN: [id number]   Location:   [facility] Room Number: 110 Place of Service:  SNF (31)   CODE STATUS: DNR  Allergies  Allergen Reactions  . Ciprofloxacin     unknown  . Effexor [Venlafaxine]     Intolerance  . Enablex [Darifenacin Hydrobromide Er]     unknown  . Macrobid [employer] Macro]     unknown  . Metronidazole And Related     unknown  . Nsaids     unknown  . Reclast [Zoledronic Acid]     unknown  . Sulfa Antibiotics     unknown    Chief Complaint  Patient presents with  . Acute Visit    Rash    HPI:  She has an inflamed rash on her upper extremities and chest. She has been treated for scabies and are now inflamed. The nursing staff is concerned about cellulitis. There are no reports of fever present. She tells me that the rash itches.    Past Medical History:  Diagnosis Date  . Cataract   . Depression   . Diverticulosis   . GERD (gastroesophageal reflux disease)   . Hyperlipidemia   . Hypertension   . Osteoporosis   . Stasis dermatitis   . Thyroid disease    hypothyroid    Past Surgical History:  Procedure Laterality Date  . ABDOMINAL HYSTERECTOMY    . APPENDECTOMY    . BREAST SURGERY     biopsy  . CATARACT EXTRACTION    . CHOLECYSTECTOMY    . TONSILECTOMY/ADENOIDECTOMY WITH MYRINGOTOMY    . TUBAL LIGATION    . vertebrolplasty      Social History   Social History  . Marital status: Widowed    Spouse name: N/A  . Number of children: N/A  . Years of education: N/A   Occupational History  . Not on file.   Social History Main Topics  . Smoking status: Former Games developer  . Smokeless tobacco: Not on file  . Alcohol use Yes  . Drug use: Unknown  . Sexual activity: Not on file   Other Topics Concern  . Not on file   Social History Narrative  . No narrative on file   Family History  Problem Relation Age of Onset  . Aortic aneurysm Mother   .  Cancer Father     mouth  . Diabetes Neg Hx   . Coronary artery disease Neg Hx       VITAL SIGNS There were no vitals taken for this visit.  Patient's Medications  New Prescriptions   No medications on file  Previous Medications   ACETAMINOPHEN (TYLENOL) 325 MG TABLET    Take 650 mg by mouth every 8 (eight) hours.   ALUMINUM-MAGNESIUM HYDROXIDE-SIMETHICONE (MAALOX) 200-200-20 MG/5ML SUSP    Take 15 mLs by mouth every 4 (four) hours as needed (reflux).   BENZOCAINE (ANBESOL) 10 % MUCOSAL GEL    Use as directed 1 application in the mouth or throat every 8 (eight) hours as needed for mouth pain.    BENZONATATE (TESSALON) 100 MG CAPSULE    Take 100 mg by mouth 3 (three) times daily as needed for cough.   BISACODYL (DULCOLAX) 10 MG SUPPOSITORY    Place 10 mg rectally as needed for moderate constipation.   BRIMONIDINE (ALPHAGAN) 0.2 % OPHTHALMIC SOLUTION    Place 1 drop into the right eye 3 (three) times daily.  CALCIUM CARBONATE (OS-CAL - DOSED IN MG OF ELEMENTAL CALCIUM) 1250 (500 CA) MG TABLET    Take 1 tablet by mouth daily with breakfast.   CITALOPRAM (CELEXA) 20 MG TABLET    Take 20 mg by mouth daily.    DOCUSATE SODIUM (COLACE) 100 MG CAPSULE    Take 200 mg by mouth every morning.   DORZOLAMIDE-TIMOLOL (COSOPT) 22.3-6.8 MG/ML OPHTHALMIC SOLUTION    Place 1 drop into both eyes 2 (two) times daily.   FOOD THICKENER (THICK IT) POWD    Take 1 Container by mouth as needed. HONEY   GUAIFENESIN (MUCINEX) 600 MG 12 HR TABLET    Take 600 mg by mouth every 12 (twelve) hours as needed (congestion).   HYDROCORTISONE (ANUSOL-HC) 2.5 % RECTAL CREAM    Place 1 application rectally 2 (two) times daily as needed.    HYPROMELLOSE 0.4 % SOLN    Place 1 drop into the right eye 4 (four) times daily.   IPRATROPIUM-ALBUTEROL (DUONEB) 0.5-2.5 (3) MG/3ML SOLN    Take 3 mLs by nebulization every 8 (eight) hours as needed.    LEVOTHYROXINE (SYNTHROID, LEVOTHROID) 125 MCG TABLET    Take 125 mcg by mouth daily  before breakfast.   LORAZEPAM (ATIVAN) 0.5 MG TABLET    Take 0.5 mg by mouth daily.    MAGNESIUM HYDROXIDE (MILK OF MAGNESIA) 400 MG/5ML SUSPENSION    Take 30 mLs by mouth. Every 72 hours as needed   MELATONIN PO    Take 6 mg by mouth at bedtime.   MULTIPLE VITAMIN (MULTIVITAMIN WITH MINERALS) TABS TABLET    Take 1 tablet by mouth daily.   NON FORMULARY    Magic cup by mouth three times daily   NUTRITIONAL SUPPLEMENTS (NUTRITIONAL SUPPLEMENT PO)    Take by mouth. Puree texture, Honey consistency Magic cup TID   OXYGEN    Inhale 2 L/min into the lungs as needed (low o2 levels).   PREDNISOLONE ACETATE (PRED FORTE) 1 % OPHTHALMIC SUSPENSION    Place 1 drop into the right eye 2 (two) times daily.    QUETIAPINE (SEROQUEL) 100 MG TABLET    Take 100 mg by mouth at bedtime.   QUETIAPINE (SEROQUEL) 25 MG TABLET    Take 50 mg by mouth. Give 50 mg in the morning   RANITIDINE (ZANTAC) 300 MG TABLET    Take 300 mg by mouth at bedtime.   VITAMIN B-12 (CYANOCOBALAMIN) 1000 MCG TABLET    Take 1,000 mcg by mouth daily.  Modified Medications   No medications on file  Discontinued Medications   MELATONIN 10 MG CAPS    Take 10 mg by mouth at bedtime.   TRAZODONE (DESYREL) 25 MG TABS TABLET    Take 25 mg by mouth at bedtime.     SIGNIFICANT DIAGNOSTIC EXAMS  08-06-15: chest x-ray; 1. No acute abnormality. 2. Mild cardiomegaly. 3. Mild linear scarring at the left lung base.   08-06-15: ct of head and cervical spine: 1. No acute intracranial pathology.2. Nondisplaced fracture of the left inferior articulating facet of C6. There is a sclerotic margin on either side of the fracture suggesting a subacute or chronic injury. There is no other fracture or subluxation of the cervical spine. If there is further clinical concern, recommend MRI. There is surgical follow-up is recommended.  08-06-15: ct of thoracic spine: . Compression fractures of T8, T10 and T11. T10 and T11 are chronic, with the T8 fracture being of  unclear chronicity.  08-31-15: EKG: sinus  bradycardia  10-24-15: chest x-ray: minimal plate like atelectasis involving right upper lobe   02-29-16: chest x-ray: no evidence of acute cardiopulmonary disease  02-29-16: EKG: normal sinus rhythm with first degree AV block   03-20-16: chest x-ray:  early active process left upper lobe     LABS REVIEWED:   08-06-15: wbc 8.5; hgb 12.9; hct 41.7; mcv 96.3; plt 192; glucose 107; bun 18; creat 0.81; k+ 3.7; na++143 08-08-15: wbc 7.5; hgb 13.0; hct 42.9; mcv 99.8; plt 194;  08-10-15: wbc 5.4; hgb 12.1; hct 39.3; mcv 97.0; plt 191; glucose 96; bun 16; creat 0.64; k+ 4.3; na++139  08-12-15: glucose 97; bun 15; creat 0.73; k+ 4.5; na++141 08-19-15: glucose 83; bun 15; creat 0.71; k+ 4.6; na++143  10-14-15: wbc 7.3; hgb 14.0; hct 41.8; mcv 90.7 plt 232; glucose 111; bun 15; creat 0.75; k+ 4.1; na++142; liver normal albumin 3.7; tsh 2.917 vit B12; 904; folate 11.2  02-24-16: wbc 6.0; hgb 11.4; hct 34.5; mcv 95.6 plt 192; glucose 92; bun 20; creat 0.81; k+ 4.2; na++ 143; liver normal albumin 3.0; tsh 4.50; hgb a1c 5.7   06-27-16: wbc 7.0; hgb 11.9; hct 36.5; mcv 92.4 plt 221; glucose 88'; bun 19' creat 0.70; k+ 4.2; na++ 142 liver normal albumin 3.5; tsh 5.53; hgb a1c 5.0     Review of Systems  Unable to perform ROS: dementia     Physical Exam  Vitals reviewed. Constitutional: No distress. frail  Eyes: Conjunctivae are normal.  Neck: Neck supple. No JVD present. No thyromegaly present.  Cardiovascular: Normal rate, regular rhythm and intact distal pulses.   Respiratory: . No respiratory distress. She has no wheezes. breath sounds diminished throughout.   GI: Soft. Bowel sounds are normal. She exhibits no distension. There is no tenderness.  Musculoskeletal: She exhibits no edema.  Able to move all extremities    Lymphadenopathy:    She has no cervical adenopathy.  Neurological: She is alert.  Skin: Skin is warm and dry. She is not diaphoretic.    Has inflamed rash on bilateral arms and chest with scratch marks present they are red and hot; no open lesions present  Psychiatric: is calm      ASSESSMENT/ PLAN:  1. Cellulitis: will begin augmentin 875 mg twice daily for 10 days with florastor and will monitor    MD is aware of resident's narcotic use and is in agreement with current plan of care. We will attempt to wean resident as appropriate.     Synthia Innocenteborah Shaquayla Klimas NP [facility]  Contact [phone] Monday through Friday 8am- 5pm  After hours call [phone]

## 2016-07-26 ENCOUNTER — Non-Acute Institutional Stay (SKILLED_NURSING_FACILITY): Payer: Medicare Other | Admitting: Adult Health

## 2016-07-26 ENCOUNTER — Encounter: Payer: Self-pay | Admitting: Adult Health

## 2016-07-26 DIAGNOSIS — E034 Atrophy of thyroid (acquired): Secondary | ICD-10-CM | POA: Diagnosis not present

## 2016-07-26 DIAGNOSIS — F333 Major depressive disorder, recurrent, severe with psychotic symptoms: Secondary | ICD-10-CM

## 2016-07-26 DIAGNOSIS — H409 Unspecified glaucoma: Secondary | ICD-10-CM

## 2016-07-26 DIAGNOSIS — J9611 Chronic respiratory failure with hypoxia: Secondary | ICD-10-CM

## 2016-07-26 DIAGNOSIS — R634 Abnormal weight loss: Secondary | ICD-10-CM | POA: Diagnosis not present

## 2016-07-26 DIAGNOSIS — F200 Paranoid schizophrenia: Secondary | ICD-10-CM

## 2016-07-26 DIAGNOSIS — K219 Gastro-esophageal reflux disease without esophagitis: Secondary | ICD-10-CM | POA: Diagnosis not present

## 2016-07-26 DIAGNOSIS — R1314 Dysphagia, pharyngoesophageal phase: Secondary | ICD-10-CM

## 2016-07-26 NOTE — Progress Notes (Signed)
Patient ID: Ashley Swanson, female   DOB: 12/10/1928, 82 y.o.   MRN: [id number]    Location:   [facility] Room Number: 110-A Place of Service:  SNF (31)   CODE STATUS: DNR  Allergies  Allergen Reactions  . Ciprofloxacin     unknown  . Effexor [Venlafaxine]     Intolerance  . Enablex [Darifenacin Hydrobromide Er]     unknown  . Macrobid [employer] Macro]     unknown  . Metronidazole And Related     unknown  . Nsaids     unknown  . Reclast [Zoledronic Acid]     unknown  . Sulfa Antibiotics     unknown    Chief Complaint  Patient presents with  . Medical Management of Chronic Issues    Follow up    HPI:  She is a long term resident of this facility being seen for the management of her chronic illnesses. Overall there is little change in her status. She will spend extended periods of time in her bed per her choice. There are no nursing concerns at this time.    Past Medical History:  Diagnosis Date  . Cataract   . Depression   . Diverticulosis   . GERD (gastroesophageal reflux disease)   . Hyperlipidemia   . Hypertension   . Osteoporosis   . Stasis dermatitis   . Thyroid disease    hypothyroid    Past Surgical History:  Procedure Laterality Date  . ABDOMINAL HYSTERECTOMY    . APPENDECTOMY    . BREAST SURGERY     biopsy  . CATARACT EXTRACTION    . CHOLECYSTECTOMY    . TONSILECTOMY/ADENOIDECTOMY WITH MYRINGOTOMY    . TUBAL LIGATION    . vertebrolplasty      Social History   Social History  . Marital status: Widowed    Spouse name: N/A  . Number of children: N/A  . Years of education: N/A   Occupational History  . Not on file.   Social History Main Topics  . Smoking status: Former Games developer  . Smokeless tobacco: Not on file  . Alcohol use Yes  . Drug use: Unknown  . Sexual activity: Not on file   Other Topics Concern  . Not on file   Social History Narrative  . No narrative on file   Family History  Problem  Relation Age of Onset  . Aortic aneurysm Mother   . Cancer Father     mouth  . Diabetes Neg Hx   . Coronary artery disease Neg Hx       VITAL SIGNS BP (!) 152/79   Pulse 94   Temp 97.9 F (36.6 C) (Oral)   Resp 18   Ht 5\' 2"  (1.575 m)   Wt 114 lb (51.7 kg)   SpO2 93%   BMI 20.85 kg/m   Patient's Medications  New Prescriptions   No medications on file  Previous Medications   ACETAMINOPHEN (TYLENOL) 325 MG TABLET    Take 650 mg by mouth every 8 (eight) hours.   ALUMINUM-MAGNESIUM HYDROXIDE-SIMETHICONE (MAALOX) 200-200-20 MG/5ML SUSP    Take 15 mLs by mouth every 4 (four) hours as needed (reflux).   BENZOCAINE (ANBESOL) 10 % MUCOSAL GEL    Use as directed 1 application in the mouth or throat every 8 (eight) hours as needed for mouth pain.    BENZONATATE (TESSALON) 100 MG CAPSULE    Take 100 mg by mouth 3 (three) times daily  as needed for cough.   BISACODYL (DULCOLAX) 10 MG SUPPOSITORY    Place 10 mg rectally as needed for moderate constipation.   BRIMONIDINE (ALPHAGAN) 0.2 % OPHTHALMIC SOLUTION    Place 1 drop into the right eye 3 (three) times daily.   CALCIUM CARBONATE (OS-CAL - DOSED IN MG OF ELEMENTAL CALCIUM) 1250 (500 CA) MG TABLET    Take 1 tablet by mouth daily with breakfast.   CITALOPRAM (CELEXA) 20 MG TABLET    Take 20 mg by mouth daily.    DOCUSATE SODIUM (COLACE) 100 MG CAPSULE    Take 200 mg by mouth every morning.   DORZOLAMIDE-TIMOLOL (COSOPT) 22.3-6.8 MG/ML OPHTHALMIC SOLUTION    Place 1 drop into both eyes 2 (two) times daily.   FOOD THICKENER (THICK IT) POWD    Take 1 Container by mouth as needed. HONEY   GUAIFENESIN (MUCINEX) 600 MG 12 HR TABLET    Take 600 mg by mouth every 12 (twelve) hours as needed (congestion).   HYDROCORTISONE (ANUSOL-HC) 2.5 % RECTAL CREAM    Place 1 application rectally 2 (two) times daily as needed.    HYDROXYZINE (ATARAX/VISTARIL) 25 MG TABLET    Take 25 mg by mouth 2 (two) times daily as needed.   IPRATROPIUM-ALBUTEROL (DUONEB)  0.5-2.5 (3) MG/3ML SOLN    Take 3 mLs by nebulization every 8 (eight) hours as needed.    LEVOTHYROXINE (SYNTHROID, LEVOTHROID) 25 MCG TABLET    Take 12.5 mcg by mouth daily before breakfast.   LORAZEPAM (ATIVAN) 0.5 MG TABLET    Take 0.5 mg by mouth daily.    MAGNESIUM HYDROXIDE (MILK OF MAGNESIA) 400 MG/5ML SUSPENSION    Take 30 mLs by mouth. Every 72 hours as needed   MELATONIN PO    Take 6 mg by mouth at bedtime.   MULTIPLE VITAMIN (MULTIVITAMIN WITH MINERALS) TABS TABLET    Take 1 tablet by mouth daily.   NON FORMULARY    Magic cup by mouth three times daily   NUTRITIONAL SUPPLEMENTS (NUTRITIONAL SUPPLEMENT PO)    Take by mouth. Puree texture, Honey consistency Magic cup TID   OXYGEN    Inhale 2 L/min into the lungs as needed (low o2 levels).   PREDNISOLONE ACETATE (PRED FORTE) 1 % OPHTHALMIC SUSPENSION    Place 1 drop into the right eye 2 (two) times daily.    QUETIAPINE (SEROQUEL) 100 MG TABLET    Take 100 mg by mouth at bedtime.   QUETIAPINE (SEROQUEL) 25 MG TABLET    Take 50 mg by mouth. Give 50 mg in the morning   RANITIDINE (ZANTAC) 300 MG TABLET    Take 300 mg by mouth at bedtime.   SACCHAROMYCES BOULARDII (FLORASTOR) 250 MG CAPSULE    Take 250 mg by mouth 2 (two) times daily.   VITAMIN B-12 (CYANOCOBALAMIN) 1000 MCG TABLET    Take 1,000 mcg by mouth daily.  Modified Medications   No medications on file  Discontinued Medications   HYPROMELLOSE 0.4 % SOLN    Place 1 drop into the right eye 4 (four) times daily.   LEVOTHYROXINE (SYNTHROID, LEVOTHROID) 125 MCG TABLET    Take 125 mcg by mouth daily before breakfast.     SIGNIFICANT DIAGNOSTIC EXAMS  08-06-15: chest x-ray; 1. No acute abnormality. 2. Mild cardiomegaly. 3. Mild linear scarring at the left lung base.   08-06-15: ct of head and cervical spine: 1. No acute intracranial pathology.2. Nondisplaced fracture of the left inferior articulating facet of C6. There  is a sclerotic margin on either side of the fracture suggesting a  subacute or chronic injury. There is no other fracture or subluxation of the cervical spine. If there is further clinical concern, recommend MRI. There is surgical follow-up is recommended.  08-06-15: ct of thoracic spine: . Compression fractures of T8, T10 and T11. T10 and T11 are chronic, with the T8 fracture being of unclear chronicity.  08-31-15: EKG: sinus bradycardia  10-24-15: chest x-ray: minimal plate like atelectasis involving right upper lobe   02-29-16: chest x-ray: no evidence of acute cardiopulmonary disease  02-29-16: EKG: normal sinus rhythm with first degree AV block   03-20-16: chest x-ray:  early active process left upper lobe     LABS REVIEWED:   08-06-15: wbc 8.5; hgb 12.9; hct 41.7; mcv 96.3; plt 192; glucose 107; bun 18; creat 0.81; k+ 3.7; na++143 08-08-15: wbc 7.5; hgb 13.0; hct 42.9; mcv 99.8; plt 194;  08-10-15: wbc 5.4; hgb 12.1; hct 39.3; mcv 97.0; plt 191; glucose 96; bun 16; creat 0.64; k+ 4.3; na++139  08-12-15: glucose 97; bun 15; creat 0.73; k+ 4.5; na++141 08-19-15: glucose 83; bun 15; creat 0.71; k+ 4.6; na++143  10-14-15: wbc 7.3; hgb 14.0; hct 41.8; mcv 90.7 plt 232; glucose 111; bun 15; creat 0.75; k+ 4.1; na++142; liver normal albumin 3.7; tsh 2.917 vit B12; 904; folate 11.2  02-24-16: wbc 6.0; hgb 11.4; hct 34.5; mcv 95.6 plt 192; glucose 92; bun 20; creat 0.81; k+ 4.2; na++ 143; liver normal albumin 3.0; tsh 4.50; hgb a1c 5.7   06-27-16: wbc 7.0; hgb 11.9; hct 36.5; mcv 92.4 plt 221; glucose 88'; bun 19' creat 0.70; k+ 4.2; na++ 142 liver normal albumin 3.5; tsh 5.53; hgb a1c 5.0     Review of Systems  Unable to perform ROS: dementia     Physical Exam  Vitals reviewed. Constitutional: No distress. frail  Eyes: Conjunctivae are normal.  Neck: Neck supple. No JVD present. No thyromegaly present.  Cardiovascular: Normal rate, regular rhythm and intact distal pulses.   Respiratory: . No respiratory distress. She has no wheezes. breath sounds  diminished throughout.   GI: Soft. Bowel sounds are normal. She exhibits no distension. There is no tenderness.  Musculoskeletal: She exhibits no edema.  Able to move all extremities    Lymphadenopathy:    She has no cervical adenopathy.  Neurological: She is alert.  Skin: Skin is warm and dry. She is not diaphoretic.  Psychiatric: is calm      ASSESSMENT/ PLAN:  1. Schizophrenia  With  Major depression with psychosis: is being followed by psych services; will continue  celexa 20 mg daily ativan 0.5 mg daily  Will continue seroquel 50 mg  In the AM and 100 mg nightly   Is followed by psych services.   She is having delusional thoughts  2. Gerd: will continue zantac 300 mg nightly   3. Constipation:  Will continue colace 200 mg daily;   4. Glaucoma: will continue alphagan to right eye three times daily; cosopt to both eyes twice daily; pred forte to right eye four times daily will continue hypromellose to right eye four times daily   5. Hypertension: is presently stable is not on medications will monitor   6. Dysphagia: no signs of aspiration present will continue honey thick liquids   7. Weight loss: weight is stable at 114 pounds; will continue supplements per facility protocol and will monitor   8. Chronic respiratory failure with hypoxia: is on long term  02 therapy at 2 liters  Will continue duoneb every 8 hours as needed   will monitor her status.   9. Dysphagia: no signs of aspiration present; will continue honey thick liquids.  10. Hypothyroidism: will continue synthroid 12.5 mcg daily her labs are pending.     MD is aware of resident's narcotic use and is in agreement with current plan of care. We will attempt to wean resident as appropriate.   Synthia Innocent NP [facility]  Contact [phone] Monday through Friday 8am- 5pm  After hours call [phone]

## 2016-08-10 DIAGNOSIS — R634 Abnormal weight loss: Secondary | ICD-10-CM | POA: Insufficient documentation

## 2016-08-27 ENCOUNTER — Encounter: Payer: Self-pay | Admitting: Adult Health

## 2016-08-27 ENCOUNTER — Non-Acute Institutional Stay (SKILLED_NURSING_FACILITY): Payer: Medicare Other | Admitting: Adult Health

## 2016-08-27 DIAGNOSIS — H409 Unspecified glaucoma: Secondary | ICD-10-CM | POA: Diagnosis not present

## 2016-08-27 DIAGNOSIS — E034 Atrophy of thyroid (acquired): Secondary | ICD-10-CM | POA: Diagnosis not present

## 2016-08-27 DIAGNOSIS — R1314 Dysphagia, pharyngoesophageal phase: Secondary | ICD-10-CM

## 2016-08-27 DIAGNOSIS — K219 Gastro-esophageal reflux disease without esophagitis: Secondary | ICD-10-CM | POA: Diagnosis not present

## 2016-08-27 DIAGNOSIS — F333 Major depressive disorder, recurrent, severe with psychotic symptoms: Secondary | ICD-10-CM | POA: Diagnosis not present

## 2016-08-27 DIAGNOSIS — K5909 Other constipation: Secondary | ICD-10-CM

## 2016-08-27 DIAGNOSIS — F200 Paranoid schizophrenia: Secondary | ICD-10-CM | POA: Diagnosis not present

## 2016-08-27 DIAGNOSIS — J9611 Chronic respiratory failure with hypoxia: Secondary | ICD-10-CM | POA: Diagnosis not present

## 2016-08-27 NOTE — Progress Notes (Signed)
Patient ID: Ashley Swanson, female   DOB: 05/08/1929, 82 y.o.   MRN: [id number]   Provider:  Synthia Innocenteborah Merranda Bolls, NP Location:  [facility] Room Number: 110-A Place of Service:  SNF (31)   PCP: Ashley Swanson Patient Care Team: Ashley SenegalNelson [name], Swanson as PCP - General (Internal Medicine)  Extended Emergency Contact Information Primary Emergency Contact: Ashley Swanson Address: [address]          [place], [place] [address] [place] Home Phone: [phone] Mobile Phone: [phone] Relation: Daughter Secondary Emergency Contact: Ashley Swanson          [place], [place] Home Phone: [phone] Mobile Phone: [phone] Relation: Son  Code Status: DNR Goals of Care: Advanced Directive information Advanced Directives 08/27/2016  Does Patient Have a Medical Advance Directive? Yes  Type of Advance Directive Out of facility DNR (pink MOST or yellow form)  Does patient want to make changes to medical advance directive? -  Copy of Healthcare Power of Attorney in Chart? -  Would patient like information on creating a medical advance directive? -      Allergies  Allergen Reactions  . Ciprofloxacin     unknown  . Effexor [Venlafaxine]     Intolerance  . Enablex [Darifenacin Hydrobromide Er]     unknown  . Macrobid [employer][Nitrofurantoin Monohyd Macro]     unknown  . Metronidazole And Related     unknown  . Nsaids     unknown  . Reclast [Zoledronic Acid]     unknown  . Sulfa Antibiotics     unknown     Chief Complaint  Patient presents with  . Annual Exam    Annual    HPI: Patient is a 82 y.o. female seen today for an annual comprehensive examination. She has not been hospitalized; she does spend nearly all of her time in bed per her choice. She continue to delusional thought patterns. She is unable to fully participate in the hpi or ros; she did tell me today that she was feeling good. There are no nursing concerns at this  time.    Past Medical History:  Diagnosis Date  . Cataract   . Depression   . Diverticulosis   . GERD (gastroesophageal reflux disease)   . Hyperlipidemia   . Hypertension   . Osteoporosis   . Stasis dermatitis   . Thyroid disease    hypothyroid   Past Surgical History:  Procedure Laterality Date  . ABDOMINAL HYSTERECTOMY    . APPENDECTOMY    . BREAST SURGERY     biopsy  . CATARACT EXTRACTION    . CHOLECYSTECTOMY    . TONSILECTOMY/ADENOIDECTOMY WITH MYRINGOTOMY    . TUBAL LIGATION    . vertebrolplasty      reports that she has quit smoking. She does not have any smokeless tobacco history on file. She reports that she drinks alcohol. Her drug history is not on file. Social History   Social History  . Marital status: Widowed    Spouse name: N/A  . Number of children: N/A  . Years of education: N/A   Occupational History  . Not on file.   Social History Main Topics  . Smoking status: Former Games developermoker  . Smokeless tobacco: Not on file  . Alcohol use Yes  . Drug use: Unknown  . Sexual activity: Not on file   Other Topics Concern  . Not on file   Social History Narrative  . No narrative  on file   Family History  Problem Relation Age of Onset  . Aortic aneurysm Mother   . Cancer Father     mouth  . Diabetes Neg Hx   . Coronary artery disease Neg Hx     Vitals:   08/27/16 1516  BP: (!) 159/79  Pulse: 86  Resp: 16  Temp: 98.9 F (37.2 C)  TempSrc: Oral  SpO2: 95%  Weight: 113 lb 4 oz (51.4 kg)  Height: 5\' 2"  (1.575 m)   Body mass index is 20.71 kg/m.    Medication List       Accurate as of 08/27/16  3:24 PM. Always use your most recent med list.          acetaminophen 325 MG tablet Commonly known as:  TYLENOL Take 650 mg by mouth every 8 (eight) hours.   aluminum-magnesium hydroxide-simethicone 200-200-20 MG/5ML Susp Commonly known as:  MAALOX Take 15 mLs by mouth every 4 (four) hours as needed (reflux).   ANBESOL 10 % mucosal  gel Generic drug:  benzocaine Use as directed 1 application in the mouth or throat every 8 (eight) hours as needed for mouth pain.   benzonatate 100 MG capsule Commonly known as:  TESSALON Take 100 mg by mouth 3 (three) times daily as needed for cough.   bisacodyl 10 MG suppository Commonly known as:  DULCOLAX Place 10 mg rectally as needed for moderate constipation.   brimonidine 0.2 % ophthalmic solution Commonly known as:  ALPHAGAN Place 1 drop into the right eye 3 (three) times daily.   calcium carbonate 1250 (500 Ca) MG tablet Commonly known as:  OS-CAL - dosed in mg of elemental calcium Take 1 tablet by mouth daily with breakfast.   citalopram 20 MG tablet Commonly known as:  CELEXA Take 20 mg by mouth daily.   docusate sodium 100 MG capsule Commonly known as:  COLACE Take 200 mg by mouth every morning.   dorzolamide-timolol 22.3-6.8 MG/ML ophthalmic solution Commonly known as:  COSOPT Place 1 drop into both eyes 2 (two) times daily.   food thickener Powd Commonly known as:  THICK IT Take 1 Container by mouth as needed. HONEY   guaiFENesin 600 MG 12 hr tablet Commonly known as:  MUCINEX Take 600 mg by mouth every 12 (twelve) hours as needed (congestion).   hydrocortisone 2.5 % rectal cream Commonly known as:  ANUSOL-HC Place 1 application rectally 2 (two) times daily as needed.   hydrOXYzine 25 MG tablet Commonly known as:  ATARAX/VISTARIL Take 25 mg by mouth 2 (two) times daily as needed.   ipratropium-albuterol 0.5-2.5 (3) MG/3ML Soln Commonly known as:  DUONEB Take 3 mLs by nebulization every 8 (eight) hours as needed.   levothyroxine 25 MCG tablet Commonly known as:  SYNTHROID, LEVOTHROID Take 12.5 mcg by mouth daily before breakfast.   LORazepam 0.5 MG tablet Commonly known as:  ATIVAN Take 0.5 mg by mouth daily.   MELATONIN PO Take 6 mg by mouth at bedtime.   MILK OF MAGNESIA 400 MG/5ML suspension Generic drug:  magnesium hydroxide Take 30  mLs by mouth. Every 72 hours as needed   multivitamin with minerals Tabs tablet Take 1 tablet by mouth daily.   NON FORMULARY Magic cup by mouth three times daily   NUTRITIONAL SUPPLEMENT PO Take by mouth. Puree texture, Honey consistency Magic cup TID   OXYGEN Inhale 2 L/min into the lungs as needed (low o2 levels).   prednisoLONE acetate 1 % ophthalmic suspension Commonly known  as:  PRED FORTE Place 1 drop into the right eye 2 (two) times daily.   QUEtiapine 25 MG tablet Commonly known as:  SEROQUEL Take 50 mg by mouth. Give 50 mg in the morning   QUEtiapine 100 MG tablet Commonly known as:  SEROQUEL Take 100 mg by mouth at bedtime.   ranitidine 300 MG tablet Commonly known as:  ZANTAC Take 300 mg by mouth at bedtime.   vitamin B-12 1000 MCG tablet Commonly known as:  CYANOCOBALAMIN Take 1,000 mcg by mouth daily.        SIGNIFICANT DIAGNOSTIC EXAMS   08-06-15: ct of head and cervical spine: 1. No acute intracranial pathology.2. Nondisplaced fracture of the left inferior articulating facet of C6. There is a sclerotic margin on either side of the fracture suggesting a subacute or chronic injury. There is no other fracture or subluxation of the cervical spine. If there is further clinical concern, recommend MRI. There is surgical follow-up is recommended.  08-06-15: ct of thoracic spine: . Compression fractures of T8, T10 and T11. T10 and T11 are chronic, with the T8 fracture being of unclear chronicity.  08-31-15: EKG: sinus bradycardia  02-29-16: chest x-ray: no evidence of acute cardiopulmonary disease  02-29-16: EKG: normal sinus rhythm with Ashley degree AV block   03-20-16: chest x-ray:  early active process left upper lobe     LABS REVIEWED:   08-06-15: wbc 8.5; hgb 12.9; hct 41.7; mcv 96.3; plt 192; glucose 107; bun 18; creat 0.81; k+ 3.7; na++143 08-08-15: wbc 7.5; hgb 13.0; hct 42.9; mcv 99.8; plt 194;  08-10-15: wbc 5.4; hgb 12.1; hct 39.3; mcv 97.0; plt  191; glucose 96; bun 16; creat 0.64; k+ 4.3; na++139  08-12-15: glucose 97; bun 15; creat 0.73; k+ 4.5; na++141 08-19-15: glucose 83; bun 15; creat 0.71; k+ 4.6; na++143  10-14-15: wbc 7.3; hgb 14.0; hct 41.8; mcv 90.7 plt 232; glucose 111; bun 15; creat 0.75; k+ 4.1; na++142; liver normal albumin 3.7; tsh 2.917 vit B12; 904; folate 11.2  02-24-16: wbc 6.0; hgb 11.4; hct 34.5; mcv 95.6 plt 192; glucose 92; bun 20; creat 0.81; k+ 4.2; na++ 143; liver normal albumin 3.0; tsh 4.50; hgb a1c 5.7   06-27-16: wbc 7.0; hgb 11.9; hct 36.5; mcv 92.4 plt 221; glucose 88'; bun 19' creat 0.70; k+ 4.2; na++ 142 liver normal albumin 3.5; tsh 5.53; hgb a1c 5.0     Review of Systems  Unable to perform ROS: dementia     Physical Exam  Vitals reviewed. Constitutional: No distress. frail  Eyes: Conjunctivae are normal.  Neck: Neck supple. No JVD present. No thyromegaly present.  Cardiovascular: Normal rate, regular rhythm and intact distal pulses.   Respiratory: . No respiratory distress. She has no wheezes. breath sounds diminished throughout.   GI: Soft. Bowel sounds are normal. She exhibits no distension. There is no tenderness.  Musculoskeletal: She exhibits no edema.  Able to move all extremities    Lymphadenopathy:    She has no cervical adenopathy.  Neurological: She is alert.  Skin: Skin is warm and dry. She is not diaphoretic.  Psychiatric: is calm      ASSESSMENT/ PLAN:  1. Schizophrenia  With  Major depression with psychosis: is being followed by psych services; will continue  celexa 20 mg daily ativan 0.5 mg daily  Will continue seroquel 50 in the AM  100 mg nightly   Is followed by psych services.   She is having delusional thoughts  2. Genella RifeGerd: will continue  zantac 300 mg nightly   3. Constipation:  Will continue colace 200 mg daily;   4. Glaucoma: will continue alphagan to right eye three times daily; cosopt to both eyes twice daily;    5. Hypertension: is presently stable is not on  medications will monitor   6. Dysphagia: no signs of aspiration present will continue honey thick liquids   7. Weight loss: weight is stable at 113 pounds; will continue supplements per facility protocol and will monitor   8. Chronic respiratory failure with hypoxia: is on long term 02 therapy at 2 liters  Will continue duoneb every 8 hours as needed   will monitor her status.   9.  Hypothyroidism: will continue synthroid 12.5 mcg daily    Health maintenance is up to date   Time spent with patient 45   minutes >50% time spent counseling; reviewing medical record; tests; labs; and developing future plan of care    Swanson is aware of resident's narcotic use and is in agreement with current plan of care. We will wean dosage as appropriate for resident   Ashley Innocent NP [facility]  Contact [phone] Monday through Friday 8am- 5pm  After hours call [phone]

## 2016-08-28 LAB — TSH: TSH: 2.59 u[IU]/mL (ref 0.41–5.90)

## 2016-09-03 ENCOUNTER — Other Ambulatory Visit: Payer: Self-pay | Admitting: *Deleted

## 2016-09-03 MED ORDER — LORAZEPAM 0.5 MG PO TABS: ORAL_TABLET | ORAL | 1 refills | Status: DC

## 2016-09-03 NOTE — Telephone Encounter (Signed)
[facility] #: [phone] Fax#: [phone]  

## 2016-09-27 ENCOUNTER — Encounter: Payer: Self-pay | Admitting: Adult Health

## 2016-09-27 ENCOUNTER — Non-Acute Institutional Stay (SKILLED_NURSING_FACILITY): Payer: Medicare Other | Admitting: Adult Health

## 2016-09-27 DIAGNOSIS — F333 Major depressive disorder, recurrent, severe with psychotic symptoms: Secondary | ICD-10-CM

## 2016-09-27 DIAGNOSIS — E039 Hypothyroidism, unspecified: Secondary | ICD-10-CM | POA: Diagnosis not present

## 2016-09-27 DIAGNOSIS — H409 Unspecified glaucoma: Secondary | ICD-10-CM | POA: Diagnosis not present

## 2016-09-27 DIAGNOSIS — R1314 Dysphagia, pharyngoesophageal phase: Secondary | ICD-10-CM | POA: Diagnosis not present

## 2016-09-27 DIAGNOSIS — F200 Paranoid schizophrenia: Secondary | ICD-10-CM

## 2016-09-27 DIAGNOSIS — J9611 Chronic respiratory failure with hypoxia: Secondary | ICD-10-CM

## 2016-09-27 DIAGNOSIS — I10 Essential (primary) hypertension: Secondary | ICD-10-CM

## 2016-09-27 NOTE — Progress Notes (Signed)
Location:   [facility] Room Number: 110 A Place of Service:  SNF (31)   CODE STATUS: DNR  Allergies  Allergen Reactions  . Ciprofloxacin     unknown  . Effexor [Venlafaxine]     Intolerance  . Enablex [Darifenacin Hydrobromide Er]     unknown  . Macrobid [employer][Nitrofurantoin Monohyd Macro]     unknown  . Metronidazole And Related     unknown  . Nsaids     unknown  . Reclast [Zoledronic Acid]     unknown  . Sulfa Antibiotics     unknown    Chief Complaint  Patient presents with  . Medical Management of Chronic Issues    HPI:  She is a long term resident of this facility being seen for the management of her chronic illnesses. Overall there is little change in her status. She does spend nearly all of her time in bed per her choice. She is unable to fully participate in the hpi or ros. There are no nursing concerns at this time.    Past Medical History:  Diagnosis Date  . Cataract   . Depression   . Diverticulosis   . GERD (gastroesophageal reflux disease)   . Hyperlipidemia   . Hypertension   . Osteoporosis   . Stasis dermatitis   . Thyroid disease    hypothyroid    Past Surgical History:  Procedure Laterality Date  . ABDOMINAL HYSTERECTOMY    . APPENDECTOMY    . BREAST SURGERY     biopsy  . CATARACT EXTRACTION    . CHOLECYSTECTOMY    . TONSILECTOMY/ADENOIDECTOMY WITH MYRINGOTOMY    . TUBAL LIGATION    . vertebrolplasty      Social History   Social History  . Marital status: Widowed    Spouse name: N/A  . Number of children: N/A  . Years of education: N/A   Occupational History  . Not on file.   Social History Main Topics  . Smoking status: Former Games developermoker  . Smokeless tobacco: Not on file  . Alcohol use Yes  . Drug use: Unknown  . Sexual activity: Not on file   Other Topics Concern  . Not on file   Social History Narrative  . No narrative on file   Family History  Problem Relation Age of Onset  . Aortic aneurysm Mother     . Cancer Father     mouth  . Diabetes Neg Hx   . Coronary artery disease Neg Hx       VITAL SIGNS BP 130/63   Pulse 73   Temp 97.5 F (36.4 C)   Resp 18   Ht 5\' 2"  (1.575 m)   Wt 110 lb 9.6 oz (50.2 kg)   SpO2 97% Comment: O2  BMI 20.23 kg/m   Patient's Medications  New Prescriptions   No medications on file  Previous Medications   ACETAMINOPHEN (TYLENOL) 325 MG TABLET    Take 650 mg by mouth every 8 (eight) hours.   ALUMINUM-MAGNESIUM HYDROXIDE-SIMETHICONE (MAALOX) 200-200-20 MG/5ML SUSP    Take 15 mLs by mouth every 4 (four) hours as needed (reflux).   BENZOCAINE (ANBESOL) 10 % MUCOSAL GEL    Use as directed 1 application in the mouth or throat every 8 (eight) hours as needed for mouth pain.    BENZONATATE (TESSALON) 100 MG CAPSULE    Take 100 mg by mouth 3 (three) times daily as needed for cough.   BISACODYL (DULCOLAX) 10  MG SUPPOSITORY    Place 10 mg rectally as needed for moderate constipation.   BRIMONIDINE (ALPHAGAN) 0.2 % OPHTHALMIC SOLUTION    Place 1 drop into the right eye 3 (three) times daily.   CALCIUM CARBONATE (OS-CAL - DOSED IN MG OF ELEMENTAL CALCIUM) 1250 (500 CA) MG TABLET    Take 1 tablet by mouth daily with breakfast.   CITALOPRAM (CELEXA) 20 MG TABLET    Take 20 mg by mouth daily.    DOCUSATE SODIUM (COLACE) 100 MG CAPSULE    Take 200 mg by mouth every morning.   DORZOLAMIDE-TIMOLOL (COSOPT) 22.3-6.8 MG/ML OPHTHALMIC SOLUTION    Place 1 drop into both eyes 2 (two) times daily.   FOOD THICKENER (THICK IT) POWD    Take 1 Container by mouth as needed. HONEY   GUAIFENESIN (MUCINEX) 600 MG 12 HR TABLET    Take 600 mg by mouth every 12 (twelve) hours as needed (congestion).   HYDROCORTISONE (ANUSOL-HC) 2.5 % RECTAL CREAM    Place 1 application rectally 2 (two) times daily as needed.    HYDROXYZINE (ATARAX/VISTARIL) 25 MG TABLET    Take 25 mg by mouth 2 (two) times daily as needed.   IPRATROPIUM-ALBUTEROL (DUONEB) 0.5-2.5 (3) MG/3ML SOLN    Take 3 mLs by  nebulization every 8 (eight) hours as needed.    LEVOTHYROXINE (SYNTHROID, LEVOTHROID) 25 MCG TABLET    Take 12.5 mcg by mouth daily before breakfast.   LORAZEPAM (ATIVAN) 0.5 MG TABLET    Take one tablet by mouth every 24 hours as needed for anxiety   MAGNESIUM HYDROXIDE (MILK OF MAGNESIA) 400 MG/5ML SUSPENSION    Take 30 mLs by mouth. Every 72 hours as needed   MELATONIN PO    Take 6 mg by mouth at bedtime.   MULTIPLE VITAMIN (MULTIVITAMIN WITH MINERALS) TABS TABLET    Take 1 tablet by mouth daily.   NON FORMULARY    Magic cup by mouth three times daily   NUTRITIONAL SUPPLEMENTS (NUTRITIONAL SUPPLEMENT PO)    Take by mouth. Puree texture, Honey consistency Magic cup TID   OXYGEN    Inhale 2 L/min into the lungs as needed (low o2 levels).   PREDNISOLONE ACETATE (PRED FORTE) 1 % OPHTHALMIC SUSPENSION    Place 1 drop into the right eye 2 (two) times daily.    QUETIAPINE (SEROQUEL) 100 MG TABLET    Take 100 mg by mouth at bedtime.   QUETIAPINE (SEROQUEL) 25 MG TABLET    Take 50 mg by mouth. Give 50 mg in the morning   RANITIDINE (ZANTAC) 300 MG TABLET    Take 300 mg by mouth at bedtime.   VITAMIN B-12 (CYANOCOBALAMIN) 1000 MCG TABLET    Take 1,000 mcg by mouth daily.  Modified Medications   No medications on file  Discontinued Medications   No medications on file     SIGNIFICANT DIAGNOSTIC EXAMS    08-06-15: ct of head and cervical spine: 1. No acute intracranial pathology.2. Nondisplaced fracture of the left inferior articulating facet of C6. There is a sclerotic margin on either side of the fracture suggesting a subacute or chronic injury. There is no other fracture or subluxation of the cervical spine. If there is further clinical concern, recommend MRI. There is surgical follow-up is recommended.  08-06-15: ct of thoracic spine: . Compression fractures of T8, T10 and T11. T10 and T11 are chronic, with the T8 fracture being of unclear chronicity.  08-31-15: EKG: sinus  bradycardia  02-29-16:  chest x-ray: no evidence of acute cardiopulmonary disease  02-29-16: EKG: normal sinus rhythm with first degree AV block   03-20-16: chest x-ray:  early active process left upper lobe     LABS REVIEWED:   02-24-16: wbc 6.0; hgb 11.4; hct 34.5; mcv 95.6 plt 192; glucose 92; bun 20; creat 0.81; k+ 4.2; na++ 143; liver normal albumin 3.0; tsh 4.50; hgb a1c 5.7   06-27-16: wbc 7.0; hgb 11.9; hct 36.5; mcv 92.4 plt 221; glucose 88'; bun 19' creat 0.70; k+ 4.2; na++ 142 liver normal albumin 3.5; tsh 5.53; hgb a1c 5.0  09-01-16: urine culture: no growth 08-28-16: tsh 2.59   Review of Systems  Unable to perform ROS: dementia     Physical Exam  Vitals reviewed. Constitutional: No distress. frail  Eyes: Conjunctivae are normal.  Neck: Neck supple. No JVD present. No thyromegaly present.  Cardiovascular: Normal rate, regular rhythm and intact distal pulses.   Respiratory: . No respiratory distress. She has no wheezes. breath sounds diminished throughout.   GI: Soft. Bowel sounds are normal. She exhibits no distension. There is no tenderness.  Musculoskeletal: She exhibits no edema.  Able to move all extremities    Lymphadenopathy:    She has no cervical adenopathy.  Neurological: She is alert.  Skin: Skin is warm and dry. She is not diaphoretic.  Psychiatric: is calm      ASSESSMENT/ PLAN:  1. Schizophrenia  With  Major depression with psychosis: is being followed by psych services; will continue  celexa 20 mg daily ativan 0.5 mg daily  Will continue seroquel 50 in the AM  100 mg nightly   Is followed by psych services.   She continues to have delusional thoughts  2. Gerd: will continue zantac 300 mg nightly   3. Constipation:  Will continue colace 200 mg daily;   4. Glaucoma: will continue alphagan to right eye three times daily; cosopt to both eyes twice daily;    5. Hypertension: is presently stable is not on medications will monitor   6. Dysphagia: no  signs of aspiration present will continue honey thick liquids   7. Weight loss: weight is stable at 110 pounds; will continue supplements per facility protocol and will monitor   8. Chronic respiratory failure with hypoxia: is on long term 02 therapy at 2 liters  Will continue duoneb every 8 hours as needed   will monitor her status.   9.  Hypothyroidism: tsh 2.59 will continue synthroid 12.5 mcg daily        Synthia Innocenteborah Teodora Baumgarten NP [facility]  Contact [phone] Monday through Friday 8am- 5pm  After hours call [phone]

## 2016-10-10 ENCOUNTER — Other Ambulatory Visit: Payer: Self-pay

## 2016-10-10 ENCOUNTER — Emergency Department (HOSPITAL_COMMUNITY): Payer: Medicare Other

## 2016-10-10 ENCOUNTER — Inpatient Hospital Stay (HOSPITAL_COMMUNITY)
Admission: EM | Admit: 2016-10-10 | Discharge: 2016-10-12 | DRG: 193 | Disposition: A | Discharge status: Skilled Nursing Facility | Payer: Medicare Other | Attending: Family Medicine | Admitting: Family Medicine

## 2016-10-10 ENCOUNTER — Inpatient Hospital Stay (HOSPITAL_COMMUNITY): Payer: Medicare Other

## 2016-10-10 ENCOUNTER — Encounter (HOSPITAL_COMMUNITY): Payer: Self-pay

## 2016-10-10 DIAGNOSIS — Z87891 Personal history of nicotine dependence: Secondary | ICD-10-CM

## 2016-10-10 DIAGNOSIS — L89151 Pressure ulcer of sacral region, stage 1: Secondary | ICD-10-CM | POA: Diagnosis present

## 2016-10-10 DIAGNOSIS — K219 Gastro-esophageal reflux disease without esophagitis: Secondary | ICD-10-CM | POA: Diagnosis present

## 2016-10-10 DIAGNOSIS — Z9981 Dependence on supplemental oxygen: Secondary | ICD-10-CM

## 2016-10-10 DIAGNOSIS — J9621 Acute and chronic respiratory failure with hypoxia: Secondary | ICD-10-CM | POA: Diagnosis present

## 2016-10-10 DIAGNOSIS — N390 Urinary tract infection, site not specified: Secondary | ICD-10-CM | POA: Diagnosis present

## 2016-10-10 DIAGNOSIS — Z90710 Acquired absence of both cervix and uterus: Secondary | ICD-10-CM | POA: Diagnosis not present

## 2016-10-10 DIAGNOSIS — F333 Major depressive disorder, recurrent, severe with psychotic symptoms: Secondary | ICD-10-CM | POA: Diagnosis present

## 2016-10-10 DIAGNOSIS — Z9849 Cataract extraction status, unspecified eye: Secondary | ICD-10-CM

## 2016-10-10 DIAGNOSIS — Z800 Family history of malignant neoplasm of digestive organs: Secondary | ICD-10-CM

## 2016-10-10 DIAGNOSIS — N179 Acute kidney failure, unspecified: Secondary | ICD-10-CM | POA: Diagnosis present

## 2016-10-10 DIAGNOSIS — M810 Age-related osteoporosis without current pathological fracture: Secondary | ICD-10-CM | POA: Diagnosis present

## 2016-10-10 DIAGNOSIS — E039 Hypothyroidism, unspecified: Secondary | ICD-10-CM | POA: Diagnosis present

## 2016-10-10 DIAGNOSIS — I10 Essential (primary) hypertension: Secondary | ICD-10-CM | POA: Diagnosis present

## 2016-10-10 DIAGNOSIS — Z882 Allergy status to sulfonamides status: Secondary | ICD-10-CM

## 2016-10-10 DIAGNOSIS — E860 Dehydration: Secondary | ICD-10-CM | POA: Diagnosis present

## 2016-10-10 DIAGNOSIS — E785 Hyperlipidemia, unspecified: Secondary | ICD-10-CM | POA: Diagnosis present

## 2016-10-10 DIAGNOSIS — H409 Unspecified glaucoma: Secondary | ICD-10-CM | POA: Diagnosis present

## 2016-10-10 DIAGNOSIS — Z888 Allergy status to other drugs, medicaments and biological substances status: Secondary | ICD-10-CM | POA: Diagnosis not present

## 2016-10-10 DIAGNOSIS — Z886 Allergy status to analgesic agent status: Secondary | ICD-10-CM

## 2016-10-10 DIAGNOSIS — F200 Paranoid schizophrenia: Secondary | ICD-10-CM

## 2016-10-10 DIAGNOSIS — Z881 Allergy status to other antibiotic agents status: Secondary | ICD-10-CM | POA: Diagnosis not present

## 2016-10-10 DIAGNOSIS — L8990 Pressure ulcer of unspecified site, unspecified stage: Secondary | ICD-10-CM | POA: Insufficient documentation

## 2016-10-10 DIAGNOSIS — S72434A Nondisplaced fracture of medial condyle of right femur, initial encounter for closed fracture: Secondary | ICD-10-CM | POA: Diagnosis present

## 2016-10-10 DIAGNOSIS — M8580 Other specified disorders of bone density and structure, unspecified site: Secondary | ICD-10-CM | POA: Diagnosis present

## 2016-10-10 DIAGNOSIS — Y95 Nosocomial condition: Secondary | ICD-10-CM | POA: Diagnosis present

## 2016-10-10 DIAGNOSIS — F0390 Unspecified dementia without behavioral disturbance: Secondary | ICD-10-CM | POA: Diagnosis present

## 2016-10-10 DIAGNOSIS — J189 Pneumonia, unspecified organism: Secondary | ICD-10-CM | POA: Diagnosis present

## 2016-10-10 LAB — CBC WITH DIFFERENTIAL/PLATELET
BASOS ABS: 0 10*3/uL (ref 0.0–0.1)
Basophils Relative: 0 %
Eosinophils Absolute: 0 10*3/uL (ref 0.0–0.7)
Eosinophils Relative: 0 %
HEMATOCRIT: 34.9 % — AB (ref 36.0–46.0)
Hemoglobin: 11.5 g/dL — ABNORMAL LOW (ref 12.0–15.0)
LYMPHS ABS: 0.8 10*3/uL (ref 0.7–4.0)
Lymphocytes Relative: 4 %
MCH: 31.3 pg (ref 26.0–34.0)
MCHC: 33 g/dL (ref 30.0–36.0)
MCV: 94.8 fL (ref 78.0–100.0)
MONOS PCT: 6 %
Monocytes Absolute: 1.2 10*3/uL — ABNORMAL HIGH (ref 0.1–1.0)
Neutro Abs: 17.9 10*3/uL — ABNORMAL HIGH (ref 1.7–7.7)
Neutrophils Relative %: 90 %
PLATELETS: 338 10*3/uL (ref 150–400)
RBC: 3.68 MIL/uL — AB (ref 3.87–5.11)
RDW: 14.3 % (ref 11.5–15.5)
WBC Morphology: INCREASED
WBC: 19.9 10*3/uL — ABNORMAL HIGH (ref 4.0–10.5)

## 2016-10-10 LAB — URINALYSIS, ROUTINE W REFLEX MICROSCOPIC
Bilirubin Urine: NEGATIVE
GLUCOSE, UA: NEGATIVE mg/dL
Hgb urine dipstick: NEGATIVE
Ketones, ur: NEGATIVE mg/dL
NITRITE: NEGATIVE
PH: 8 (ref 5.0–8.0)
Protein, ur: 100 mg/dL — AB
SPECIFIC GRAVITY, URINE: 1.025 (ref 1.005–1.030)

## 2016-10-10 LAB — CBC
HCT: 33 % — ABNORMAL LOW (ref 36.0–46.0)
HEMOGLOBIN: 10.3 g/dL — AB (ref 12.0–15.0)
MCH: 30.5 pg (ref 26.0–34.0)
MCHC: 31.2 g/dL (ref 30.0–36.0)
MCV: 97.6 fL (ref 78.0–100.0)
PLATELETS: 336 10*3/uL (ref 150–400)
RBC: 3.38 MIL/uL — AB (ref 3.87–5.11)
RDW: 14.6 % (ref 11.5–15.5)
WBC: 18 10*3/uL — AB (ref 4.0–10.5)

## 2016-10-10 LAB — COMPREHENSIVE METABOLIC PANEL
ALT: 11 U/L — ABNORMAL LOW (ref 14–54)
AST: 19 U/L (ref 15–41)
Albumin: 3.2 g/dL — ABNORMAL LOW (ref 3.5–5.0)
Alkaline Phosphatase: 82 U/L (ref 38–126)
Anion gap: 11 (ref 5–15)
BILIRUBIN TOTAL: 0.8 mg/dL (ref 0.3–1.2)
BUN: 56 mg/dL — AB (ref 6–20)
CO2: 27 mmol/L (ref 22–32)
Calcium: 9.6 mg/dL (ref 8.9–10.3)
Chloride: 111 mmol/L (ref 101–111)
Creatinine, Ser: 1.41 mg/dL — ABNORMAL HIGH (ref 0.44–1.00)
GFR, EST AFRICAN AMERICAN: 38 mL/min — AB (ref >60)
GFR, EST NON AFRICAN AMERICAN: 33 mL/min — AB (ref >60)
Glucose, Bld: 147 mg/dL — ABNORMAL HIGH (ref 65–99)
POTASSIUM: 4.3 mmol/L (ref 3.5–5.1)
Sodium: 149 mmol/L — ABNORMAL HIGH (ref 135–145)
TOTAL PROTEIN: 7.9 g/dL (ref 6.5–8.1)

## 2016-10-10 LAB — MRSA PCR SCREENING: MRSA BY PCR: POSITIVE — AB

## 2016-10-10 LAB — CREATININE, SERUM
Creatinine, Ser: 1.07 mg/dL — ABNORMAL HIGH (ref 0.44–1.00)
GFR calc non Af Amer: 46 mL/min — ABNORMAL LOW (ref >60)
GFR, EST AFRICAN AMERICAN: 53 mL/min — AB (ref >60)

## 2016-10-10 LAB — I-STAT CG4 LACTIC ACID, ED
LACTIC ACID, VENOUS: 2.35 mmol/L — AB (ref 0.5–1.9)
LACTIC ACID, VENOUS: 2.27 mmol/L — AB (ref 0.5–1.9)

## 2016-10-10 LAB — I-STAT TROPONIN, ED: Troponin i, poc: 0.04 ng/mL (ref 0.00–0.08)

## 2016-10-10 LAB — LACTIC ACID, PLASMA: Lactic Acid, Venous: 1.8 mmol/L (ref 0.5–1.9)

## 2016-10-10 MED ORDER — ENOXAPARIN SODIUM 30 MG/0.3ML ~~LOC~~ SOLN
30.0000 mg | SUBCUTANEOUS | Status: DC
  Administered 2016-10-10: 30 mg via SUBCUTANEOUS
  Filled 2016-10-10: qty 0.3

## 2016-10-10 MED ORDER — DORZOLAMIDE HCL-TIMOLOL MAL 2-0.5 % OP SOLN
1.0000 [drp] | Freq: Two times a day (BID) | OPHTHALMIC | Status: DC
  Administered 2016-10-10 – 2016-10-12 (×4): 1 [drp] via OPHTHALMIC
  Filled 2016-10-10: qty 10

## 2016-10-10 MED ORDER — SODIUM CHLORIDE 0.9 % IV SOLN
Freq: Once | INTRAVENOUS | Status: AC
  Administered 2016-10-10: 15:00:00 via INTRAVENOUS

## 2016-10-10 MED ORDER — VITAMIN B-12 1000 MCG PO TABS
1000.0000 ug | ORAL_TABLET | Freq: Every day | ORAL | Status: DC
  Administered 2016-10-11 – 2016-10-12 (×2): 1000 ug via ORAL
  Filled 2016-10-10 (×2): qty 1

## 2016-10-10 MED ORDER — POLYVINYL ALCOHOL 1.4 % OP SOLN
1.0000 [drp] | Freq: Four times a day (QID) | OPHTHALMIC | Status: DC
  Administered 2016-10-10 – 2016-10-12 (×7): 1 [drp] via OPHTHALMIC
  Filled 2016-10-10: qty 15

## 2016-10-10 MED ORDER — ACETAMINOPHEN 325 MG PO TABS
650.0000 mg | ORAL_TABLET | Freq: Three times a day (TID) | ORAL | Status: DC
  Administered 2016-10-11 – 2016-10-12 (×4): 650 mg via ORAL
  Filled 2016-10-10 (×5): qty 2

## 2016-10-10 MED ORDER — SODIUM CHLORIDE 0.9 % IV SOLN: Freq: Once | INTRAVENOUS | Status: DC

## 2016-10-10 MED ORDER — CITALOPRAM HYDROBROMIDE 20 MG PO TABS
20.0000 mg | ORAL_TABLET | Freq: Every day | ORAL | Status: DC
Start: 2016-10-10 — End: 2016-10-12
  Administered 2016-10-11 – 2016-10-12 (×2): 20 mg via ORAL
  Filled 2016-10-10 (×2): qty 1

## 2016-10-10 MED ORDER — DOCUSATE SODIUM 100 MG PO CAPS
200.0000 mg | ORAL_CAPSULE | Freq: Every morning | ORAL | Status: DC
  Administered 2016-10-11 – 2016-10-12 (×2): 200 mg via ORAL
  Filled 2016-10-10 (×2): qty 2

## 2016-10-10 MED ORDER — PREDNISOLONE ACETATE 1 % OP SUSP
1.0000 [drp] | Freq: Two times a day (BID) | OPHTHALMIC | Status: DC
  Administered 2016-10-10 – 2016-10-12 (×4): 1 [drp] via OPHTHALMIC
  Filled 2016-10-10: qty 1

## 2016-10-10 MED ORDER — CALCIUM CARBONATE 1250 (500 CA) MG PO TABS
1.0000 | ORAL_TABLET | Freq: Every day | ORAL | Status: DC
  Administered 2016-10-11 – 2016-10-12 (×2): 500 mg via ORAL
  Filled 2016-10-10 (×2): qty 1

## 2016-10-10 MED ORDER — MORPHINE SULFATE (PF) 2 MG/ML IV SOLN
2.0000 mg | Freq: Once | INTRAVENOUS | Status: AC
  Administered 2016-10-11: 2 mg via INTRAVENOUS
  Filled 2016-10-10: qty 1

## 2016-10-10 MED ORDER — ACETAMINOPHEN 325 MG PO TABS
650.0000 mg | ORAL_TABLET | Freq: Once | ORAL | Status: AC
  Administered 2016-10-10: 650 mg via ORAL
  Filled 2016-10-10: qty 2

## 2016-10-10 MED ORDER — SODIUM CHLORIDE 0.9 % IV BOLUS (SEPSIS)
1000.0000 mL | Freq: Once | INTRAVENOUS | Status: AC
  Administered 2016-10-10: 1000 mL via INTRAVENOUS

## 2016-10-10 MED ORDER — MUPIROCIN 2 % EX OINT
1.0000 "application " | TOPICAL_OINTMENT | Freq: Two times a day (BID) | CUTANEOUS | Status: DC
  Administered 2016-10-10 – 2016-10-12 (×4): 1 via NASAL
  Filled 2016-10-10 (×2): qty 22

## 2016-10-10 MED ORDER — CHLORHEXIDINE GLUCONATE CLOTH 2 % EX PADS
6.0000 | MEDICATED_PAD | Freq: Every day | CUTANEOUS | Status: DC
  Administered 2016-10-11 – 2016-10-12 (×2): 6 via TOPICAL

## 2016-10-10 MED ORDER — GUAIFENESIN ER 600 MG PO TB12: 600.0000 mg | ORAL_TABLET | Freq: Two times a day (BID) | ORAL | Status: DC | PRN

## 2016-10-10 MED ORDER — VANCOMYCIN HCL IN DEXTROSE 1-5 GM/200ML-% IV SOLN
1000.0000 mg | Freq: Once | INTRAVENOUS | Status: AC
  Administered 2016-10-10: 1000 mg via INTRAVENOUS
  Filled 2016-10-10: qty 200

## 2016-10-10 MED ORDER — CEFEPIME HCL 1 G IJ SOLR
1.0000 g | INTRAMUSCULAR | Status: DC
  Administered 2016-10-11 – 2016-10-12 (×2): 1 g via INTRAVENOUS
  Filled 2016-10-10 (×2): qty 1

## 2016-10-10 MED ORDER — LEVOTHYROXINE SODIUM 25 MCG PO TABS
12.5000 ug | ORAL_TABLET | Freq: Every day | ORAL | Status: DC
  Administered 2016-10-11: 12.5 ug via ORAL

## 2016-10-10 MED ORDER — SODIUM CHLORIDE 0.9 % IV SOLN
INTRAVENOUS | Status: AC
  Administered 2016-10-10 – 2016-10-11 (×2): via INTRAVENOUS

## 2016-10-10 MED ORDER — BENZONATATE 100 MG PO CAPS: 100.0000 mg | ORAL_CAPSULE | Freq: Three times a day (TID) | ORAL | Status: DC | PRN

## 2016-10-10 MED ORDER — HYPROMELLOSE 0.4 % OP SOLN: 1.0000 [drp] | Freq: Four times a day (QID) | OPHTHALMIC | Status: DC

## 2016-10-10 MED ORDER — VANCOMYCIN HCL 500 MG IV SOLR
500.0000 mg | INTRAVENOUS | Status: DC
  Administered 2016-10-11: 500 mg via INTRAVENOUS
  Filled 2016-10-10 (×2): qty 500

## 2016-10-10 MED ORDER — FAMOTIDINE 20 MG PO TABS
40.0000 mg | ORAL_TABLET | Freq: Every day | ORAL | Status: DC
  Administered 2016-10-11: 40 mg via ORAL
  Filled 2016-10-10 (×2): qty 2

## 2016-10-10 MED ORDER — QUETIAPINE FUMARATE 50 MG PO TABS
150.0000 mg | ORAL_TABLET | Freq: Every day | ORAL | Status: DC
  Administered 2016-10-11: 150 mg via ORAL
  Filled 2016-10-10 (×2): qty 3

## 2016-10-10 MED ORDER — CLONAZEPAM 0.5 MG PO TABS
0.2500 mg | ORAL_TABLET | Freq: Every day | ORAL | Status: DC | PRN
  Administered 2016-10-12: 0.25 mg via ORAL
  Filled 2016-10-10: qty 1

## 2016-10-10 MED ORDER — DEXTROSE 5 % IV SOLN
2.0000 g | Freq: Once | INTRAVENOUS | Status: AC
  Administered 2016-10-10: 2 g via INTRAVENOUS
  Filled 2016-10-10: qty 2

## 2016-10-10 MED ORDER — BRIMONIDINE TARTRATE 0.2 % OP SOLN
1.0000 [drp] | Freq: Three times a day (TID) | OPHTHALMIC | Status: DC
  Administered 2016-10-10 – 2016-10-12 (×6): 1 [drp] via OPHTHALMIC
  Filled 2016-10-10: qty 5

## 2016-10-10 NOTE — ED Triage Notes (Signed)
[facility]. She arrives in no distress.

## 2016-10-10 NOTE — ED Notes (Signed)
EKG was completed by Toni Amendourtney NT.  Nurses aware.  14:05 completion time.

## 2016-10-10 NOTE — Progress Notes (Signed)
CRITICAL VALUE STICKER  CRITICAL VALUE: POSITIVE MRSA PCR  RECEIVER (on-site recipient of call): Billy FischerLynn West [place]ine, RN  DATE & TIME NOTIFIED: 10/10/16 2220  MESSENGER (representative from lab): LAB TECHNICIAN  MD NOTIFIED: K. Schorr  TIME OF NOTIFICATION:  10/10/16 2300  RESPONSE: Order set implemented  Patient NPO except sips with meds. Unable to take medications whole, requires medications crushed with puree. Patient c/o LE pain and hollers with movement. Notified K. Schorr for IV pain medication. Awaiting orders at this time.

## 2016-10-10 NOTE — ED Notes (Signed)
I have just given report to Lupita Leashonna, RN on [address], and will transport shortly.

## 2016-10-10 NOTE — H&P (Signed)
History and Physical    Ashley Swanson ZOX:[id number] DOB: 12/05/1928 DOA: 10/10/2016  PCP: [name], MD  Patient coming from: [facility]  Chief Complaint: Fever and cough  HPI: Ashley Swanson is a 83 y.o. female with medical history significant of hypertension, hypothyroidism, chronic respiratory failure, glaucoma. Patient states she started having some productive coughing with associated increased dyspnea. She is unsure when her symptoms started. Her oxygen was increased from 2L to 4L which helped with dyspnea. No associated chest pain, nausea or vomiting. No palpitations.  ED Course: Vitals: Afebrile. Normotensive. Normal pulse. On 4L O2 via  Labs: Sodium of 149, creatine of 1.41, BUN of 56, WBC of 19.9K, hemoglobin of 11.5 Imaging: CXR significant for LLL consolidation Medications/Course: Vancomycin and cefepime started. 1L NS bolus given  Review of Systems: Review of Systems  Constitutional: Negative for chills.  Respiratory: Positive for cough, sputum production and shortness of breath.   Cardiovascular: Negative for chest pain.  Gastrointestinal: Negative for abdominal pain, constipation, diarrhea, nausea and vomiting.  Musculoskeletal: Positive for joint pain. Negative for falls.  All other systems reviewed and are negative.   Past Medical History:  Diagnosis Date  . Cataract   . Depression   . Diverticulosis   . GERD (gastroesophageal reflux disease)   . Hyperlipidemia   . Hypertension   . Osteoporosis   . Stasis dermatitis   . Thyroid disease    hypothyroid    Past Surgical History:  Procedure Laterality Date  . ABDOMINAL HYSTERECTOMY    . APPENDECTOMY    . BREAST SURGERY     biopsy  . CATARACT EXTRACTION    . CHOLECYSTECTOMY    . TONSILECTOMY/ADENOIDECTOMY WITH MYRINGOTOMY    . TUBAL LIGATION    . vertebrolplasty       reports that she has quit smoking. She does not have any smokeless tobacco history on file.  She reports that she drinks alcohol. Her drug history is not on file.  Allergies  Allergen Reactions  . Ciprofloxacin     unknown  . Effexor [Venlafaxine]     Intolerance  . Enablex [Darifenacin Hydrobromide Er]     unknown  . Macrobid [employer] Macro]     unknown  . Metronidazole And Related     unknown  . Nsaids     unknown  . Reclast [Zoledronic Acid]     unknown  . Sulfa Antibiotics     unknown    Family History  Problem Relation Age of Onset  . Aortic aneurysm Mother   . Cancer Father     mouth  . Diabetes Neg Hx   . Coronary artery disease Neg Hx     Prior to Admission medications   Medication Sig Start Date End Date Taking? Authorizing Provider  acetaminophen (TYLENOL) 325 MG tablet Take 650 mg by mouth every 8 (eight) hours.   Yes Historical Provider, MD  aluminum-magnesium hydroxide-simethicone (MAALOX) 200-200-20 MG/5ML SUSP Take 15 mLs by mouth every 4 (four) hours as needed (reflux).   Yes Historical Provider, MD  benzocaine (ANBESOL) 10 % mucosal gel Use as directed 1 application in the mouth or throat every 8 (eight) hours as needed for mouth pain.    Yes Historical Provider, MD  benzonatate (TESSALON) 100 MG capsule Take 100 mg by mouth 3 (three) times daily as needed for cough.   Yes Historical Provider, MD  bisacodyl (DULCOLAX) 10 MG suppository Place 10 mg rectally as needed for moderate constipation.  Yes Historical Provider, MD  brimonidine (ALPHAGAN) 0.2 % ophthalmic solution Place 1 drop into the right eye 3 (three) times daily.   Yes Historical Provider, MD  calcium carbonate (OS-CAL - DOSED IN MG OF ELEMENTAL CALCIUM) 1250 (500 CA) MG tablet Take 1 tablet by mouth daily with breakfast.   Yes Historical Provider, MD  citalopram (CELEXA) 20 MG tablet Take 20 mg by mouth daily.    Yes Historical Provider, MD  clonazePAM (KLONOPIN) 0.5 MG tablet Take 0.25 mg by mouth daily as needed for anxiety. 10/01/16  Yes Historical Provider, MD    dorzolamide-timolol (COSOPT) 22.3-6.8 MG/ML ophthalmic solution Place 1 drop into both eyes 2 (two) times daily.   Yes Historical Provider, MD  guaiFENesin (MUCINEX) 600 MG 12 hr tablet Take 600 mg by mouth every 12 (twelve) hours as needed (congestion).   Yes Historical Provider, MD  hydrocortisone (ANUSOL-HC) 2.5 % rectal cream Place 1 application rectally 2 (two) times daily as needed (itching, hemorrhoids.).    Yes Historical Provider, MD  hydrOXYzine (ATARAX/VISTARIL) 25 MG tablet Take 25 mg by mouth 2 (two) times daily as needed for itching.    Yes Historical Provider, MD  Hypromellose (ARTIFICIAL TEARS) 0.4 % SOLN Apply 1 drop to eye 4 (four) times daily.   Yes Historical Provider, MD  ipratropium-albuterol (DUONEB) 0.5-2.5 (3) MG/3ML SOLN Take 3 mLs by nebulization 3 (three) times daily. For PNA.   Yes Historical Provider, MD  levothyroxine (SYNTHROID, LEVOTHROID) 25 MCG tablet Take 12.5 mcg by mouth daily before breakfast.   Yes Historical Provider, MD  magnesium hydroxide (MILK OF MAGNESIA) 400 MG/5ML suspension Take 30 mLs by mouth. Every 72 hours as needed   Yes Historical Provider, MD  MELATONIN PO Take 6 mg by mouth at bedtime.   Yes Historical Provider, MD  Multiple Vitamin (MULTIVITAMIN WITH MINERALS) TABS tablet Take 1 tablet by mouth daily.   Yes Historical Provider, MD  Ostomy Supplies (SKIN PREP WIPES) MISC 1 application by Does not apply route daily. Apply topically to R and L heel every day shift for preventative. Ensure feet are floating with pillow.   Yes Historical Provider, MD  OXYGEN Inhale 2 L/min into the lungs as needed (low o2 levels).   Yes Historical Provider, MD  prednisoLONE acetate (PRED FORTE) 1 % ophthalmic suspension Place 1 drop into the right eye 2 (two) times daily.    Yes Historical Provider, MD  QUEtiapine (SEROQUEL) 100 MG tablet Take 100 mg by mouth at bedtime. Give with 50 mg to equal total dose of 150 mg.   Yes Historical Provider, MD  QUEtiapine  (SEROQUEL) 25 MG tablet Take 50 mg by mouth at bedtime. Give at bedtime with 100 mg quetiapine to equal 150 mg.   Yes Historical Provider, MD  ranitidine (ZANTAC) 300 MG tablet Take 300 mg by mouth at bedtime.   Yes Historical Provider, MD  vitamin B-12 (CYANOCOBALAMIN) 1000 MCG tablet Take 1,000 mcg by mouth daily.   Yes Historical Provider, MD  docusate sodium (COLACE) 100 MG capsule Take 200 mg by mouth every morning.    Historical Provider, MD  food thickener (THICK IT) POWD Take 1 Container by mouth as needed. HONEY    Historical Provider, MD  LORazepam (ATIVAN) 0.5 MG tablet Take one tablet by mouth every 24 hours as needed for anxiety Patient not taking: Reported on 10/10/2016 09/03/16   Sharon Seller, NP    Physical Exam: Vitals:   10/10/16 1220 10/10/16 1414 10/10/16 1420 10/10/16  1441  BP: 116/58  (!) 144/101 136/56  Pulse: 92  87 88  Resp: 19  (!) 29 20  Temp: 100.8 F (38.2 C)   98.1 F (36.7 C)  TempSrc: Rectal   Oral  SpO2: 92%  95% 96%  Weight:  49.9 kg (110 lb)    Height:  5\' 2"  (1.575 m)       Constitutional: NAD, calm, comfortable, frail appearing Eyes: PERRL, lids and conjunctivae normal ENMT: Mucous membranes are dry. Posterior pharynx clear of any exudate or lesions. Edentulous  Neck: normal, supple, no masses, no thyromegaly Respiratory: bilateral crackles. No wheezing. Normal effort. No accessory muscle use.  Cardiovascular: Regular rate and rhythm, no murmurs / rubs / gallops. No extremity edema. 2+ pedal pulses. No carotid bruits.  Abdomen: no tenderness, no masses palpated. No hepatosplenomegaly. Bowel sounds positive.  Musculoskeletal: Significant tenderness of left proximal tibia. She has tenderness in her dorsal aspect of right foot as well. Decreased muscle mass. Unable to assess range of motion secondary to patient intolerance Skin: no rashes, lesions, ulcers. No induration Neurologic: CN 2-12 grossly intact. Sensation intact, DTR normal. Strength  4/5 in upper and LL extremities. Psychiatric: Normal judgment and insight. Alert and oriented x 2. Normal mood.    Labs on Admission: I have personally reviewed following labs and imaging studies  CBC:  Recent Labs Lab 10/10/16 1301  WBC 19.9*  NEUTROABS 17.9*  HGB 11.5*  HCT 34.9*  MCV 94.8  PLT 338   Basic Metabolic Panel:  Recent Labs Lab 10/10/16 1301  NA 149*  K 4.3  CL 111  CO2 27  GLUCOSE 147*  BUN 56*  CREATININE 1.41*  CALCIUM 9.6   GFR: Estimated Creatinine Clearance: 23 mL/min (by C-G formula based on SCr of 1.41 mg/dL (H)). Liver Function Tests:  Recent Labs Lab 10/10/16 1301  AST 19  ALT 11*  ALKPHOS 82  BILITOT 0.8  PROT 7.9  ALBUMIN 3.2*   No results for input(s): LIPASE, AMYLASE in the last 168 hours. No results for input(s): AMMONIA in the last 168 hours. Coagulation Profile: No results for input(s): INR, PROTIME in the last 168 hours. Cardiac Enzymes: No results for input(s): CKTOTAL, CKMB, CKMBINDEX, TROPONINI in the last 168 hours. BNP (last 3 results) No results for input(s): PROBNP in the last 8760 hours. HbA1C: No results for input(s): HGBA1C in the last 72 hours. CBG: No results for input(s): GLUCAP in the last 168 hours. Lipid Profile: No results for input(s): CHOL, HDL, LDLCALC, TRIG, CHOLHDL, LDLDIRECT in the last 72 hours. Thyroid Function Tests: No results for input(s): TSH, T4TOTAL, FREET4, T3FREE, THYROIDAB in the last 72 hours. Anemia Panel: No results for input(s): VITAMINB12, FOLATE, FERRITIN, TIBC, IRON, RETICCTPCT in the last 72 hours. Urine analysis:    Component Value Date/Time   COLORURINE AMBER (A) 10/10/2016 1405   APPEARANCEUR CLOUDY (A) 10/10/2016 1405   LABSPEC 1.025 10/10/2016 1405   PHURINE 8.0 10/10/2016 1405   GLUCOSEU NEGATIVE 10/10/2016 1405   HGBUR NEGATIVE 10/10/2016 1405   BILIRUBINUR NEGATIVE 10/10/2016 1405   KETONESUR NEGATIVE 10/10/2016 1405   PROTEINUR 100 (A) 10/10/2016 1405    NITRITE NEGATIVE 10/10/2016 1405   LEUKOCYTESUR LARGE (A) 10/10/2016 1405   Sepsis Labs: !!!!!!!!!!!!!!!!!!!!!!!!!!!!!!!!!!!!!!!!!!!! @LABRCNTIP (procalcitonin:4,lacticidven:4) )No results found for this or any previous visit (from the past 240 hour(s)).   Radiological Exams on Admission: Dg Chest 2 View  Result Date: 10/10/2016 CLINICAL DATA:  Cough, fever EXAM: CHEST  2 VIEW COMPARISON:  08/06/2015 FINDINGS:  Consolidation noted in the left lower lobe compatible with pneumonia. No confluent opacity on the right. Heart is normal size. No visible effusions or acute bony abnormality. Changes of prior multi level vertebroplasty in the mid to lower thoracic spine. IMPRESSION: Consolidation in the left lower lobe compatible with pneumonia. Followup PA and lateral chest X-ray is recommended in 3-4 weeks following trial of antibiotic therapy to ensure resolution and exclude underlying malignancy. Electronically Signed   By: Charlett Nose M.D.   On: 10/10/2016 13:33    EKG: Independently reviewed. Sinus rhythm.   Assessment/Plan Principal Problem:   HCAP (healthcare-associated pneumonia) Active Problems:   GERD (gastroesophageal reflux disease)   Essential hypertension   Hypothyroidism   Glaucoma   Depression, major, recurrent, severe with psychosis (HCC)   Paranoid schizophrenia, chronic condition (HCC)   Acute on chronic respiratory failure with hypoxia (HCC)   Healthcare associated pneumonia Patient coming from skilled nurse facility. Possible aspiration. -Continue vancomycin and cefepime -Urine strep pneumo antigen -Urine Legionella -Sputum Gram stain and culture  -NPO with sips until SLP evaluates  Acute on chronic respiratory failure with hypoxia Patient on oxygen for unknown etiology. Now with increased requirement likely secondary to pneumonia -wean O2 to baseline  Acute kidney injury Baseline of 0.7. Up to 1.41 today. Likely secondary to dehydration. S/p bolus in ED -repeat  BMP in AM  Right lower extremity pain Unknown reason for symptoms. She denies falling. -will obtain knee, tib-fib and ankle x-ray  Anxiety Depression -continue Klonopin -continue Celexa  Paranoid schizophrenia -continue Seroquel  Hypothyroidism -continue synthroid  Glaucoma Stable -continue eye drops  Essential hypertension Stable  GERD Stable -continue Pepcid   DVT prophylaxis: Lovenox Code Status: Full code Family Communication: None at bedside Disposition Plan: Anticipate discharge in 2-3 days Consults called: None Admission status: Inpatient   Jacquelin Hawking, MD [facility] Pager [phone]  If 7PM-7AM, please contact night-coverage [email or web address] Password [email or web address]  10/10/2016, 3:26 PM

## 2016-10-10 NOTE — ED Provider Notes (Signed)
[facility]-EMERGENCY DEPT Provider Note   CSN: [id number] Arrival date & time: 10/10/16  1154     History   Chief Complaint Chief Complaint  Patient presents with  . Cough    HPI Ashley Swanson is a 83 y.o. female with past medical history of schizophrenia, hypertension, dysphasia, chronic respiratory failure with hypoxia with 2L home O2, hypothyroidism who is sent to the ED by a nursing facility for fever and cough. Patient provided limited history due to underlying schizophrenia/dementia. Patient oriented to self and place only. Patient reported cough, shortness of breath, chest pain, burning with urination since yesterday. Unable to obtain any further history of present illness.  HPI  Past Medical History:  Diagnosis Date  . Cataract   . Depression   . Diverticulosis   . GERD (gastroesophageal reflux disease)   . Hyperlipidemia   . Hypertension   . Osteoporosis   . Stasis dermatitis   . Thyroid disease    hypothyroid    Patient Active Problem List   Diagnosis Date Noted  . Pneumonia 10/10/2016  . Loss of weight 08/10/2016  . Paranoid schizophrenia, chronic condition (HCC) 02/23/2016  . Chronic respiratory failure with hypoxia (HCC) 02/23/2016  . Dysphagia, pharyngoesophageal phase 11/21/2015  . Depression, major, recurrent, severe with psychosis (HCC) 10/14/2015  . Supplemental oxygen dependent 08/12/2015  . Chronic constipation 08/12/2015  . Glaucoma 08/12/2015  . Hypothyroidism 08/07/2015  . Thoracic compression fracture (HCC) 08/06/2015  . GERD (gastroesophageal reflux disease)   . Hypertension     Past Surgical History:  Procedure Laterality Date  . ABDOMINAL HYSTERECTOMY    . APPENDECTOMY    . BREAST SURGERY     biopsy  . CATARACT EXTRACTION    . CHOLECYSTECTOMY    . TONSILECTOMY/ADENOIDECTOMY WITH MYRINGOTOMY    . TUBAL LIGATION    . vertebrolplasty      OB History    No data available       Home Medications    Prior to Admission medications     Medication Sig Start Date End Date Taking? Authorizing Provider  acetaminophen (TYLENOL) 325 MG tablet Take 650 mg by mouth every 8 (eight) hours.   Yes Historical Provider, MD  aluminum-magnesium hydroxide-simethicone (MAALOX) 200-200-20 MG/5ML SUSP Take 15 mLs by mouth every 4 (four) hours as needed (reflux).   Yes Historical Provider, MD  benzocaine (ANBESOL) 10 % mucosal gel Use as directed 1 application in the mouth or throat every 8 (eight) hours as needed for mouth pain.    Yes Historical Provider, MD  benzonatate (TESSALON) 100 MG capsule Take 100 mg by mouth 3 (three) times daily as needed for cough.   Yes Historical Provider, MD  bisacodyl (DULCOLAX) 10 MG suppository Place 10 mg rectally as needed for moderate constipation.   Yes Historical Provider, MD  brimonidine (ALPHAGAN) 0.2 % ophthalmic solution Place 1 drop into the right eye 3 (three) times daily.   Yes Historical Provider, MD  calcium carbonate (OS-CAL - DOSED IN MG OF ELEMENTAL CALCIUM) 1250 (500 CA) MG tablet Take 1 tablet by mouth daily with breakfast.   Yes Historical Provider, MD  citalopram (CELEXA) 20 MG tablet Take 20 mg by mouth daily.    Yes Historical Provider, MD  clonazePAM (KLONOPIN) 0.5 MG tablet Take 0.25 mg by mouth daily as needed for anxiety. 10/01/16  Yes Historical Provider, MD  dorzolamide-timolol (COSOPT) 22.3-6.8 MG/ML ophthalmic solution Place 1 drop into both eyes 2 (two) times daily.   Yes Historical Provider, MD  guaiFENesin (MUCINEX) 600 MG 12 hr tablet Take 600 mg by mouth every 12 (twelve) hours as needed (congestion).   Yes Historical Provider, MD  hydrocortisone (ANUSOL-HC) 2.5 % rectal cream Place 1 application rectally 2 (two) times daily as needed (itching, hemorrhoids.).    Yes Historical Provider, MD  hydrOXYzine (ATARAX/VISTARIL) 25 MG tablet Take 25 mg by mouth 2 (two) times daily as needed for itching.    Yes Historical Provider, MD  Hypromellose (ARTIFICIAL TEARS) 0.4 % SOLN Apply 1 drop to  eye 4 (four) times daily.   Yes Historical Provider, MD  ipratropium-albuterol (DUONEB) 0.5-2.5 (3) MG/3ML SOLN Take 3 mLs by nebulization 3 (three) times daily. For PNA.   Yes Historical Provider, MD  levothyroxine (SYNTHROID, LEVOTHROID) 25 MCG tablet Take 12.5 mcg by mouth daily before breakfast.   Yes Historical Provider, MD  magnesium hydroxide (MILK OF MAGNESIA) 400 MG/5ML suspension Take 30 mLs by mouth. Every 72 hours as needed   Yes Historical Provider, MD  MELATONIN PO Take 6 mg by mouth at bedtime.   Yes Historical Provider, MD  Multiple Vitamin (MULTIVITAMIN WITH MINERALS) TABS tablet Take 1 tablet by mouth daily.   Yes Historical Provider, MD  Ostomy Supplies (SKIN PREP WIPES) MISC 1 application by Does not apply route daily. Apply topically to R and L heel every day shift for preventative. Ensure feet are floating with pillow.   Yes Historical Provider, MD  OXYGEN Inhale 2 L/min into the lungs as needed (low o2 levels).   Yes Historical Provider, MD  prednisoLONE acetate (PRED FORTE) 1 % ophthalmic suspension Place 1 drop into the right eye 2 (two) times daily.    Yes Historical Provider, MD  QUEtiapine (SEROQUEL) 100 MG tablet Take 100 mg by mouth at bedtime. Give with 50 mg to equal total dose of 150 mg.   Yes Historical Provider, MD  QUEtiapine (SEROQUEL) 25 MG tablet Take 50 mg by mouth at bedtime. Give at bedtime with 100 mg quetiapine to equal 150 mg.   Yes Historical Provider, MD  ranitidine (ZANTAC) 300 MG tablet Take 300 mg by mouth at bedtime.   Yes Historical Provider, MD  vitamin B-12 (CYANOCOBALAMIN) 1000 MCG tablet Take 1,000 mcg by mouth daily.   Yes Historical Provider, MD  docusate sodium (COLACE) 100 MG capsule Take 200 mg by mouth every morning.    Historical Provider, MD  food thickener (THICK IT) POWD Take 1 Container by mouth as needed. HONEY    Historical Provider, MD  LORazepam (ATIVAN) 0.5 MG tablet Take one tablet by mouth every 24 hours as needed for  anxiety Patient not taking: Reported on 10/10/2016 09/03/16   Sharon Seller, NP    Family History Family History  Problem Relation Age of Onset  . Aortic aneurysm Mother   . Cancer Father     mouth  . Diabetes Neg Hx   . Coronary artery disease Neg Hx     Social History Social History  Substance Use Topics  . Smoking status: Former Games developer  . Smokeless tobacco: Not on file  . Alcohol use Yes     Allergies   Ciprofloxacin; Effexor [venlafaxine]; Enablex [darifenacin hydrobromide er]; Macrobid [nitrofurantoin monohyd macro]; Metronidazole and related; Nsaids; Reclast [zoledronic acid]; and Sulfa antibiotics   Review of Systems Review of Systems  Constitutional: Positive for fever. Negative for appetite change and chills.  HENT: Negative for congestion and sore throat.   Eyes: Negative for visual disturbance.  Respiratory: Positive for cough. Negative  for choking, chest tightness and shortness of breath.   Cardiovascular: Positive for chest pain. Negative for palpitations and leg swelling.  Gastrointestinal: Negative for abdominal pain, constipation, diarrhea, nausea and vomiting.  Genitourinary: Positive for dysuria. Negative for difficulty urinating and hematuria.  Musculoskeletal: Positive for arthralgias (R ankle, R hip).  Skin: Negative for rash and wound.  Neurological: Negative for dizziness, seizures, syncope, weakness, light-headedness, numbness and headaches.  Hematological: Does not bruise/bleed easily.  Psychiatric/Behavioral: Negative.      Physical Exam Updated Vital Signs BP 136/56 (BP Location: Left Arm)   Pulse 88   Temp 98.1 F (36.7 C) (Oral)   Resp 20   Ht 5\' 2"  (1.575 m)   Wt 49.9 kg   SpO2 96%   BMI 20.12 kg/m   Physical Exam  Constitutional: She is oriented to person, place, and time. She appears well-developed and well-nourished. No distress.  Pt found asleep, easily arousable to verbal stimuli. Pt alert to self and place only. Pt  provides limited history. Somnolent.   HENT:  Head: Normocephalic and atraumatic.  Nose: Nose normal.  Mouth/Throat: Oropharynx is clear and moist. No oropharyngeal exudate.  Dry mucous membranes. No teeth.  Eyes: Conjunctivae and EOM are normal. Pupils are equal, round, and reactive to light.  Neck: Normal range of motion. Neck supple. No JVD present. No tracheal deviation present.  Cardiovascular: Normal rate, regular rhythm, normal heart sounds and intact distal pulses.   No murmur heard. Pulmonary/Chest: No respiratory distress. She has no wheezes. She has no rales. She exhibits no tenderness.  Pursed lip breathing. Diffuse decreased breath sounds posterior chest at lower lobe bases.   Abdominal: Soft. Bowel sounds are normal. She exhibits no distension and no mass. There is no rebound and no guarding.  Suprapubic tenderness  Musculoskeletal: Normal range of motion. She exhibits no deformity.  Pt reports R ankle pain and R hip pain with ROM and palpation.  Lymphadenopathy:    She has no cervical adenopathy.  Neurological: She is alert and oriented to person, place, and time. No sensory deficit.  Pt able to move all four extremities.   Skin: Skin is warm and dry. Capillary refill takes less than 2 seconds.  No rashes on anterior aspect of body, pt did not want me to roll her over to see posterior aspect of body or buttocks.  Psychiatric: She has a normal mood and affect. Her behavior is normal. Judgment and thought content normal.  Nursing note and vitals reviewed.    ED Treatments / Results  Labs (all labs ordered are listed, but only abnormal results are displayed) Labs Reviewed  COMPREHENSIVE METABOLIC PANEL - Abnormal; Notable for the following:       Result Value   Sodium 149 (*)    Glucose, Bld 147 (*)    BUN 56 (*)    Creatinine, Ser 1.41 (*)    Albumin 3.2 (*)    ALT 11 (*)    GFR calc non Af Amer 33 (*)    GFR calc Af Amer 38 (*)    All other components within  normal limits  CBC WITH DIFFERENTIAL/PLATELET - Abnormal; Notable for the following:    WBC 19.9 (*)    RBC 3.68 (*)    Hemoglobin 11.5 (*)    HCT 34.9 (*)    Neutro Abs 17.9 (*)    Monocytes Absolute 1.2 (*)    All other components within normal limits  URINALYSIS, ROUTINE W REFLEX MICROSCOPIC - Abnormal; Notable  for the following:    Color, Urine AMBER (*)    APPearance CLOUDY (*)    Protein, ur 100 (*)    Leukocytes, UA LARGE (*)    Bacteria, UA RARE (*)    Squamous Epithelial / LPF 0-5 (*)    Non Squamous Epithelial 0-5 (*)    All other components within normal limits  I-STAT CG4 LACTIC ACID, ED - Abnormal; Notable for the following:    Lactic Acid, Venous 2.35 (*)    All other components within normal limits  URINE CULTURE  CULTURE, BLOOD (ROUTINE X 2)  CULTURE, BLOOD (ROUTINE X 2)  BRAIN NATRIURETIC PEPTIDE  I-STAT TROPOININ, ED  I-STAT CG4 LACTIC ACID, ED    EKG  EKG Interpretation None       Radiology Dg Chest 2 View  Result Date: 10/10/2016 CLINICAL DATA:  Cough, fever EXAM: CHEST  2 VIEW COMPARISON:  08/06/2015 FINDINGS: Consolidation noted in the left lower lobe compatible with pneumonia. No confluent opacity on the right. Heart is normal size. No visible effusions or acute bony abnormality. Changes of prior multi level vertebroplasty in the mid to lower thoracic spine. IMPRESSION: Consolidation in the left lower lobe compatible with pneumonia. Followup PA and lateral chest X-ray is recommended in 3-4 weeks following trial of antibiotic therapy to ensure resolution and exclude underlying malignancy. Electronically Signed   By: Charlett Nose M.D.   On: 10/10/2016 13:33    Procedures Procedures (including critical care time)  Medications Ordered in ED Medications  vancomycin (VANCOCIN) IVPB 1000 mg/200 mL premix (1,000 mg Intravenous New Bag/Given 10/10/16 1438)  vancomycin (VANCOCIN) 500 mg in sodium chloride 0.9 % 100 mL IVPB (not administered)  ceFEPIme  (MAXIPIME) 1 g in dextrose 5 % 50 mL IVPB (not administered)  acetaminophen (TYLENOL) tablet 650 mg (650 mg Oral Given 10/10/16 1355)  sodium chloride 0.9 % bolus 1,000 mL (0 mLs Intravenous Stopped 10/10/16 1437)  ceFEPIme (MAXIPIME) 2 g in dextrose 5 % 50 mL IVPB (0 g Intravenous Stopped 10/10/16 1435)  0.9 %  sodium chloride infusion ( Intravenous New Bag/Given 10/10/16 1443)     Initial Impression / Assessment and Plan / ED Course  I have reviewed the triage vital signs and the nursing notes.  Pertinent labs & imaging results that were available during my care of the patient were reviewed by me and considered in my medical decision making (see chart for details).  Clinical Course as of Oct 10 1528  Wed Oct 10, 2016  1430 reviewed Lactic Acid, Venous: (!!) 2.35 [CG]  1431 reviewed WBC: (!) 19.9 [CG]  1431 Reviewed. Pneumonia, will start abx. Likely admission. DG Chest 2 View [CG]  1431 Reviewed. AKI. Pt getting fluids.  Creatinine: (!) 1.41 [CG]    Clinical Course User Index [CG] Liberty Handy, PA-C   Will get lab work and imaging started to rule out pneumonia, UTI, cardiac etiology to reported increased cough and chest pain with fever.    Left lower lobe consolidation likely pneumonia.  Tons of leukocytes and WBCs on U/A with dysuria.  Lactic acid at 2.35, leukocytosis at 19.9, AKI with creatinine at 1.41. Patient is febrile, requiring additional O2 from baseline with O2 sat at 95% on 3 L via nasal cannula. This is increased from baseline oxygen requirement. BNP pending.    EKG and troponin negative. Patient has received Tylenol, maintenance IVF and bolus, antibiotics in ED which cover PNA and UTI.  Will request admission.   Note -  pt did not allow me to roll her to examine her skin on posterior aspect of body or buttocks. Pt denied sores or pain. RN stated she did not note any sacral wounds during rectal temp.   Pt discussed with and evaluated by Dr. Corlis Leak who assisted with  hospitalist consult and admission.   Final Clinical Impressions(s) / ED Diagnoses   Final diagnoses:  Urinary tract infection without hematuria, site unspecified  HCAP (healthcare-associated pneumonia)    New Prescriptions New Prescriptions   No medications on file     Liberty Handy, PA-C 10/10/16 1530    Liberty Handy, PA-C 10/10/16 1612    Courteney Lyn Mackuen, MD 10/11/16 1112

## 2016-10-10 NOTE — Progress Notes (Signed)
Pharmacy Antibiotic Note  Ashley Swanson is a 83 y.o. female admitted on 10/10/2016 with pneumonia.  Pharmacy has been consulted for vancomycin and cefepime dosing. Patient presents from nursing home with reports of shortness of breath, cough, and fever. CXR LLL pneumonia  Today, 10/10/2016  Renal: SCr increased  WBC increased  LA elevated  Temp 100.8 in ED  Plan:  Vancomycin 1gm in ED then 500mg  IV q24h  Cefepime 2gm in ED then 1gm IV q24h  Follow culture results (influenza results if done)  Follow renal function, adjust doses and check vancomycin trough as indicated   Temp (24hrs), Avg:100.8 F (38.2 C), Min:100.8 F (38.2 C), Max:100.8 F (38.2 C)   Recent Labs Lab 10/10/16 1301 10/10/16 1307  WBC 19.9*  --   CREATININE 1.41*  --   LATICACIDVEN  --  2.35*    Estimated Creatinine Clearance: 23.1 mL/min (by C-G formula based on SCr of 1.41 mg/dL (H)).    Allergies  Allergen Reactions  . Ciprofloxacin     unknown  . Effexor [Venlafaxine]     Intolerance  . Enablex [Darifenacin Hydrobromide Er]     unknown  . Macrobid [employer][Nitrofurantoin Monohyd Macro]     unknown  . Metronidazole And Related     unknown  . Nsaids     unknown  . Reclast [Zoledronic Acid]     unknown  . Sulfa Antibiotics     unknown    Antimicrobials this admission: 1/10 cefepime >> 1/10 vancomycin >>  Dose adjustments this admission:  Microbiology results: 1/10 BCx: sent 1/10 UCx: ordered   Thank you for allowing pharmacy to be a part of this patient's care.  Juliette Alcideustin Zeigler, PharmD, BCPS.   Pager: [phone] 10/10/2016 2:07 PM

## 2016-10-11 ENCOUNTER — Encounter (HOSPITAL_COMMUNITY): Payer: Self-pay | Admitting: *Deleted

## 2016-10-11 DIAGNOSIS — L8990 Pressure ulcer of unspecified site, unspecified stage: Secondary | ICD-10-CM | POA: Insufficient documentation

## 2016-10-11 LAB — BASIC METABOLIC PANEL
Anion gap: 5 (ref 5–15)
BUN: 47 mg/dL — AB (ref 6–20)
CHLORIDE: 113 mmol/L — AB (ref 101–111)
CO2: 25 mmol/L (ref 22–32)
CREATININE: 0.83 mg/dL (ref 0.44–1.00)
Calcium: 8.3 mg/dL — ABNORMAL LOW (ref 8.9–10.3)
GFR calc Af Amer: >60 mL/min (ref >60)
GFR calc non Af Amer: >60 mL/min (ref >60)
GLUCOSE: 97 mg/dL (ref 65–99)
POTASSIUM: 3.5 mmol/L (ref 3.5–5.1)
SODIUM: 143 mmol/L (ref 135–145)

## 2016-10-11 LAB — CBC
HEMATOCRIT: 29.5 % — AB (ref 36.0–46.0)
HEMOGLOBIN: 9 g/dL — AB (ref 12.0–15.0)
MCH: 29.6 pg (ref 26.0–34.0)
MCHC: 30.5 g/dL (ref 30.0–36.0)
MCV: 97 fL (ref 78.0–100.0)
Platelets: 314 10*3/uL (ref 150–400)
RBC: 3.04 MIL/uL — ABNORMAL LOW (ref 3.87–5.11)
RDW: 14.6 % (ref 11.5–15.5)
WBC: 14.2 10*3/uL — ABNORMAL HIGH (ref 4.0–10.5)

## 2016-10-11 LAB — TROPONIN I
TROPONIN I: 0.09 ng/mL — AB (ref <0.03)
TROPONIN I: 0.1 ng/mL — AB (ref <0.03)

## 2016-10-11 LAB — STREP PNEUMONIAE URINARY ANTIGEN: Strep Pneumo Urinary Antigen: NEGATIVE

## 2016-10-11 LAB — HIV ANTIBODY (ROUTINE TESTING W REFLEX): HIV Screen 4th Generation wRfx: NONREACTIVE

## 2016-10-11 MED ORDER — ENOXAPARIN SODIUM 40 MG/0.4ML ~~LOC~~ SOLN
40.0000 mg | SUBCUTANEOUS | Status: DC
  Administered 2016-10-11: 40 mg via SUBCUTANEOUS
  Filled 2016-10-11: qty 0.4

## 2016-10-11 MED ORDER — MORPHINE SULFATE (PF) 2 MG/ML IV SOLN
2.0000 mg | INTRAVENOUS | Status: DC | PRN
  Administered 2016-10-11 (×2): 2 mg via INTRAVENOUS
  Filled 2016-10-11 (×2): qty 1

## 2016-10-11 NOTE — Evaluation (Signed)
Clinical/Bedside Swallow Evaluation Patient Details  Name: Ashley Swanson MRN: [id number] Date of Birth: Jul 23, 1929  Today's Date: 10/11/2016 Time: SLP Start Time (ACUTE ONLY): 1005 SLP Stop Time (ACUTE ONLY): 1030 SLP Time Calculation (min) (ACUTE ONLY): 25 min  Past Medical History:  Past Medical History:  Diagnosis Date  . Cataract   . Depression   . Diverticulosis   . GERD (gastroesophageal reflux disease)   . Hyperlipidemia   . Hypertension   . Osteoporosis   . Stasis dermatitis   . Thyroid disease    hypothyroid   Past Surgical History:  Past Surgical History:  Procedure Laterality Date  . ABDOMINAL HYSTERECTOMY    . APPENDECTOMY    . BREAST SURGERY     biopsy  . CATARACT EXTRACTION    . CHOLECYSTECTOMY    . TONSILECTOMY/ADENOIDECTOMY WITH MYRINGOTOMY    . TUBAL LIGATION    . vertebrolplasty     HPI:  83 year old female admitted 10/10/16 with fever and cough. PMH significant for HTN, hypothyroid, chronic respiratory failure, glaucoma, GERD   Assessment / Plan / Recommendation Clinical Impression  Suction was set up at bedside to facilitate oral care. Pt is edentulous, and reports her dentures are not here with her. She is used to eating a regular diet with them in place. Intermittent congested nonproductive cough noted prior to po trials. Oral motor evaluation reveals adequate strength and function, aside from edentulousness, which impairs ability to masticate solids. No overt s/s aspiration observed following any po trials. Pt reports no history of swallowing difficulty, and indicated tolerating a regular diet and thin liquids (with dentures in place during meals) prior to admit. Recommend puree diet (for energy conservation), and thin liquids via cup or straw, crushed meds. Safe swallow precautions [facility], including 1:1 supervision/assist with feeding, and upright position during and 30+ minutes after meals due to history of GERD. Given LLL consolidation on CXR   and few high risk indicators for dysphagia, there is a reduced likelihood of aspiration pneumonia, which would typically be seen in the RLL. However, ST will follow acutely for tolerance of diet and readiness to advance, as well as to determine need for objective study. RN aware    Aspiration Risk  Mild aspiration risk    Diet Recommendation Dysphagia 1 (Puree);Thin liquid   Liquid Administration via: Cup;Straw (small sips) Medication Administration: Crushed with puree Supervision: Full supervision/cueing for compensatory strategies Compensations: Minimize environmental distractions;Slow rate;Small sips/bites Postural Changes: Seated upright at 90 degrees;Remain upright for at least 30 minutes after po intake    Other  Recommendations Oral Care Recommendations: Oral care QID Other Recommendations: Have oral suction available   Follow up Recommendations 24 hour supervision/assistance      Frequency and Duration min 1 x/week  2 weeks       Prognosis Prognosis for Safe Diet Advancement: Fair      Swallow Study   General Date of Onset: 10/10/16 HPI: 83 year old female admitted 10/10/16 with fever and cough. PMH significant for HTN, hypothyroid, chronic respiratory failure, glaucoma, GERD Type of Study: Bedside Swallow Evaluation Previous Swallow Assessment: none found Diet Prior to this Study: NPO Respiratory Status: Nasal cannula History of Recent Intubation: No Behavior/Cognition: Alert;Cooperative;Pleasant mood Oral Cavity Assessment: Dry Oral Care Completed by SLP: Yes Oral Cavity - Dentition: Edentulous;Dentures, not available Vision: Functional for self-feeding Self-Feeding Abilities: Total assist Patient Positioning: Upright in bed Baseline Vocal Quality: Normal Volitional Cough: Strong Volitional Swallow: Able to elicit  Oral/Motor/Sensory Function Overall Oral Motor/Sensory Function: Mild impairment (adequate movement, but absent dentition)   Ice Chips Ice chips:  Within functional limits Presentation: Spoon   Thin Liquid Thin Liquid: Within functional limits Presentation: Cup;Straw    Nectar Thick Nectar Thick Liquid: Not tested   Honey Thick Honey Thick Liquid: Not tested   Puree Puree: Within functional limits Presentation: Spoon   Solid   GO   Solid: Impaired Oral Phase Impairments: Impaired mastication Oral Phase Functional Implications: Prolonged oral transit;Impaired mastication       Ashley Swanson B. Murvin Natal [facility], CCC-SLP [phone]  Leigh Aurora 10/11/2016,10:48 AM

## 2016-10-11 NOTE — Progress Notes (Addendum)
PROGRESS NOTE    Ashley Swanson  ZOX:[id number] DOB: 1929-06-14 DOA: 10/10/2016 PCP: Albertina SenegalPOLLOCK, [name], MD   Brief Narrative: Ashley Swanson is a 83 y.o. female with a history of hypertension, hypothyroidism, chronic respiratory failure, glaucoma, schizophrenia, dementia. She presented with increased oxygen requirement, productive cough and fever for, found to have left-sided pneumonia.   Assessment & Plan:   Principal Problem:   HCAP (healthcare-associated pneumonia) Active Problems:   GERD (gastroesophageal reflux disease)   Essential hypertension   Hypothyroidism   Glaucoma   Depression, major, recurrent, severe with psychosis (HCC)   Paranoid schizophrenia, chronic condition (HCC)   Acute on chronic respiratory failure with hypoxia (HCC)   Pressure injury of skin   Healthcare associated pneumonia Patient coming from skilled nurse facility. Possible aspiration. Urine strep pneumo antigen negative -Continue vancomycin and cefepime -Urine Legionella -Sputum Gram stain and culture  -Blood culture  Acute on chronic respiratory failure with hypoxia Patient on oxygen for unknown etiology. Now with increased requirement likely secondary to pneumonia -wean O2 to baseline  Chest pain Right sided. Not present on admission. Possibly secondary to position. On opposite side of pneumonia. Improved with morphine. -EKG -Troponin  Acute kidney injury Resolved today  Nondisplaced medial condyle fracture of the right knee She denies falling. Discussed with SNF and no history of injury. Patient is non-ambulatory. -Orthopedic surgery recommendations -morphine prn  Anxiety Depression -continue Klonopin -continue Celexa  Paranoid schizophrenia -continue Seroquel  Hypothyroidism -continue synthroid  Glaucoma Stable -continue eye drops  Essential hypertension Stable  GERD Stable -continue Pepcid   DVT prophylaxis: Lovenox Code Status: Full code  Family  Communication: None at bedside  Disposition Plan: Anticipate discharge to SNF in 1-2 days    Consultants:   Orthopedic surgery, Dr. Turner Danielsowan  Procedures:  None  Antimicrobials:  Vancomycin (1/10>>  Cefepime (1/10>>   Subjective: Patient reports having chest pain since last night. Chest pain is nonradiating and persistent. She reports she is unable to qualify but that there are no eliciting factors. Morphine has improved her symptoms. Otherwise, she reports improvement of her dyspnea.  ROS: No palpitations, no nausea, no vomiting  Objective: Vitals:   10/10/16 1441 10/10/16 1657 10/10/16 1914 10/11/16 0436  BP: 136/56 109/61 (!) 141/64 128/66  Pulse: 88 83 89 73  Resp: 20 23 20 18   Temp: 98.1 F (36.7 C)  98.1 F (36.7 C) 98.4 F (36.9 C)  TempSrc: Oral  Axillary Axillary  SpO2: 96% 96% 96% 97%  Weight:      Height:        Intake/Output Summary (Last 24 hours) at 10/11/16 1331 Last data filed at 10/11/16 1002  Gross per 24 hour  Intake             1050 ml  Output                0 ml  Net             1050 ml   Filed Weights   10/10/16 1414  Weight: 49.9 kg (110 lb)    Examination:  General exam: Appears calm and comfortable Respiratory system: Clear to auscultation. Respiratory effort normal. Cardiovascular system: S1 & S2 heard, RRR. No murmurs, rubs, gallops or clicks. Gastrointestinal system: Abdomen is nondistended, soft and nontender. No organomegaly or masses felt. Normal bowel sounds heard. Central nervous system: Alert and oriented. No focal neurological deficits. Extremities: No edema. No calf tenderness Skin: No cyanosis. No rashes Psychiatry: Judgement and insight appear normal. Mood &  affect appropriate.     Data Reviewed: I have personally reviewed following labs and imaging studies  CBC:  Recent Labs Lab 10/10/16 1301 10/10/16 2049 10/11/16 0409  WBC 19.9* 18.0* 14.2*  NEUTROABS 17.9*  --   --   HGB 11.5* 10.3* 9.0*  HCT 34.9*  33.0* 29.5*  MCV 94.8 97.6 97.0  PLT 338 336 314   Basic Metabolic Panel:  Recent Labs Lab 10/10/16 1301 10/10/16 2049 10/11/16 0409  NA 149*  --  143  K 4.3  --  3.5  CL 111  --  113*  CO2 27  --  25  GLUCOSE 147*  --  97  BUN 56*  --  47*  CREATININE 1.41* 1.07* 0.83  CALCIUM 9.6  --  8.3*   GFR: Estimated Creatinine Clearance: 39 mL/min (by C-G formula based on SCr of 0.83 mg/dL). Liver Function Tests:  Recent Labs Lab 10/10/16 1301  AST 19  ALT 11*  ALKPHOS 82  BILITOT 0.8  PROT 7.9  ALBUMIN 3.2*   No results for input(s): LIPASE, AMYLASE in the last 168 hours. No results for input(s): AMMONIA in the last 168 hours. Coagulation Profile: No results for input(s): INR, PROTIME in the last 168 hours. Cardiac Enzymes: No results for input(s): CKTOTAL, CKMB, CKMBINDEX, TROPONINI in the last 168 hours. BNP (last 3 results) No results for input(s): PROBNP in the last 8760 hours. HbA1C: No results for input(s): HGBA1C in the last 72 hours. CBG: No results for input(s): GLUCAP in the last 168 hours. Lipid Profile: No results for input(s): CHOL, HDL, LDLCALC, TRIG, CHOLHDL, LDLDIRECT in the last 72 hours. Thyroid Function Tests: No results for input(s): TSH, T4TOTAL, FREET4, T3FREE, THYROIDAB in the last 72 hours. Anemia Panel: No results for input(s): VITAMINB12, FOLATE, FERRITIN, TIBC, IRON, RETICCTPCT in the last 72 hours. Sepsis Labs:  Recent Labs Lab 10/10/16 1307 10/10/16 1550 10/10/16 2049  LATICACIDVEN 2.35* 2.27* 1.8    Recent Results (from the past 240 hour(s))  Urine culture     Status: Abnormal (Preliminary result)   Collection Time: 10/10/16  2:05 PM  Result Value Ref Range Status   Specimen Description URINE, RANDOM  Final   Special Requests NONE  Final   Culture >=100,000 COLONIES/mL GRAM NEGATIVE RODS (A)  Final   Report Status PENDING  Incomplete  MRSA PCR Screening     Status: Abnormal   Collection Time: 10/10/16  8:07 PM  Result  Value Ref Range Status   MRSA by PCR POSITIVE (A) NEGATIVE Final    Comment:        The GeneXpert MRSA Assay (FDA approved for NASAL specimens only), is one component of a comprehensive MRSA colonization surveillance program. It is not intended to diagnose MRSA infection nor to guide or monitor treatment for MRSA infections. RESULT CALLED TO, READ BACK BY AND VERIFIED WITH: Karma Greaser [id number] @ 2220 BY J SCOTTON          Radiology Studies: Dg Chest 2 View  Result Date: 10/10/2016 CLINICAL DATA:  Cough, fever EXAM: CHEST  2 VIEW COMPARISON:  08/06/2015 FINDINGS: Consolidation noted in the left lower lobe compatible with pneumonia. No confluent opacity on the right. Heart is normal size. No visible effusions or acute bony abnormality. Changes of prior multi level vertebroplasty in the mid to lower thoracic spine. IMPRESSION: Consolidation in the left lower lobe compatible with pneumonia. Followup PA and lateral chest X-ray is recommended in 3-4 weeks following trial of antibiotic therapy to ensure  resolution and exclude underlying malignancy. Electronically Signed   By: Charlett Nose M.D.   On: 10/10/2016 13:33   Dg Tibia/fibula Right  Result Date: 10/10/2016 CLINICAL DATA:  Onset right lower leg pain today. No known injury. Initial encounter. EXAM: RIGHT TIBIA AND FIBULA - 2 VIEW COMPARISON:  None. FINDINGS: There is no evidence of fracture or other focal bone lesions. Bones are osteopenic. Soft tissues are unremarkable. IMPRESSION: No acute abnormality. Osteopenia. Electronically Signed   By: Drusilla Kanner M.D.   On: 10/10/2016 18:00   Dg Ankle Complete Right  Result Date: 10/10/2016 CLINICAL DATA:  Onset right ankle pain today. No known injury. Initial encounter. EXAM: RIGHT ANKLE - COMPLETE 3+ VIEW COMPARISON:  None. FINDINGS: No acute bony or joint abnormality is seen. Bones are osteopenic. No notable degenerative disease. Soft tissues are unremarkable. IMPRESSION: Osteopenia.   No acute abnormality. Electronically Signed   By: Drusilla Kanner M.D.   On: 10/10/2016 18:00   Dg Knee Complete 4 Views Right  Result Date: 10/10/2016 CLINICAL DATA:  Onset right knee pain today. No known injury. Initial encounter. EXAM: RIGHT KNEE - COMPLETE 4+ VIEW COMPARISON:  None. FINDINGS: There is a nondisplaced fracture through the medial epicondyle of the femur. No other fracture is identified. Bones are markedly osteopenic. Small joint effusion noted. IMPRESSION: Acute nondisplaced fracture medial epicondyle of the right femur appears incomplete. Osteopenia. Small effusion. Electronically Signed   By: Drusilla Kanner M.D.   On: 10/10/2016 18:02   Dg Foot Complete Right  Result Date: 10/10/2016 CLINICAL DATA:  Onset right foot pain today. No known injury. Initial encounter. EXAM: RIGHT FOOT COMPLETE - 3+ VIEW COMPARISON:  None. FINDINGS: No acute bony or joint abnormality is identified. No notable arthropathy. Bones are markedly osteopenic. IMPRESSION: No acute abnormality. Osteopenia. Electronically Signed   By: Drusilla Kanner M.D.   On: 10/10/2016 18:02        Scheduled Meds: . acetaminophen  650 mg Oral Q8H  . brimonidine  1 drop Right Eye TID  . calcium carbonate  1 tablet Oral Q breakfast  . ceFEPime (MAXIPIME) IV  1 g Intravenous Q24H  . Chlorhexidine Gluconate Cloth  6 each Topical Q0600  . citalopram  20 mg Oral Daily  . docusate sodium  200 mg Oral q morning - 10a  . dorzolamide-timolol  1 drop Both Eyes BID  . enoxaparin (LOVENOX) injection  40 mg Subcutaneous Q24H  . famotidine  40 mg Oral QHS  . levothyroxine  12.5 mcg Oral QAC breakfast  . mupirocin ointment  1 application Nasal BID  . polyvinyl alcohol  1 drop Both Eyes QID  . prednisoLONE acetate  1 drop Right Eye BID  . QUEtiapine  150 mg Oral QHS  . vancomycin  500 mg Intravenous Q24H  . vitamin B-12  1,000 mcg Oral Daily   Continuous Infusions: . sodium chloride 100 mL/hr at 10/11/16 1000     LOS:  1 day     Jacquelin Hawking [facility] 10/11/2016, 1:31 PM Pager: [phone]  If 7PM-7AM, please contact night-coverage [email or web address] Password [email or web address] 10/11/2016, 1:31 PM

## 2016-10-11 NOTE — Progress Notes (Signed)
Result for elevated troponin. EKG significant for no ST elevation. Chest pain resolved. Will trend troponin but low suspicion for ACS.

## 2016-10-11 NOTE — Consult Note (Signed)
Reason for Consult: Nondisplaced right distal femur medial condyle fracture Referring Physician: Dr. Shirlee Limerick is an 83 y.o. female.  HPI: 83 year old female transferred from [place] for productive cough and dyspnea 10/11/15 and admitted for treatment of pneumonia. She does require increased supplemental oxygen. She is moderately confused and is not able to provide an accurate history. On her admission H&P she was noted to have some tenderness along the medial aspect of the right knee x-rays were obtained showing a nondisplaced fracture of the medial condyle of the distal femur at the junction of the diaphysis and metaphyseal flare on the AP only. Patient does recall any specific injury. She ambulates minimally. Other comorbidities include hypertension hypothyroidism chronic respiratory failure glaucoma osteoporosis with thoracic insufficiency fractures of the vertebral bodies last year. Because of her bone quality she was not felt to be a candidate for vertebroplasty. She is presently maintained on 4 L of supplemental oxygen via nasal cannula. At rest she has little any pain in her right knee if she attempts to move it or stress it it causes significant discomfort.  Past Medical History:  Diagnosis Date  . Cataract   . Depression   . Diverticulosis   . GERD (gastroesophageal reflux disease)   . Hyperlipidemia   . Hypertension   . Osteoporosis   . Stasis dermatitis   . Thyroid disease    hypothyroid    Past Surgical History:  Procedure Laterality Date  . ABDOMINAL HYSTERECTOMY    . APPENDECTOMY    . BREAST SURGERY     biopsy  . CATARACT EXTRACTION    . CHOLECYSTECTOMY    . TONSILECTOMY/ADENOIDECTOMY WITH MYRINGOTOMY    . TUBAL LIGATION    . vertebrolplasty      Family History  Problem Relation Age of Onset  . Aortic aneurysm Mother   . Cancer Father     mouth  . Diabetes Neg Hx   . Coronary artery disease Neg Hx     Social  History:  reports that she has quit smoking. She does not have any smokeless tobacco history on file. She reports that she drinks alcohol. Her drug history is not on file.  Allergies:  Allergies  Allergen Reactions  . Ciprofloxacin     unknown  . Effexor [Venlafaxine]     Intolerance  . Enablex [Darifenacin Hydrobromide Er]     unknown  . Macrobid [employer] Macro]     unknown  . Metronidazole And Related     unknown  . Nsaids     unknown  . Reclast [Zoledronic Acid]     unknown  . Sulfa Antibiotics     unknown    Medications: I have reviewed the patient's current medications.  Results for orders placed or performed during the hospital encounter of 10/10/16 (from the past 48 hour(s))  Comprehensive metabolic panel     Status: Abnormal   Collection Time: 10/10/16  1:01 PM  Result Value Ref Range   Sodium 149 (H) 135 - 145 mmol/L   Potassium 4.3 3.5 - 5.1 mmol/L   Chloride 111 101 - 111 mmol/L   CO2 27 22 - 32 mmol/L   Glucose, Bld 147 (H) 65 - 99 mg/dL   BUN 56 (H) 6 - 20 mg/dL   Creatinine, Ser 1.41 (H) 0.44 - 1.00 mg/dL   Calcium 9.6 8.9 - 10.3 mg/dL   Total Protein 7.9 6.5 - 8.1 g/dL   Albumin 3.2 (L) 3.5 -  5.0 g/dL   AST 19 15 - 41 U/L   ALT 11 (L) 14 - 54 U/L   Alkaline Phosphatase 82 38 - 126 U/L   Total Bilirubin 0.8 0.3 - 1.2 mg/dL   GFR calc non Af Amer 33 (L) >60 mL/min   GFR calc Af Amer 38 (L) >60 mL/min    Comment: (NOTE) The eGFR has been calculated using the CKD EPI equation. This calculation has not been validated in all clinical situations. eGFR's persistently <60 mL/min signify possible Chronic Kidney Disease.    Anion gap 11 5 - 15  CBC with Differential/Platelet     Status: Abnormal   Collection Time: 10/10/16  1:01 PM  Result Value Ref Range   WBC 19.9 (H) 4.0 - 10.5 K/uL   RBC 3.68 (L) 3.87 - 5.11 MIL/uL   Hemoglobin 11.5 (L) 12.0 - 15.0 g/dL   HCT 34.9 (L) 36.0 - 46.0 %   MCV 94.8 78.0 - 100.0 fL   MCH 31.3 26.0 - 34.0 pg    MCHC 33.0 30.0 - 36.0 g/dL   RDW 14.3 11.5 - 15.5 %   Platelets 338 150 - 400 K/uL   Neutrophils Relative % 90 %   Lymphocytes Relative 4 %   Monocytes Relative 6 %   Eosinophils Relative 0 %   Basophils Relative 0 %   Neutro Abs 17.9 (H) 1.7 - 7.7 K/uL   Lymphs Abs 0.8 0.7 - 4.0 K/uL   Monocytes Absolute 1.2 (H) 0.1 - 1.0 K/uL   Eosinophils Absolute 0.0 0.0 - 0.7 K/uL   Basophils Absolute 0.0 0.0 - 0.1 K/uL   WBC Morphology INCREASED BANDS (>20% BANDS)   I-Stat CG4 Lactic Acid, ED     Status: Abnormal   Collection Time: 10/10/16  1:07 PM  Result Value Ref Range   Lactic Acid, Venous 2.35 (HH) 0.5 - 1.9 mmol/L   Comment NOTIFIED PHYSICIAN   I-Stat Troponin, ED (not at [facility])     Status: None   Collection Time: 10/10/16  1:10 PM  Result Value Ref Range   Troponin i, poc 0.04 0.00 - 0.08 ng/mL   Comment 3            Comment: Due to the release kinetics of cTnI, a negative result within the first hours of the onset of symptoms does not rule out myocardial infarction with certainty. If myocardial infarction is still suspected, repeat the test at appropriate intervals.   Urinalysis, Routine w reflex microscopic     Status: Abnormal   Collection Time: 10/10/16  2:05 PM  Result Value Ref Range   Color, Urine AMBER (A) YELLOW    Comment: BIOCHEMICALS MAY BE AFFECTED BY COLOR   APPearance CLOUDY (A) CLEAR   Specific Gravity, Urine 1.025 1.005 - 1.030   pH 8.0 5.0 - 8.0   Glucose, UA NEGATIVE NEGATIVE mg/dL   Hgb urine dipstick NEGATIVE NEGATIVE   Bilirubin Urine NEGATIVE NEGATIVE   Ketones, ur NEGATIVE NEGATIVE mg/dL   Protein, ur 100 (A) NEGATIVE mg/dL   Nitrite NEGATIVE NEGATIVE   Leukocytes, UA LARGE (A) NEGATIVE   RBC / HPF 0-5 0 - 5 RBC/hpf   WBC, UA TOO NUMEROUS TO COUNT 0 - 5 WBC/hpf   Bacteria, UA RARE (A) NONE SEEN   Squamous Epithelial / LPF 0-5 (A) NONE SEEN   Mucous PRESENT    Hyaline Casts, UA PRESENT    Non Squamous Epithelial 0-5 (A) NONE SEEN  Strep  pneumoniae urinary antigen  Status: None   Collection Time: 10/10/16  2:05 PM  Result Value Ref Range   Strep Pneumo Urinary Antigen NEGATIVE NEGATIVE    Comment:        Infection due to S. pneumoniae cannot be absolutely ruled out since the antigen present may be below the detection limit of the test. Performed at [place] Lactic Acid, ED  (not at  [facility])     Status: Abnormal   Collection Time: 10/10/16  3:50 PM  Result Value Ref Range   Lactic Acid, Venous 2.27 (HH) 0.5 - 1.9 mmol/L   Comment NOTIFIED PHYSICIAN   MRSA PCR Screening     Status: Abnormal   Collection Time: 10/10/16  8:07 PM  Result Value Ref Range   MRSA by PCR POSITIVE (A) NEGATIVE    Comment:        The GeneXpert MRSA Assay (FDA approved for NASAL specimens only), is one component of a comprehensive MRSA colonization surveillance program. It is not intended to diagnose MRSA infection nor to guide or monitor treatment for MRSA infections. RESULT CALLED TO, READ BACK BY AND VERIFIED WITH: Rex Kras [id number] @ 2220 BY J SCOTTON   Lactic acid, plasma     Status: None   Collection Time: 10/10/16  8:49 PM  Result Value Ref Range   Lactic Acid, Venous 1.8 0.5 - 1.9 mmol/L  CBC     Status: Abnormal   Collection Time: 10/10/16  8:49 PM  Result Value Ref Range   WBC 18.0 (H) 4.0 - 10.5 K/uL   RBC 3.38 (L) 3.87 - 5.11 MIL/uL   Hemoglobin 10.3 (L) 12.0 - 15.0 g/dL   HCT 33.0 (L) 36.0 - 46.0 %   MCV 97.6 78.0 - 100.0 fL   MCH 30.5 26.0 - 34.0 pg   MCHC 31.2 30.0 - 36.0 g/dL   RDW 14.6 11.5 - 15.5 %   Platelets 336 150 - 400 K/uL  Creatinine, serum     Status: Abnormal   Collection Time: 10/10/16  8:49 PM  Result Value Ref Range   Creatinine, Ser 1.07 (H) 0.44 - 1.00 mg/dL   GFR calc non Af Amer 46 (L) >60 mL/min   GFR calc Af Amer 53 (L) >60 mL/min    Comment: (NOTE) The eGFR has been calculated using the CKD EPI equation. This calculation has not been validated in all  clinical situations. eGFR's persistently <60 mL/min signify possible Chronic Kidney Disease.   Basic metabolic panel     Status: Abnormal   Collection Time: 10/11/16  4:09 AM  Result Value Ref Range   Sodium 143 135 - 145 mmol/L   Potassium 3.5 3.5 - 5.1 mmol/L    Comment: DELTA CHECK NOTED   Chloride 113 (H) 101 - 111 mmol/L   CO2 25 22 - 32 mmol/L   Glucose, Bld 97 65 - 99 mg/dL   BUN 47 (H) 6 - 20 mg/dL   Creatinine, Ser 0.83 0.44 - 1.00 mg/dL   Calcium 8.3 (L) 8.9 - 10.3 mg/dL   GFR calc non Af Amer >60 >60 mL/min   GFR calc Af Amer >60 >60 mL/min    Comment: (NOTE) The eGFR has been calculated using the CKD EPI equation. This calculation has not been validated in all clinical situations. eGFR's persistently <60 mL/min signify possible Chronic Kidney Disease.    Anion gap 5 5 - 15  CBC     Status: Abnormal   Collection Time: 10/11/16  4:09 AM  Result Value Ref Range   WBC 14.2 (H) 4.0 - 10.5 K/uL   RBC 3.04 (L) 3.87 - 5.11 MIL/uL   Hemoglobin 9.0 (L) 12.0 - 15.0 g/dL   HCT 29.5 (L) 36.0 - 46.0 %   MCV 97.0 78.0 - 100.0 fL   MCH 29.6 26.0 - 34.0 pg   MCHC 30.5 30.0 - 36.0 g/dL   RDW 14.6 11.5 - 15.5 %   Platelets 314 150 - 400 K/uL    Dg Chest 2 View  Result Date: 10/10/2016 CLINICAL DATA:  Cough, fever EXAM: CHEST  2 VIEW COMPARISON:  08/06/2015 FINDINGS: Consolidation noted in the left lower lobe compatible with pneumonia. No confluent opacity on the right. Heart is normal size. No visible effusions or acute bony abnormality. Changes of prior multi level vertebroplasty in the mid to lower thoracic spine. IMPRESSION: Consolidation in the left lower lobe compatible with pneumonia. Followup PA and lateral chest X-ray is recommended in 3-4 weeks following trial of antibiotic therapy to ensure resolution and exclude underlying malignancy. Electronically Signed   By: Rolm Baptise M.D.   On: 10/10/2016 13:33   Dg Tibia/fibula Right  Result Date: 10/10/2016 CLINICAL DATA:   Onset right lower leg pain today. No known injury. Initial encounter. EXAM: RIGHT TIBIA AND FIBULA - 2 VIEW COMPARISON:  None. FINDINGS: There is no evidence of fracture or other focal bone lesions. Bones are osteopenic. Soft tissues are unremarkable. IMPRESSION: No acute abnormality. Osteopenia. Electronically Signed   By: Inge Rise M.D.   On: 10/10/2016 18:00   Dg Ankle Complete Right  Result Date: 10/10/2016 CLINICAL DATA:  Onset right ankle pain today. No known injury. Initial encounter. EXAM: RIGHT ANKLE - COMPLETE 3+ VIEW COMPARISON:  None. FINDINGS: No acute bony or joint abnormality is seen. Bones are osteopenic. No notable degenerative disease. Soft tissues are unremarkable. IMPRESSION: Osteopenia.  No acute abnormality. Electronically Signed   By: Inge Rise M.D.   On: 10/10/2016 18:00   Dg Knee Complete 4 Views Right  Result Date: 10/10/2016 CLINICAL DATA:  Onset right knee pain today. No known injury. Initial encounter. EXAM: RIGHT KNEE - COMPLETE 4+ VIEW COMPARISON:  None. FINDINGS: There is a nondisplaced fracture through the medial epicondyle of the femur. No other fracture is identified. Bones are markedly osteopenic. Small joint effusion noted. IMPRESSION: Acute nondisplaced fracture medial epicondyle of the right femur appears incomplete. Osteopenia. Small effusion. Electronically Signed   By: Inge Rise M.D.   On: 10/10/2016 18:02   Dg Foot Complete Right  Result Date: 10/10/2016 CLINICAL DATA:  Onset right foot pain today. No known injury. Initial encounter. EXAM: RIGHT FOOT COMPLETE - 3+ VIEW COMPARISON:  None. FINDINGS: No acute bony or joint abnormality is identified. No notable arthropathy. Bones are markedly osteopenic. IMPRESSION: No acute abnormality. Osteopenia. Electronically Signed   By: Inge Rise M.D.   On: 10/10/2016 18:02    ROS Blood pressure 128/66, pulse 73, temperature 98.4 F (36.9 C), temperature source Axillary, resp. rate 18, height  5' 2"  (1.575 m), weight 49.9 kg (110 lb), SpO2 97 %. Physical Exam: The left knee range of motion is 0/120 no effusion no discomfort. The right knee is held at 45 flexion and with gentle effort can extend to within 10 of full extension flexion is to 90 with some discomfort. There is no palpable effusion. The skin is intact. She is tender along the medial condylar region of the right knee. She does have trophic changes  to the skin over both tibias and ankles. Her x-rays reviewed on the [facility] PACS system.  Assessment/Plan: Assessment: Nondisplaced medial condyle fracture of the right knee ulnarly visible on the AP view and an 83 year old woman who is an extremely poor candidate for any surgical intervention. Plan: We'll order short knee immobilizer, she should follow-up in our office in 3-4 weeks. Because she is a nonambulator and is in poor medical condition she would not tolerate any surgical intervention.  Frederik Pear J 10/11/2016, 12:03 PM

## 2016-10-12 LAB — URINE CULTURE: Culture: >=100000 — AB

## 2016-10-12 LAB — TROPONIN I
TROPONIN I: 0.1 ng/mL — AB (ref <0.03)
TROPONIN I: 0.08 ng/mL — AB (ref <0.03)

## 2016-10-12 MED ORDER — DOXYCYCLINE HYCLATE 100 MG PO CAPS: 100.0000 mg | ORAL_CAPSULE | Freq: Two times a day (BID) | ORAL | Status: AC

## 2016-10-12 MED ORDER — ACETAMINOPHEN 500 MG PO TABS: 500.0000 mg | ORAL_TABLET | Freq: Three times a day (TID) | ORAL | Status: DC | PRN

## 2016-10-12 MED ORDER — LEVOTHYROXINE SODIUM 25 MCG PO TABS
12.5000 ug | ORAL_TABLET | Freq: Every day | ORAL | Status: DC
  Administered 2016-10-12: 12.5 ug via ORAL
  Filled 2016-10-12: qty 0.5

## 2016-10-12 MED ORDER — AMOXICILLIN-POT CLAVULANATE 875-125 MG PO TABS: 1.0000 | ORAL_TABLET | Freq: Two times a day (BID) | ORAL | Status: AC

## 2016-10-12 MED ORDER — RESOURCE THICKENUP CLEAR PO POWD
ORAL | Status: DC | PRN
  Filled 2016-10-12: qty 125

## 2016-10-12 NOTE — Progress Notes (Signed)
CSW consulted to assist with d/c planning. Pt is ready to return [facility] today. Pt is in agreement with this plan. [facility]. " I don't like being in the hospital. I want to go back. " Pt's daughter, Ms. Charm BargesButler, contacted and is in agreement with d/c plan. SNF contacted and is able to readmit today. CSW will assist with d/c planning back to SNF.  Cori RazorJamie Blaike Newburn LCSW [phone]

## 2016-10-12 NOTE — Progress Notes (Addendum)
[facility] is ready to [place] [facility]. Pt / daughter are in agreement with plan. [facility] transport needed. Medical necessity form completed. No Scripts printed. # for report provided to nsg.  Cori RazorJamie Olando Willems LCSW [phone]

## 2016-10-12 NOTE — Progress Notes (Signed)
Dc to [facility] by [facility].

## 2016-10-12 NOTE — Progress Notes (Signed)
Speech Language Pathology Treatment: Dysphagia  Patient Details Name: Ashley Swanson MRN: [id number] DOB: [birth year] Today's Date: 10/12/2016 Time: [id number] SLP Time Calculation (min) (ACUTE ONLY): 28 min  Assessment / Plan / Recommendation Clinical Impression  Pt awakened by SLP for am meal, states she is unnaturally sleepy, thinks she was given morphine last night for pain, but does want to eat breakfast. Pt is very kind, aware of needs and grateful for assistance. After sitting pt upright she says "You have me positioned beautifully." She demonstrates immediate coughing and wet vocal quality with thin liquids and states, "it moves too fast for me." SLP thickened liquids to nectar with much improved tolerance and pt thanked SLP for help. Pt tolerated the rest of meal, alternating puree and nectar, though prolonged oral phase and multiple swallowing observed. Recommend pt continue current diet, though aspiration risk is still quite high; suspect a combination of pharyngeal and esophageal dysphagia. Doubt that pt would tolerate MBS given weakness and fatigue. Hopeful that diet modification will reduce severity of aspiration. Will continue to follow for tolerance.    HPI HPI: 83 year old female admitted 10/10/16 with fever and cough. PMH significant for HTN, hypothyroid, chronic respiratory failure, glaucoma, GERD      SLP Plan  Continue with current plan of care     Recommendations  Diet recommendations: Dysphagia 1 (puree);Nectar-thick liquid Liquids provided via: Cup;Straw Medication Administration: Crushed with puree Supervision: Staff to assist with self feeding;Full supervision/cueing for compensatory strategies Compensations: Slow rate;Small sips/bites;Follow solids with liquid Postural Changes and/or Swallow Maneuvers: Seated upright 90 degrees;Upright 30-60 min after meal                Plan: Continue with current plan of care       GO                Ashley Swanson, Ashley Swanson  Ashley Swanson 10/12/2016, 9:56 AM

## 2016-10-12 NOTE — Progress Notes (Signed)
1611. When pt found out that she was being discharged she accused the staff of treating her unsafe. She stated that she  was gong to her home to take care of 2 boys that are 326 and 81 years old.That their  father had deserted them and she took them in and raised them.She cried and stated how much she loved these boys and she wanted to know that they were allright.She cussed at the staff and said that we were lying to her. I tried to re orieint her but she accused me of llying to her. I gave here Clonasepam 0.25 mg. 1730 She is more cooperative

## 2016-10-12 NOTE — Discharge Summary (Addendum)
Physician Discharge Summary  Ashley Swanson ZOX:[id number] DOB: 1928/12/23 DOA: 10/10/2016  PCP: [name], MD  Admit date: 10/10/2016 Discharge date: 10/12/2016  Admitted From: [facility] Disposition:  [facility]  Recommendations for Outpatient Follow-up:  1. Follow up with PCP in 1-2 weeks 2. Repeat chest x-ray in 3-4 weeks 3. Orthopedic follow-up for femur fracture 4. Continue pneumonia treatment 5. Continue UTI treatment 6. Follow-up labs: urine legionella, blood cultures 7. Leg immobilizer for 6-8 weeks   Discharge Condition: Stable CODE STATUS: Full code Diet recommendation: Dysphagia 1 diet, nectar-thick liquis  Brief/Interim Summary: Ashley Swanson is a 83 y.o. female with a history of hypertension, hypothyroidism, chronic respiratory failure, glaucoma, schizophrenia, dementia. She presented with increased oxygen requirement, productive cough and fever for, found to have left-sided pneumonia.   Hospital course:  HPI: Ashley Swanson is a 83 y.o. female with medical history significant of hypertension, hypothyroidism, chronic respiratory failure, glaucoma. Patient states she started having some productive coughing with associated increased dyspnea. She is unsure when her symptoms started. Her oxygen was increased from 2L to 4L which helped with dyspnea. No associated chest pain, nausea or vomiting. No palpitations.   Healthcare associated pneumonia Patient coming from skilled nurse facility. Possible aspiration. Urine strep pneumo antigen negative. Urine legionella pending. Treated with vancomycin and cefepime before transition to Augmentin and doxycycline at discharge.  Acute on chronic respiratory failure with hypoxia Patient on oxygen for unknown etiology. Increased requirement likely secondary to pneumonia. With treatment of pneumonia, able to wean back to chronic O2 of 2L.  UTI, proteus  mirabilis Found on culture. Will treat with above regimen of Augmentin.  Chest pain Right sided. Not present on admission. Possibly secondary to position. On opposite side of pneumonia. Improved with morphine. Troponin elevated but trended down. EKG not suggestive of ACS. Reproducible.   Acute kidney injury Resolved today  Nondisplaced medial condyle fracture of the right knee She denies falling. Discussed with SNF and no history of injury. Patient is non-ambulatory. Orthopedic surgery evaluated and have placed her in a leg immobilizer. Recommendations below:  Knee immobilizer for the next 6-8 weeks. Follow-up in 2 weeks with orthopedic surgery for follow-up x-ray. Immobilizer may be removed for cleansing and bathing.  Anxiety Depression Continue Klonopin and Celexa  Paranoid schizophrenia Continue Seroquel  Hypothyroidism Continue synthroid  Glaucoma Stable. Continue eye drops  Essential hypertension Stable  GERD Stable. Pepcid  Sacral ulcer, stage 1 Present on admission. Wound care.   Discharge Diagnoses:  Principal Problem:   HCAP (healthcare-associated pneumonia) Active Problems:   GERD (gastroesophageal reflux disease)   Essential hypertension   Hypothyroidism   Glaucoma   Depression, major, recurrent, severe with psychosis (HCC)   Paranoid schizophrenia, chronic condition (HCC)   Acute on chronic respiratory failure with hypoxia (HCC)   Pressure injury of skin    Discharge Instructions   Allergies as of 10/12/2016      Reactions   Ciprofloxacin    unknown   Effexor [venlafaxine]    Intolerance   Enablex [darifenacin Hydrobromide Er]    unknown   Macrobid [nitrofurantoin Monohyd Macro]    unknown   Metronidazole And Related    unknown   Nsaids    unknown   Reclast [zoledronic Acid]    unknown   Sulfa Antibiotics    unknown      Medication List    STOP taking these medications   hydrOXYzine 25 MG tablet Commonly known as:  ATARAX/VISTARIL   LORazepam 0.5 MG tablet Commonly known as:  ATIVAN     TAKE these medications   acetaminophen 325 MG tablet Commonly known as:  TYLENOL Take 650 mg by mouth every 8 (eight) hours. What changed:  Another medication with the same name was added. Make sure you understand how and when to take each.   acetaminophen 500 MG tablet Commonly known as:  TYLENOL Take 1-2 tablets (500-1,000 mg total) by mouth every 8 (eight) hours as needed for mild pain or moderate pain. What changed:  You were already taking a medication with the same name, and this prescription was added. Make sure you understand how and when to take each.   aluminum-magnesium hydroxide-simethicone 200-200-20 MG/5ML Susp Commonly known as:  MAALOX Take 15 mLs by mouth every 4 (four) hours as needed (reflux).   amoxicillin-clavulanate 875-125 MG tablet Commonly known as:  AUGMENTIN Take 1 tablet by mouth 2 (two) times daily.   ANBESOL 10 % mucosal gel Generic drug:  benzocaine Use as directed 1 application in the mouth or throat every 8 (eight) hours as needed for mouth pain.   ARTIFICIAL TEARS 0.4 % Soln Generic drug:  Hypromellose Apply 1 drop to eye 4 (four) times daily.   benzonatate 100 MG capsule Commonly known as:  TESSALON Take 100 mg by mouth 3 (three) times daily as needed for cough.   bisacodyl 10 MG suppository Commonly known as:  DULCOLAX Place 10 mg rectally as needed for moderate constipation.   brimonidine 0.2 % ophthalmic solution Commonly known as:  ALPHAGAN Place 1 drop into the right eye 3 (three) times daily.   calcium carbonate 1250 (500 Ca) MG tablet Commonly known as:  OS-CAL - dosed in mg of elemental calcium Take 1 tablet by mouth daily with breakfast.   citalopram 20 MG tablet Commonly known as:  CELEXA Take 20 mg by mouth daily.   clonazePAM 0.5 MG tablet Commonly known as:  KLONOPIN Take 0.25 mg by mouth daily as needed for anxiety.   docusate sodium 100 MG  capsule Commonly known as:  COLACE Take 200 mg by mouth every morning.   dorzolamide-timolol 22.3-6.8 MG/ML ophthalmic solution Commonly known as:  COSOPT Place 1 drop into both eyes 2 (two) times daily.   doxycycline 100 MG capsule Commonly known as:  VIBRAMYCIN Take 1 capsule (100 mg total) by mouth 2 (two) times daily.   guaiFENesin 600 MG 12 hr tablet Commonly known as:  MUCINEX Take 600 mg by mouth every 12 (twelve) hours as needed (congestion).   hydrocortisone 2.5 % rectal cream Commonly known as:  ANUSOL-HC Place 1 application rectally 2 (two) times daily as needed (itching, hemorrhoids.).   ipratropium-albuterol 0.5-2.5 (3) MG/3ML Soln Commonly known as:  DUONEB Take 3 mLs by nebulization 3 (three) times daily. For PNA.   levothyroxine 25 MCG tablet Commonly known as:  SYNTHROID, LEVOTHROID Take 12.5 mcg by mouth daily before breakfast.   MELATONIN PO Take 6 mg by mouth at bedtime.   MILK OF MAGNESIA 400 MG/5ML suspension Generic drug:  magnesium hydroxide Take 30 mLs by mouth. Every 72 hours as needed   multivitamin with minerals Tabs tablet Take 1 tablet by mouth daily.   OXYGEN Inhale 2 L/min into the lungs as needed (low o2 levels).   prednisoLONE acetate 1 % ophthalmic suspension Commonly known as:  PRED FORTE Place 1 drop into the right eye 2 (two) times daily.   QUEtiapine 25 MG tablet Commonly known as:  SEROQUEL Take 50 mg by mouth at bedtime. Give at bedtime with 100 mg quetiapine to equal 150 mg.   QUEtiapine 100 MG tablet Commonly known as:  SEROQUEL Take 100 mg by mouth at bedtime. Give with 50 mg to equal total dose of 150 mg.   ranitidine 300 MG tablet Commonly known as:  ZANTAC Take 300 mg by mouth at bedtime.   SKIN PREP WIPES Misc 1 application by Does not apply route daily. Apply topically to R and L heel every day shift for preventative. Ensure feet are floating with pillow.   vitamin B-12 1000 MCG tablet Commonly known as:   CYANOCOBALAMIN Take 1,000 mcg by mouth daily.      Follow-up Information    Nestor Lewandowsky, MD. Schedule an appointment as soon as possible for a visit in 2 week(s).   Specialty:  Orthopedic Surgery Why:  Femur fracture follow-up Contact information: [address] [place] [address] [phone]          Allergies  Allergen Reactions  . Ciprofloxacin     unknown  . Effexor [Venlafaxine]     Intolerance  . Enablex [Darifenacin Hydrobromide Er]     unknown  . Macrobid [employer] Macro]     unknown  . Metronidazole And Related     unknown  . Nsaids     unknown  . Reclast [Zoledronic Acid]     unknown  . Sulfa Antibiotics     unknown    Consultations:  Orthopedic surgery   Procedures/Studies: Dg Chest 2 View  Result Date: 10/10/2016 CLINICAL DATA:  Cough, fever EXAM: CHEST  2 VIEW COMPARISON:  08/06/2015 FINDINGS: Consolidation noted in the left lower lobe compatible with pneumonia. No confluent opacity on the right. Heart is normal size. No visible effusions or acute bony abnormality. Changes of prior multi level vertebroplasty in the mid to lower thoracic spine. IMPRESSION: Consolidation in the left lower lobe compatible with pneumonia. Followup PA and lateral chest X-ray is recommended in 3-4 weeks following trial of antibiotic therapy to ensure resolution and exclude underlying malignancy. Electronically Signed   By: Charlett Nose M.D.   On: 10/10/2016 13:33   Dg Tibia/fibula Right  Result Date: 10/10/2016 CLINICAL DATA:  Onset right lower leg pain today. No known injury. Initial encounter. EXAM: RIGHT TIBIA AND FIBULA - 2 VIEW COMPARISON:  None. FINDINGS: There is no evidence of fracture or other focal bone lesions. Bones are osteopenic. Soft tissues are unremarkable. IMPRESSION: No acute abnormality. Osteopenia. Electronically Signed   By: Drusilla Kanner M.D.   On: 10/10/2016 18:00   Dg Ankle Complete Right  Result Date: 10/10/2016 CLINICAL  DATA:  Onset right ankle pain today. No known injury. Initial encounter. EXAM: RIGHT ANKLE - COMPLETE 3+ VIEW COMPARISON:  None. FINDINGS: No acute bony or joint abnormality is seen. Bones are osteopenic. No notable degenerative disease. Soft tissues are unremarkable. IMPRESSION: Osteopenia.  No acute abnormality. Electronically Signed   By: Drusilla Kanner M.D.   On: 10/10/2016 18:00   Dg Knee Complete 4 Views Right  Result Date: 10/10/2016 CLINICAL DATA:  Onset right knee pain today. No known injury. Initial encounter. EXAM: RIGHT KNEE - COMPLETE 4+ VIEW COMPARISON:  None. FINDINGS: There is a nondisplaced fracture through the medial epicondyle of the femur. No other fracture is identified. Bones are markedly osteopenic. Small joint effusion noted. IMPRESSION: Acute nondisplaced fracture medial epicondyle of the right femur appears incomplete. Osteopenia. Small effusion. Electronically Signed   By: Maisie Fus  Dalessio M.D.   On: 10/10/2016 18:02   Dg Foot Complete Right  Result Date: 10/10/2016 CLINICAL DATA:  Onset right foot pain today. No known injury. Initial encounter. EXAM: RIGHT FOOT COMPLETE - 3+ VIEW COMPARISON:  None. FINDINGS: No acute bony or joint abnormality is identified. No notable arthropathy. Bones are markedly osteopenic. IMPRESSION: No acute abnormality. Osteopenia. Electronically Signed   By: Drusilla Kanner M.D.   On: 10/10/2016 18:02      Subjective: Cough improved. No dyspnea.  Discharge Exam: Vitals:   10/12/16 0545 10/12/16 1200  BP: 108/67   Pulse: 85 64  Resp: 20 18  Temp: 98 F (36.7 C)    Vitals:   10/11/16 0436 10/11/16 2048 10/12/16 0545 10/12/16 1200  BP: 128/66 123/65 108/67   Pulse: 73 67 85 64  Resp: 18 20 20 18   Temp: 98.4 F (36.9 C) 97.9 F (36.6 C) 98 F (36.7 C)   TempSrc: Axillary Oral Oral   SpO2: 97% 100% 98% 100%  Weight:      Height:        General exam: Appears calm and comfortable Respiratory system: Clear to auscultation.  Respiratory effort normal. Cardiovascular system: S1 & S2 heard, RRR. No murmurs, rubs, gallops or clicks. Gastrointestinal system: Abdomen is nondistended, soft and nontender. No organomegaly or masses felt. Normal bowel sounds heard. Central nervous system: Alert. No focal neurological deficits. Musculoskeletal: No edema. No calf tenderness. Reproducible chest pain. Leg immobilizer in place Skin: No cyanosis. No rashes Psychiatry: Judgement and insight appear normal. Mood & affect appropriate.    The results of significant diagnostics from this hospitalization (including imaging, microbiology, ancillary and laboratory) are listed below for reference.     Microbiology: Recent Results (from the past 240 hour(s))  Blood culture (routine x 2)     Status: None (Preliminary result)   Collection Time: 10/10/16  1:00 PM  Result Value Ref Range Status   Specimen Description BLOOD BLOOD LEFT FOREARM  Final   Special Requests BOTTLES DRAWN AEROBIC AND ANAEROBIC 10CC  Final   Culture   Final    NO GROWTH 1 DAY Performed at [facility]    Report Status PENDING  Incomplete  Blood culture (routine x 2)     Status: None (Preliminary result)   Collection Time: 10/10/16  1:04 PM  Result Value Ref Range Status   Specimen Description BLOOD RIGHT HAND  Final   Special Requests BOTTLES DRAWN AEROBIC AND ANAEROBIC 5CC  Final   Culture   Final    NO GROWTH 1 DAY Performed at [facility]    Report Status PENDING  Incomplete  Urine culture     Status: Abnormal   Collection Time: 10/10/16  2:05 PM  Result Value Ref Range Status   Specimen Description URINE, RANDOM  Final   Special Requests NONE  Final   Culture >=100,000 COLONIES/mL PROTEUS MIRABILIS (A)  Final   Report Status 10/12/2016 FINAL  Final   Organism ID, Bacteria PROTEUS MIRABILIS (A)  Final      Susceptibility   Proteus mirabilis - MIC*    AMPICILLIN <=2 SENSITIVE Sensitive     CEFAZOLIN <=4 SENSITIVE Sensitive      CEFTRIAXONE <=1 SENSITIVE Sensitive     CIPROFLOXACIN 1 SENSITIVE Sensitive     GENTAMICIN <=1 SENSITIVE Sensitive     IMIPENEM 1 SENSITIVE Sensitive     NITROFURANTOIN 128 RESISTANT Resistant     TRIMETH/SULFA <=20 SENSITIVE Sensitive     AMPICILLIN/SULBACTAM <=  2 SENSITIVE Sensitive     PIP/TAZO <=4 SENSITIVE Sensitive     * >=100,000 COLONIES/mL PROTEUS MIRABILIS  MRSA PCR Screening     Status: Abnormal   Collection Time: 10/10/16  8:07 PM  Result Value Ref Range Status   MRSA by PCR POSITIVE (A) NEGATIVE Final    Comment:        The GeneXpert MRSA Assay (FDA approved for NASAL specimens only), is one component of a comprehensive MRSA colonization surveillance program. It is not intended to diagnose MRSA infection nor to guide or monitor treatment for MRSA infections. RESULT CALLED TO, READ BACK BY AND VERIFIED WITH: Karma Greaser [id number] @ 2220 BY J SCOTTON   Culture, blood (routine x 2) Call MD if unable to obtain prior to antibiotics being given     Status: None (Preliminary result)   Collection Time: 10/10/16  8:49 PM  Result Value Ref Range Status   Specimen Description BLOOD LEFT HAND  Final   Special Requests BOTTLES DRAWN AEROBIC ONLY 5CC  Final   Culture   Final    NO GROWTH < 24 HOURS Performed at [facility]    Report Status PENDING  Incomplete  Culture, blood (routine x 2) Call MD if unable to obtain prior to antibiotics being given     Status: None (Preliminary result)   Collection Time: 10/10/16  8:49 PM  Result Value Ref Range Status   Specimen Description BLOOD LEFT ARM  Final   Special Requests BOTTLES DRAWN AEROBIC ONLY 5CC  Final   Culture   Final    NO GROWTH < 24 HOURS Performed at [facility]    Report Status PENDING  Incomplete     Labs: BNP (last 3 results) No results for input(s): BNP in the last 8760 hours. Basic Metabolic Panel:  Recent Labs Lab 10/10/16 1301 10/10/16 2049 10/11/16 0409  NA 149*  --  143  K 4.3  --   3.5  CL 111  --  113*  CO2 27  --  25  GLUCOSE 147*  --  97  BUN 56*  --  47*  CREATININE 1.41* 1.07* 0.83  CALCIUM 9.6  --  8.3*   Liver Function Tests:  Recent Labs Lab 10/10/16 1301  AST 19  ALT 11*  ALKPHOS 82  BILITOT 0.8  PROT 7.9  ALBUMIN 3.2*   No results for input(s): LIPASE, AMYLASE in the last 168 hours. No results for input(s): AMMONIA in the last 168 hours. CBC:  Recent Labs Lab 10/10/16 1301 10/10/16 2049 10/11/16 0409  WBC 19.9* 18.0* 14.2*  NEUTROABS 17.9*  --   --   HGB 11.5* 10.3* 9.0*  HCT 34.9* 33.0* 29.5*  MCV 94.8 97.6 97.0  PLT 338 336 314   Cardiac Enzymes:  Recent Labs Lab 10/11/16 1401 10/11/16 1922 10/12/16 0139 10/12/16 0809  TROPONINI 0.09* 0.10* 0.10* 0.08*   BNP: Invalid input(s): POCBNP CBG: No results for input(s): GLUCAP in the last 168 hours. D-Dimer No results for input(s): DDIMER in the last 72 hours. Hgb A1c No results for input(s): HGBA1C in the last 72 hours. Lipid Profile No results for input(s): CHOL, HDL, LDLCALC, TRIG, CHOLHDL, LDLDIRECT in the last 72 hours. Thyroid function studies No results for input(s): TSH, T4TOTAL, T3FREE, THYROIDAB in the last 72 hours.  Invalid input(s): FREET3 Anemia work up No results for input(s): VITAMINB12, FOLATE, FERRITIN, TIBC, IRON, RETICCTPCT in the last 72 hours. Urinalysis    Component Value Date/Time  COLORURINE AMBER (A) 10/10/2016 1405   APPEARANCEUR CLOUDY (A) 10/10/2016 1405   LABSPEC 1.025 10/10/2016 1405   PHURINE 8.0 10/10/2016 1405   GLUCOSEU NEGATIVE 10/10/2016 1405   HGBUR NEGATIVE 10/10/2016 1405   BILIRUBINUR NEGATIVE 10/10/2016 1405   KETONESUR NEGATIVE 10/10/2016 1405   PROTEINUR 100 (A) 10/10/2016 1405   NITRITE NEGATIVE 10/10/2016 1405   LEUKOCYTESUR LARGE (A) 10/10/2016 1405   Sepsis Labs Invalid input(s): PROCALCITONIN,  WBC,  LACTICIDVEN Microbiology Recent Results (from the past 240 hour(s))  Blood culture (routine x 2)     Status:  None (Preliminary result)   Collection Time: 10/10/16  1:00 PM  Result Value Ref Range Status   Specimen Description BLOOD BLOOD LEFT FOREARM  Final   Special Requests BOTTLES DRAWN AEROBIC AND ANAEROBIC 10CC  Final   Culture   Final    NO GROWTH 1 DAY Performed at [facility]    Report Status PENDING  Incomplete  Blood culture (routine x 2)     Status: None (Preliminary result)   Collection Time: 10/10/16  1:04 PM  Result Value Ref Range Status   Specimen Description BLOOD RIGHT HAND  Final   Special Requests BOTTLES DRAWN AEROBIC AND ANAEROBIC 5CC  Final   Culture   Final    NO GROWTH 1 DAY Performed at [facility]    Report Status PENDING  Incomplete  Urine culture     Status: Abnormal   Collection Time: 10/10/16  2:05 PM  Result Value Ref Range Status   Specimen Description URINE, RANDOM  Final   Special Requests NONE  Final   Culture >=100,000 COLONIES/mL PROTEUS MIRABILIS (A)  Final   Report Status 10/12/2016 FINAL  Final   Organism ID, Bacteria PROTEUS MIRABILIS (A)  Final      Susceptibility   Proteus mirabilis - MIC*    AMPICILLIN <=2 SENSITIVE Sensitive     CEFAZOLIN <=4 SENSITIVE Sensitive     CEFTRIAXONE <=1 SENSITIVE Sensitive     CIPROFLOXACIN 1 SENSITIVE Sensitive     GENTAMICIN <=1 SENSITIVE Sensitive     IMIPENEM 1 SENSITIVE Sensitive     NITROFURANTOIN 128 RESISTANT Resistant     TRIMETH/SULFA <=20 SENSITIVE Sensitive     AMPICILLIN/SULBACTAM <=2 SENSITIVE Sensitive     PIP/TAZO <=4 SENSITIVE Sensitive     * >=100,000 COLONIES/mL PROTEUS MIRABILIS  MRSA PCR Screening     Status: Abnormal   Collection Time: 10/10/16  8:07 PM  Result Value Ref Range Status   MRSA by PCR POSITIVE (A) NEGATIVE Final    Comment:        The GeneXpert MRSA Assay (FDA approved for NASAL specimens only), is one component of a comprehensive MRSA colonization surveillance program. It is not intended to diagnose MRSA infection nor to guide or monitor  treatment for MRSA infections. RESULT CALLED TO, READ BACK BY AND VERIFIED WITH: Karma Greaser [id number] @ 2220 BY J SCOTTON   Culture, blood (routine x 2) Call MD if unable to obtain prior to antibiotics being given     Status: None (Preliminary result)   Collection Time: 10/10/16  8:49 PM  Result Value Ref Range Status   Specimen Description BLOOD LEFT HAND  Final   Special Requests BOTTLES DRAWN AEROBIC ONLY 5CC  Final   Culture   Final    NO GROWTH < 24 HOURS Performed at [facility]    Report Status PENDING  Incomplete  Culture, blood (routine x 2) Call MD if  unable to obtain prior to antibiotics being given     Status: None (Preliminary result)   Collection Time: 10/10/16  8:49 PM  Result Value Ref Range Status   Specimen Description BLOOD LEFT ARM  Final   Special Requests BOTTLES DRAWN AEROBIC ONLY 5CC  Final   Culture   Final    NO GROWTH < 24 HOURS Performed at [facility]    Report Status PENDING  Incomplete     Time coordinating discharge: Over 30 minutes  SIGNED:   Jacquelin Hawking, MD [facility] 10/12/2016, 1:52 PM Pager [phone]  If 7PM-7AM, please contact night-coverage [email or web address] Password [email or web address]

## 2016-10-12 NOTE — Progress Notes (Signed)
Patient ID: Ashley Swanson, female   DOB: 06/09/1929, 83 y.o.   MRN: [id number] Subjective: Patient still moderately confused, has productive cough, but is able to sit up in bed with knee immobilizer in place.  Objective: Right knee immobilizer examined knee is still flexed about 20 but patient is much less tender and irritable to movement.  Assessment: Nondisplaced right knee medial femoral condyle fracture in 83 year old woman who is nursing home resident and long-term nonambulator.  Plan: Knee immobilizer for the next 6-8 weeks. Return to see me in the office in 2 weeks for follow-up with x-rays. Immobilizer may be ruled moved for cleansing and bathing.

## 2016-10-12 NOTE — [place] FL2 (Signed)
[place] MEDICAID FL2 LEVEL OF CARE SCREENING TOOL     IDENTIFICATION  Patient Name: Ashley Swanson Birthdate: August 03, 1929 Sex: female Admission Date (Current Location): 10/10/2016  [facility] and [place] Number:  Producer, television/film/video and Address:  [facility],  [address] [place] [address]      Provider Number: [id number]  Attending Physician Name and Address:  Narda Bonds, MD  Relative Name and Phone Number:       Current Level of Care: Hospital Recommended Level of Care: Skilled Nursing Facility Prior Approval Number:    Date Approved/Denied:   PASRR Number:    Discharge Plan: SNF    Current Diagnoses: Patient Active Problem List   Diagnosis Date Noted  . Pressure injury of skin 10/11/2016  . HCAP (healthcare-associated pneumonia) 10/10/2016  . Acute on chronic respiratory failure with hypoxia (HCC) 10/10/2016  . Loss of weight 08/10/2016  . Paranoid schizophrenia, chronic condition (HCC) 02/23/2016  . Chronic respiratory failure with hypoxia (HCC) 02/23/2016  . Dysphagia, pharyngoesophageal phase 11/21/2015  . Depression, major, recurrent, severe with psychosis (HCC) 10/14/2015  . Supplemental oxygen dependent 08/12/2015  . Chronic constipation 08/12/2015  . Glaucoma 08/12/2015  . Hypothyroidism 08/07/2015  . Thoracic compression fracture (HCC) 08/06/2015  . GERD (gastroesophageal reflux disease)   . Essential hypertension     Orientation RESPIRATION BLADDER Height & Weight     Self, Situation, Place  O2 Incontinent Weight: 110 lb (49.9 kg) Height:  5\' 2"  (157.5 cm)  BEHAVIORAL SYMPTOMS/MOOD NEUROLOGICAL BOWEL NUTRITION STATUS  Other (Comment) (no behaviors)   Continent Diet  AMBULATORY STATUS COMMUNICATION OF NEEDS Skin   Extensive Assist Verbally  (stage 1 on sacrum)                       Personal Care Assistance Level of Assistance  Bathing, Feeding, Dressing Bathing Assistance: Maximum assistance Feeding assistance:  Limited assistance Dressing Assistance: Maximum assistance     Functional Limitations Info  Sight, Hearing, Speech Sight Info: Impaired Hearing Info: Impaired Speech Info: Adequate    SPECIAL CARE FACTORS FREQUENCY                       Contractures Contractures Info: Not present    Additional Factors Info  Code Status, Isolation Precautions, Allergies Code Status Info: Full       Isolation Precautions Info: 1.10.18 mrsa by pcr     Current Medications (10/12/2016):  This is the current hospital active medication list Current Facility-Administered Medications  Medication Dose Route Frequency Provider Last Rate Last Dose  . acetaminophen (TYLENOL) tablet 650 mg  650 mg Oral Q8H Narda Bonds, MD   650 mg at 10/12/16 0604  . benzonatate (TESSALON) capsule 100 mg  100 mg Oral TID PRN Narda Bonds, MD      . brimonidine (ALPHAGAN) 0.2 % ophthalmic solution 1 drop  1 drop Right Eye TID Narda Bonds, MD   1 drop at 10/12/16 1101  . calcium carbonate (OS-CAL - dosed in mg of elemental calcium) tablet 500 mg of elemental calcium  1 tablet Oral Q breakfast Narda Bonds, MD   500 mg of elemental calcium at 10/12/16 1100  . ceFEPIme (MAXIPIME) 1 g in dextrose 5 % 50 mL IVPB  1 g Intravenous Q24H Aleda Grana, RPH   1 g at 10/12/16 1059  . Chlorhexidine Gluconate Cloth 2 % PADS 6 each  6 each Topical Q0600  Narda Bondsalph A Nettey, MD   6 each at 10/12/16 0600  . citalopram (CELEXA) tablet 20 mg  20 mg Oral Daily Narda Bondsalph A Nettey, MD   20 mg at 10/12/16 1100  . clonazePAM (KLONOPIN) tablet 0.25 mg  0.25 mg Oral Daily PRN Narda Bondsalph A Nettey, MD      . docusate sodium (COLACE) capsule 200 mg  200 mg Oral q morning - 10a Narda Bondsalph A Nettey, MD   200 mg at 10/12/16 1059  . dorzolamide-timolol (COSOPT) 22.3-6.8 MG/ML ophthalmic solution 1 drop  1 drop Both Eyes BID Narda Bondsalph A Nettey, MD   1 drop at 10/12/16 1101  . enoxaparin (LOVENOX) injection 40 mg  40 mg Subcutaneous Q24H Randall K Absher, RPH   40  mg at 10/11/16 2300  . famotidine (PEPCID) tablet 40 mg  40 mg Oral QHS Narda Bondsalph A Nettey, MD   40 mg at 10/11/16 2300  . guaiFENesin (MUCINEX) 12 hr tablet 600 mg  600 mg Oral Q12H PRN Narda Bondsalph A Nettey, MD      . levothyroxine (SYNTHROID, LEVOTHROID) tablet 12.5 mcg  12.5 mcg Oral QAC breakfast Narda Bondsalph A Nettey, MD      . morphine 2 MG/ML injection 2 mg  2 mg Intravenous Q4H PRN Narda Bondsalph A Nettey, MD   2 mg at 10/11/16 2307  . mupirocin ointment (BACTROBAN) 2 % 1 application  1 application Nasal BID Narda Bondsalph A Nettey, MD   1 application at 10/12/16 1058  . polyvinyl alcohol (LIQUIFILM TEARS) 1.4 % ophthalmic solution 1 drop  1 drop Both Eyes QID Narda Bondsalph A Nettey, MD   1 drop at 10/12/16 1102  . prednisoLONE acetate (PRED FORTE) 1 % ophthalmic suspension 1 drop  1 drop Right Eye BID Narda Bondsalph A Nettey, MD   1 drop at 10/12/16 1102  . QUEtiapine (SEROQUEL) tablet 150 mg  150 mg Oral QHS Narda Bondsalph A Nettey, MD   150 mg at 10/11/16 2300  . RESOURCE THICKENUP CLEAR   Oral PRN Narda Bondsalph A Nettey, MD      . vancomycin (VANCOCIN) 500 mg in sodium chloride 0.9 % 100 mL IVPB  500 mg Intravenous Q24H Aleda Granaustin G Zeigler, RPH   500 mg at 10/11/16 1642  . vitamin B-12 (CYANOCOBALAMIN) tablet 1,000 mcg  1,000 mcg Oral Daily Narda Bondsalph A Nettey, MD   1,000 mcg at 10/12/16 1059     Discharge Medications: Please see discharge summary for a list of discharge medications.  Relevant Imaging Results:  Relevant Lab Results:   Additional Information SSN [id number]  Daemian Gahm, Dickey GaveJamie Lee, LCSW

## 2016-10-12 NOTE — Progress Notes (Signed)
Pharmacy Antibiotic Note  Ashley Swanson is a 83 y.o. female admitted on 10/10/2016 with pneumonia.  Pharmacy has been consulted for vancomycin and cefepime dosing. Patient presents from nursing home with reports of shortness of breath, cough, and fever. CXR LLL pneumonia and now with proteus UTI as well   Plan: Day 3 Abxs  Continue vancomycin 500mg  IV q24h  Continue Cefepime 1g IV q24h  Follow culture results - pansensitive proteus UTI  Follow renal function, adjust doses and check vancomycin trough as indicated   Temp (24hrs), Avg:98 F (36.7 C), Min:97.9 F (36.6 C), Max:98 F (36.7 C)   Recent Labs Lab 10/10/16 1301 10/10/16 1307 10/10/16 1550 10/10/16 2049 10/11/16 0409  WBC 19.9*  --   --  18.0* 14.2*  CREATININE 1.41*  --   --  1.07* 0.83  LATICACIDVEN  --  2.35* 2.27* 1.8  --     Estimated Creatinine Clearance: 39 mL/min (by C-G formula based on SCr of 0.83 mg/dL).    Allergies  Allergen Reactions  . Ciprofloxacin     unknown  . Effexor [Venlafaxine]     Intolerance  . Enablex [Darifenacin Hydrobromide Er]     unknown  . Macrobid [employer][Nitrofurantoin Monohyd Macro]     unknown  . Metronidazole And Related     unknown  . Nsaids     unknown  . Reclast [Zoledronic Acid]     unknown  . Sulfa Antibiotics     unknown    Antimicrobials this admission: 1/10 cefepime >> 1/10 vancomycin >>  Dose adjustments this admission:  Microbiology results: 1/10 BCx x 4: ngtd 1/10 UCx: gram negative rods - proteus - pansensitive 1/10 MRSA: positive  Thank you for allowing pharmacy to be a part of this patient's care.  Hessie KnowsJustin M Lavine Hargrove, PharmD, BCPS Pager [phone] 10/12/2016 1:22 PM

## 2016-10-13 LAB — LEGIONELLA PNEUMOPHILA SEROGP 1 UR AG: L. pneumophila Serogp 1 Ur Ag: NEGATIVE

## 2016-10-15 ENCOUNTER — Non-Acute Institutional Stay (SKILLED_NURSING_FACILITY): Payer: Medicare Other | Admitting: Adult Health

## 2016-10-15 ENCOUNTER — Encounter: Payer: Self-pay | Admitting: Adult Health

## 2016-10-15 DIAGNOSIS — F200 Paranoid schizophrenia: Secondary | ICD-10-CM | POA: Diagnosis not present

## 2016-10-15 DIAGNOSIS — S72454D Nondisplaced supracondylar fracture without intracondylar extension of lower end of right femur, subsequent encounter for closed fracture with routine healing: Secondary | ICD-10-CM | POA: Diagnosis not present

## 2016-10-15 DIAGNOSIS — F0150 Vascular dementia without behavioral disturbance: Secondary | ICD-10-CM | POA: Diagnosis not present

## 2016-10-15 DIAGNOSIS — J9611 Chronic respiratory failure with hypoxia: Secondary | ICD-10-CM | POA: Diagnosis not present

## 2016-10-15 DIAGNOSIS — J189 Pneumonia, unspecified organism: Secondary | ICD-10-CM | POA: Diagnosis not present

## 2016-10-15 DIAGNOSIS — F333 Major depressive disorder, recurrent, severe with psychotic symptoms: Secondary | ICD-10-CM | POA: Diagnosis not present

## 2016-10-15 DIAGNOSIS — S72454A Nondisplaced supracondylar fracture without intracondylar extension of lower end of right femur, initial encounter for closed fracture: Secondary | ICD-10-CM | POA: Insufficient documentation

## 2016-10-15 DIAGNOSIS — R1314 Dysphagia, pharyngoesophageal phase: Secondary | ICD-10-CM | POA: Diagnosis not present

## 2016-10-15 LAB — CULTURE, BLOOD (ROUTINE X 2)
CULTURE: NO GROWTH
Culture: NO GROWTH
Culture: NO GROWTH
Culture: NO GROWTH

## 2016-10-15 NOTE — Progress Notes (Signed)
Location:   Database administrator of Service:  SNF (31)   CODE STATUS: dnr  Allergies  Allergen Reactions  . Ciprofloxacin     unknown  . Effexor [Venlafaxine]     Intolerance  . Enablex [Darifenacin Hydrobromide Er]     unknown  . Macrobid [employer] Macro]     unknown  . Metronidazole And Related     unknown  . Nsaids     unknown  . Reclast [Zoledronic Acid]     unknown  . Sulfa Antibiotics     unknown    Chief Complaint  Patient presents with  . Hospitalization Follow-up    HPI:  She is a long term resident of this facility who has been hospitalized for a right non displaced medial condyle fracture of the right knee ulnarly visible on the AP view. She is not appropriate for surgical intervention and had a knee immobilizer placed. She will need follow up with ortho in 2-3 weeks. She is unable to fully participate in the hpi or ros; but did tell me that her knee is painful. There are no nursing concerns at this time.    Past Medical History:  Diagnosis Date  . Cataract   . Depression   . Diverticulosis   . GERD (gastroesophageal reflux disease)   . Hyperlipidemia   . Hypertension   . Osteoporosis   . Stasis dermatitis   . Thyroid disease    hypothyroid    Past Surgical History:  Procedure Laterality Date  . ABDOMINAL HYSTERECTOMY    . APPENDECTOMY    . BREAST SURGERY     biopsy  . CATARACT EXTRACTION    . CHOLECYSTECTOMY    . TONSILECTOMY/ADENOIDECTOMY WITH MYRINGOTOMY    . TUBAL LIGATION    . vertebrolplasty      Social History   Social History  . Marital status: Widowed    Spouse name: N/A  . Number of children: N/A  . Years of education: N/A   Occupational History  . Not on file.   Social History Main Topics  . Smoking status: Former Games developer  . Smokeless tobacco: Not on file  . Alcohol use Yes  . Drug use: Unknown  . Sexual activity: Not on file   Other Topics Concern  . Not on file   Social History Narrative  .  No narrative on file   Family History  Problem Relation Age of Onset  . Aortic aneurysm Mother   . Cancer Father     mouth  . Diabetes Neg Hx   . Coronary artery disease Neg Hx       VITAL SIGNS BP (!) 154/67   Pulse 77   Temp 99.7 F (37.6 C)   Resp 18   Ht 5\' 2"  (1.575 m)   Wt 110 lb (49.9 kg)   SpO2 96%   BMI 20.12 kg/m   Patient's Medications  New Prescriptions   No medications on file  Previous Medications   ACETAMINOPHEN (TYLENOL) 325 MG TABLET    Take 650 mg by mouth every 8 (eight) hours.   ALUMINUM-MAGNESIUM HYDROXIDE-SIMETHICONE (MAALOX) 200-200-20 MG/5ML SUSP    Take 15 mLs by mouth every 4 (four) hours as needed (reflux).   AMOXICILLIN-CLAVULANATE (AUGMENTIN) 875-125 MG TABLET    Take 1 tablet by mouth 2 (two) times daily.   BENZOCAINE (ANBESOL) 10 % MUCOSAL GEL    Use as directed 1 application in the mouth or throat every 8 (eight)  hours as needed for mouth pain.    BENZONATATE (TESSALON) 100 MG CAPSULE    Take 100 mg by mouth 3 (three) times daily as needed for cough.   BISACODYL (DULCOLAX) 10 MG SUPPOSITORY    Place 10 mg rectally as needed for moderate constipation.   BRIMONIDINE (ALPHAGAN) 0.2 % OPHTHALMIC SOLUTION    Place 1 drop into the right eye 3 (three) times daily.   CALCIUM CARBONATE (OS-CAL - DOSED IN MG OF ELEMENTAL CALCIUM) 1250 (500 CA) MG TABLET    Take 1 tablet by mouth daily with breakfast.   CITALOPRAM (CELEXA) 20 MG TABLET    Take 20 mg by mouth daily.    CLONAZEPAM (KLONOPIN) 0.5 MG TABLET    Take 0.25 mg by mouth daily as needed for anxiety.   DOCUSATE SODIUM (COLACE) 100 MG CAPSULE    Take 200 mg by mouth every morning.   DORZOLAMIDE-TIMOLOL (COSOPT) 22.3-6.8 MG/ML OPHTHALMIC SOLUTION    Place 1 drop into both eyes 2 (two) times daily.   DOXYCYCLINE (VIBRAMYCIN) 100 MG CAPSULE    Take 1 capsule (100 mg total) by mouth 2 (two) times daily.   FOOD THICKENER (THICK IT) POWD    Take 1 Container by mouth as needed.   GUAIFENESIN (MUCINEX) 600  MG 12 HR TABLET    Take 600 mg by mouth every 12 (twelve) hours as needed (congestion).   HYDROCORTISONE (ANUSOL-HC) 2.5 % RECTAL CREAM    Place 1 application rectally 2 (two) times daily as needed (itching, hemorrhoids.).    HYPROMELLOSE (ARTIFICIAL TEARS) 0.4 % SOLN    Apply 1 drop to eye 4 (four) times daily.   IPRATROPIUM-ALBUTEROL (DUONEB) 0.5-2.5 (3) MG/3ML SOLN    Take 3 mLs by nebulization 3 (three) times daily. For PNA.   LEVOTHYROXINE (SYNTHROID, LEVOTHROID) 25 MCG TABLET    Take 12.5 mcg by mouth daily before breakfast.   MAGNESIUM HYDROXIDE (MILK OF MAGNESIA) 400 MG/5ML SUSPENSION    Take 30 mLs by mouth. Every 72 hours as needed   MELATONIN PO    Take 6 mg by mouth at bedtime.   MULTIPLE VITAMIN (MULTIVITAMIN WITH MINERALS) TABS TABLET    Take 1 tablet by mouth daily.   OSTOMY SUPPLIES (SKIN PREP WIPES) MISC    1 application by Does not apply route daily. Apply topically to R and L heel every day shift for preventative. Ensure feet are floating with pillow.   OXYGEN    Inhale 2 L/min into the lungs as needed (low o2 levels).   PREDNISOLONE ACETATE (PRED FORTE) 1 % OPHTHALMIC SUSPENSION    Place 1 drop into the right eye 2 (two) times daily.    QUETIAPINE (SEROQUEL) 100 MG TABLET    Take 100 mg by mouth at bedtime. Give with 50 mg to equal total dose of 150 mg.   QUETIAPINE (SEROQUEL) 25 MG TABLET    Take 50 mg by mouth at bedtime. Give at bedtime with 100 mg quetiapine to equal 150 mg.   RANITIDINE (ZANTAC) 300 MG TABLET    Take 300 mg by mouth at bedtime.   VITAMIN B-12 (CYANOCOBALAMIN) 1000 MCG TABLET    Take 1,000 mcg by mouth daily.  Modified Medications   No medications on file  Discontinued Medications   ACETAMINOPHEN (TYLENOL) 500 MG TABLET    Take 1-2 tablets (500-1,000 mg total) by mouth every 8 (eight) hours as needed for mild pain or moderate pain.     SIGNIFICANT DIAGNOSTIC EXAMS   08-06-15:  ct of head and cervical spine: 1. No acute intracranial pathology.2.  Nondisplaced fracture of the left inferior articulating facet of C6. There is a sclerotic margin on either side of the fracture suggesting a subacute or chronic injury. There is no other fracture or subluxation of the cervical spine. If there is further clinical concern, recommend MRI. There is surgical follow-up is recommended.  08-06-15: ct of thoracic spine: . Compression fractures of T8, T10 and T11. T10 and T11 are chronic, with the T8 fracture being of unclear chronicity.  08-31-15: EKG: sinus bradycardia  02-29-16: chest x-ray: no evidence of acute cardiopulmonary disease  02-29-16: EKG: normal sinus rhythm with first degree AV block   03-20-16: chest x-ray:  early active process left upper lobe  10-10-16: right foot x-ray: No acute abnormality. Osteopenia.  10-10-16: right knee x-ray: Acute nondisplaced fracture medial epicondyle of the right femur appears incomplete. Osteopenia. Small effusion.  10-10-16: right tibia/fibula x-ray: No acute abnormality. Osteopenia.   10-10-16: right ankle x-ray: Osteopenia. No acute abnormality.  10-10-16: chest x-ray: Consolidation in the left lower lobe compatible with pneumonia. Followup PA and lateral chest X-ray is recommended in 3-4 weeks following trial of antibiotic therapy to ensure resolution and exclude underlying malignancy.    LABS REVIEWED:   02-24-16: wbc 6.0; hgb 11.4; hct 34.5; mcv 95.6 plt 192; glucose 92; bun 20; creat 0.81; k+ 4.2; na++ 143; liver normal albumin 3.0; tsh 4.50; hgb a1c 5.7   06-27-16: wbc 7.0; hgb 11.9; hct 36.5; mcv 92.4 plt 221; glucose 88'; bun 19' creat 0.70; k+ 4.2; na++ 142 liver normal albumin 3.5; tsh 5.53; hgb a1c 5.0  09-01-16: urine culture: no growth 08-28-16: tsh 2.59 10-10-16: wbc 19.9; hgb 11.5; hct 34.9; mcv 94.8; plt 338; glucose 147; bun 56; creat 1.41; k+ 4.3; na++ 149; liver normal albumin 3.2; blood culture: no growth: urine culture: proteus mirabilis: augmentin/doxycyline; HIV: nr 10-11-16: wbc 14.2;  hgb 9.0 ;hct 29.5; mcv 97.0; plt 314; glucose 97; bun 47; creat 0.83; k+ 3.5; na++ 143    Review of Systems  Unable to perform ROS: dementia     Physical Exam  Vitals reviewed. Constitutional: No distress. frail  Eyes: Conjunctivae are normal.  Neck: Neck supple. No JVD present. No thyromegaly present.  Cardiovascular: Normal rate, regular rhythm and intact distal pulses.   Respiratory: . No respiratory distress. She has no wheezes. breath sounds diminished throughout.   GI: Soft. Bowel sounds are normal. She exhibits no distension. There is no tenderness.  Musculoskeletal: She exhibits no edema.  Able to move all extremities Right knee immobilizer in place      Lymphadenopathy:    She has no cervical adenopathy.  Neurological: She is alert.  Skin: Skin is warm and dry. She is not diaphoretic.  Psychiatric: is calm   ASSESSMENT/ PLAN:  1. Schizophrenia  With  Major depression with psychosis: is being followed by psych services; will continue  celexa 20 mg daily   Will continue seroquel 150 mg nightly   Is followed by psych services.   She continues to have delusional thoughts  2. Gerd: will continue zantac 300 mg nightly   3. Constipation:  Will continue colace 200 mg daily;   4. Glaucoma: will continue alphagan to right eye three times daily; cosopt to both eyes twice daily; predforte to right eye twice daily   5. Hypertension: is presently stable is not on medications will monitor   6. Dysphagia: no signs of aspiration present will continue nectar  thick liquids   7. Weight loss: weight is stable at 110 pounds; will continue supplements per facility protocol and will monitor   8. Chronic respiratory failure with hypoxia: is on long term 02 therapy at 2 liters  Will continue duoneb every 8 hours through 10-20-16 then return to every 8 hours  as needed   will monitor her status.   9.  Hypothyroidism: tsh 2.59 will continue synthroid 12.5 mcg daily   10. Chronic pain due to  thoracic compression fracture: will continue tylenol 650 mg three times daily   11. Right medial epicondyle of the right femur: will follow up with orthopedics as indicated; will continue therapy as directed her pain is not adequately controlled; will begin oxycodone 2.5 mg every 4 hours as needed for pain.   12. Vascular dementia: no change in status; currently not on medications; will not make changes will monitor   13. Pneumonia and UTI: will complete augmentin and doxycycline will continue to monitor    Will check cbc; cmp Will get chest x-ray in 4 weeks Will have her follow up with orthopedic in 2 weeks   Time spent with patient  50  minutes >50% time spent counseling; reviewing medical record; tests; labs; and developing future plan of care       MD is aware of resident's narcotic use and is in agreement with current plan of care. We will attempt to wean resident as apropriate   Synthia Innocenteborah Kinzleigh Kandler NP [facility]  Contact [phone] Monday through Friday 8am- 5pm  After hours call [phone]

## 2016-10-16 LAB — BASIC METABOLIC PANEL
BUN: 19 mg/dL (ref 4–21)
Creatinine: 0.6 mg/dL (ref 0.5–1.1)
Glucose: 97 mg/dL
POTASSIUM: 4.3 mmol/L (ref 3.4–5.3)
Sodium: 143 mmol/L (ref 137–147)

## 2016-10-16 LAB — HEPATIC FUNCTION PANEL
ALK PHOS: 101 U/L (ref 25–125)
ALT: 8 U/L (ref 7–35)
AST: 13 U/L (ref 13–35)

## 2016-10-23 ENCOUNTER — Encounter: Payer: Self-pay | Admitting: Internal Medicine

## 2016-10-23 ENCOUNTER — Non-Acute Institutional Stay (SKILLED_NURSING_FACILITY): Payer: Medicare Other | Admitting: Internal Medicine

## 2016-10-23 DIAGNOSIS — J189 Pneumonia, unspecified organism: Secondary | ICD-10-CM

## 2016-10-23 DIAGNOSIS — G894 Chronic pain syndrome: Secondary | ICD-10-CM

## 2016-10-23 DIAGNOSIS — F200 Paranoid schizophrenia: Secondary | ICD-10-CM | POA: Diagnosis not present

## 2016-10-23 DIAGNOSIS — J9611 Chronic respiratory failure with hypoxia: Secondary | ICD-10-CM | POA: Diagnosis not present

## 2016-10-23 DIAGNOSIS — F0150 Vascular dementia without behavioral disturbance: Secondary | ICD-10-CM

## 2016-10-23 DIAGNOSIS — S72454D Nondisplaced supracondylar fracture without intracondylar extension of lower end of right femur, subsequent encounter for closed fracture with routine healing: Secondary | ICD-10-CM

## 2016-10-23 DIAGNOSIS — F333 Major depressive disorder, recurrent, severe with psychotic symptoms: Secondary | ICD-10-CM | POA: Diagnosis not present

## 2016-10-23 DIAGNOSIS — E034 Atrophy of thyroid (acquired): Secondary | ICD-10-CM

## 2016-10-23 NOTE — Progress Notes (Signed)
Patient ID: Ashley Swanson, female   DOB: 1929-04-06, 83 y.o.   MRN: [id number]    HISTORY AND PHYSICAL   DATE: 10/23/2016  Location:    [facility] Room Number: 409 A Place of Service: SNF (31)   Extended Emergency Contact Information Primary Emergency Contact: [name],[name] Address: [address]          [place], [place] [address] Johnnette Litter of [place] Phone: [phone] Mobile Phone: [phone] Relation: Daughter Secondary Emergency Contact: Langeloth, [place] of [place] Phone: [phone] Mobile Phone: [phone] Relation: Son  Advanced Directive information Does Patient Have a Medical Advance Directive?: Yes, Type of Advance Directive: Out of facility DNR (pink MOST or yellow form), Pre-existing out of facility DNR order (yellow form or pink MOST form): Pink MOST form placed in chart (order not valid for inpatient use)  Chief Complaint  Patient presents with  . Readmit To SNF    HPI:  83 yo female long term resident seen today as a readmission into SNF following hospital stay for left HCAP, acute/chronic respiratory failure, AKI, UTI, schizophrenia/dementia. She was tx with IV vanco and cefepime --> po Augmentin and doxycycline. Urine cx (+) proteus mirablis. Right knee xray revealed nondisplaced medial condyle fx and Ortho placed leg immobilizer (will need for 6-8 weeks) as she was not deemed a good surgical candidate. Na 149-->143; albumin 3.2; Cr 1.41-->0.83; WBC 19.9K with abs neutrophils 17.9K --> WBC 14.2K; Hgb 11.5--> 9; urine Legionella and strep neg; HIV NR at d/c. She presents to SNF to continue long term care.  Today she reports cough and SOB. No CP, f/c. Nursing c/a synthroid dose (35mg or 12.580m?). No falls since readmission. Appetite ok and sleeps well. She is a poor historian due to psych d/o. Hx obtained from chart. Knee pain pain controlled onoxycodone 2.5 mg every 4 hours as needed. She has  completed doxy and augmentin  Schizophrenia/MDD with psychosis -  followed by psych services;mood stable on celexa 20 mg daily; seroquel 150 mg nightly. She continues to have delusional thoughts  GERD - stable on zantac 300 mg nightly   Chronic Constipation - stable on colace 200 mg daily;   Glaucoma - stable on alphagan to right eye three times daily; cosopt to both eyes twice daily; predforte to right eye twice daily; followed by eye specialist  Hypertension - diet controlled  Dysphagia  - no signs of aspiration present. Diet is nectar thick liquids   Weight loss - weight stable at 110 pounds; gets nutritional supplements per facility protocol. Albumin 3.2  Chronic respiratory failure with hypoxia - on chronic [place] O2  at 2 liters/min.  She gets duoneb every 8 hours prn  Hypothyroidism - TSH 2.59 in Nov 2017; takes synthroid 12.5 mcg daily   Chronic pain due to thoracic compression fracture - pain stable on tylenol 650 mg three times daily   Vascular dementia - currently not on meds for cognition. Albumin 3.2. Weight stable at 110 lbs  Past Medical History:  Diagnosis Date  . Cataract   . Depression   . Diverticulosis   . GERD (gastroesophageal reflux disease)   . Hyperlipidemia   . Hypertension   . Osteoporosis   . Stasis dermatitis   . Thyroid disease    hypothyroid    Past Surgical History:  Procedure Laterality Date  . ABDOMINAL HYSTERECTOMY    . APPENDECTOMY    . BREAST SURGERY  biopsy  . CATARACT EXTRACTION    . CHOLECYSTECTOMY    . TONSILECTOMY/ADENOIDECTOMY WITH MYRINGOTOMY    . TUBAL LIGATION    . vertebrolplasty      Patient Care Team: Drake Leach, MD as PCP - General (Internal Medicine)  Social History   Social History  . Marital status: Widowed    Spouse name: N/A  . Number of children: N/A  . Years of education: N/A   Occupational History  . Not on file.   Social History Main Topics  . Smoking status: Former Research scientist (life sciences)  . Smokeless  tobacco: Not on file  . Alcohol use Yes  . Drug use: Unknown  . Sexual activity: Not on file   Other Topics Concern  . Not on file   Social History Narrative  . No narrative on file     reports that she has quit smoking. She does not have any smokeless tobacco history on file. She reports that she drinks alcohol. Her drug history is not on file.  Family History  Problem Relation Age of Onset  . Aortic aneurysm Mother   . Cancer Father     mouth  . Diabetes Neg Hx   . Coronary artery disease Neg Hx    Family Status  Relation Status  . Mother Deceased  . Father Deceased  . Neg Hx     Immunization History  Administered Date(s) Administered  . Influenza-Unspecified 07/26/2016  . PPD Test 08/10/2015    Allergies  Allergen Reactions  . Ciprofloxacin     unknown  . Effexor [Venlafaxine]     Intolerance  . Enablex [Darifenacin Hydrobromide Er]     unknown  . Macrobid [employer] Macro]     unknown  . Metronidazole And Related     unknown  . Nsaids     unknown  . Reclast [Zoledronic Acid]     unknown  . Sulfa Antibiotics     unknown    Medications: Patient's Medications  New Prescriptions   No medications on file  Previous Medications   ACETAMINOPHEN (TYLENOL) 325 MG TABLET    Take 650 mg by mouth every 8 (eight) hours.   ALUMINUM-MAGNESIUM HYDROXIDE-SIMETHICONE (MAALOX) 737-106-26 MG/5ML SUSP    Take 15 mLs by mouth every 4 (four) hours as needed (reflux).   BENZOCAINE (ANBESOL) 10 % MUCOSAL GEL    Use as directed 1 application in the mouth or throat every 8 (eight) hours as needed for mouth pain.    BENZONATATE (TESSALON) 100 MG CAPSULE    Take 100 mg by mouth 3 (three) times daily as needed for cough.   BISACODYL (DULCOLAX) 10 MG SUPPOSITORY    Place 10 mg rectally as needed for moderate constipation.   BRIMONIDINE (ALPHAGAN) 0.2 % OPHTHALMIC SOLUTION    Place 1 drop into the right eye 3 (three) times daily.   CALCIUM CARBONATE (OS-CAL - DOSED  IN MG OF ELEMENTAL CALCIUM) 1250 (500 CA) MG TABLET    Take 1 tablet by mouth daily with breakfast.   CITALOPRAM (CELEXA) 20 MG TABLET    Take 20 mg by mouth daily.    CLONAZEPAM (KLONOPIN) 0.5 MG TABLET    Take 0.5 mg by mouth daily as needed for anxiety.    DOCUSATE SODIUM (COLACE) 100 MG CAPSULE    Take 200 mg by mouth every morning.   DORZOLAMIDE-TIMOLOL (COSOPT) 22.3-6.8 MG/ML OPHTHALMIC SOLUTION    Place 1 drop into both eyes 2 (two) times daily.   FOOD THICKENER (THICK  IT) POWD    Take 1 Container by mouth as needed.   GUAIFENESIN (MUCINEX) 600 MG 12 HR TABLET    Take 600 mg by mouth every 12 (twelve) hours as needed (congestion).   HYDROCORTISONE (ANUSOL-HC) 2.5 % RECTAL CREAM    Place 1 application rectally 2 (two) times daily as needed (itching, hemorrhoids.).    HYPROMELLOSE (ARTIFICIAL TEARS) 0.4 % SOLN    Apply 1 drop to eye 4 (four) times daily.   IPRATROPIUM-ALBUTEROL (DUONEB) 0.5-2.5 (3) MG/3ML SOLN    Take 3 mLs by nebulization 3 (three) times daily. For PNA.   LEVOTHYROXINE (SYNTHROID, LEVOTHROID) 25 MCG TABLET    Take 12.5 mcg by mouth daily before breakfast.   MAGNESIUM HYDROXIDE (MILK OF MAGNESIA) 400 MG/5ML SUSPENSION    Take 30 mLs by mouth. Every 72 hours as needed   MELATONIN PO    Take 6 mg by mouth at bedtime.   MULTIPLE VITAMIN (MULTIVITAMIN WITH MINERALS) TABS TABLET    Take 1 tablet by mouth daily.   OSTOMY SUPPLIES (SKIN PREP WIPES) MISC    1 application by Does not apply route daily. Apply topically to R and L heel every day shift for preventative. Ensure feet are floating with pillow.   OXYCODONE HCL, ABUSE DETER, (OXAYDO) 5 MG TABA    Take 2.5 mg by mouth every 4 (four) hours as needed.   OXYGEN    Inhale 2 L/min into the lungs as needed (low o2 levels).   PREDNISOLONE ACETATE (PRED FORTE) 1 % OPHTHALMIC SUSPENSION    Place 1 drop into the left eye 2 (two) times daily.    QUETIAPINE (SEROQUEL) 100 MG TABLET    Take 100 mg by mouth at bedtime. Give with 50 mg to  equal total dose of 150 mg.   QUETIAPINE (SEROQUEL) 25 MG TABLET    Take 50 mg by mouth at bedtime. Give at bedtime with 100 mg quetiapine to equal 150 mg.   RANITIDINE (ZANTAC) 300 MG TABLET    Take 300 mg by mouth at bedtime.   VITAMIN B-12 (CYANOCOBALAMIN) 1000 MCG TABLET    Take 1,000 mcg by mouth daily.  Modified Medications   No medications on file  Discontinued Medications   No medications on file    Review of Systems  Unable to perform ROS: Dementia    Vitals:   10/23/16 0910  BP: 116/64  Pulse: 76  Resp: 20  Temp: 98.2 F (36.8 C)  TempSrc: Oral  SpO2: 96%  Weight: 110 lb (49.9 kg)  Height: 4' 11" (1.499 m)   Body mass index is 22.22 kg/m.  Physical Exam  Constitutional: She appears well-developed.  Frail appearing sitting in bed in NAD  HENT:  Mouth/Throat: Oropharynx is clear and moist. No oropharyngeal exudate.  MMM; no oral thrush  Eyes: Pupils are equal, round, and reactive to light. No scleral icterus.  Neck: Neck supple. Carotid bruit is not present. No tracheal deviation present.  Cardiovascular: Normal rate, regular rhythm and intact distal pulses.  Exam reveals no gallop and no friction rub.   Murmur (1/6 SEM) heard. No LE edema b/l. no calf TTP.   Pulmonary/Chest: Effort normal. No stridor. No respiratory distress. She has no wheezes. She has rhonchi. She has rales (L>R inspiratory).  Abdominal: Soft. Bowel sounds are normal. She exhibits distension. She exhibits no mass. There is no hepatomegaly. There is tenderness. There is no rebound and no guarding.  Musculoskeletal: She exhibits edema and tenderness (thoracic spinous process).  RLE immobilizer intact  Lymphadenopathy:    She has no cervical adenopathy.  Neurological: She is alert.  Skin: Skin is warm and dry. No rash noted.  Psychiatric: She has a normal mood and affect. Her behavior is normal.     Labs reviewed: Admission on 10/10/2016, Discharged on 10/12/2016  Component Date Value Ref  Range Status  . Lactic Acid, Venous 10/10/2016 2.35* 0.5 - 1.9 mmol/L Final  . Comment 10/10/2016 NOTIFIED PHYSICIAN   Final  . Sodium 10/10/2016 149* 135 - 145 mmol/L Final  . Potassium 10/10/2016 4.3  3.5 - 5.1 mmol/L Final  . Chloride 10/10/2016 111  101 - 111 mmol/L Final  . CO2 10/10/2016 27  22 - 32 mmol/L Final  . Glucose, Bld 10/10/2016 147* 65 - 99 mg/dL Final  . BUN 10/10/2016 56* 6 - 20 mg/dL Final  . Creatinine, Ser 10/10/2016 1.41* 0.44 - 1.00 mg/dL Final  . Calcium 10/10/2016 9.6  8.9 - 10.3 mg/dL Final  . Total Protein 10/10/2016 7.9  6.5 - 8.1 g/dL Final  . Albumin 10/10/2016 3.2* 3.5 - 5.0 g/dL Final  . AST 10/10/2016 19  15 - 41 U/L Final  . ALT 10/10/2016 11* 14 - 54 U/L Final  . Alkaline Phosphatase 10/10/2016 82  38 - 126 U/L Final  . Total Bilirubin 10/10/2016 0.8  0.3 - 1.2 mg/dL Final  . GFR calc non Af Amer 10/10/2016 33* >60 mL/min Final  . GFR calc Af Amer 10/10/2016 38* >60 mL/min Final   Comment: (NOTE) The eGFR has been calculated using the CKD EPI equation. This calculation has not been validated in all clinical situations. eGFR's persistently <60 mL/min signify possible Chronic Kidney Disease.   . Anion gap 10/10/2016 11  5 - 15 Final  . WBC 10/10/2016 19.9* 4.0 - 10.5 K/uL Final  . RBC 10/10/2016 3.68* 3.87 - 5.11 MIL/uL Final  . Hemoglobin 10/10/2016 11.5* 12.0 - 15.0 g/dL Final  . HCT 10/10/2016 34.9* 36.0 - 46.0 % Final  . MCV 10/10/2016 94.8  78.0 - 100.0 fL Final  . MCH 10/10/2016 31.3  26.0 - 34.0 pg Final  . MCHC 10/10/2016 33.0  30.0 - 36.0 g/dL Final  . RDW 10/10/2016 14.3  11.5 - 15.5 % Final  . Platelets 10/10/2016 338  150 - 400 K/uL Final  . Neutrophils Relative % 10/10/2016 90  % Final  . Lymphocytes Relative 10/10/2016 4  % Final  . Monocytes Relative 10/10/2016 6  % Final  . Eosinophils Relative 10/10/2016 0  % Final  . Basophils Relative 10/10/2016 0  % Final  . Neutro Abs 10/10/2016 17.9* 1.7 - 7.7 K/uL Final  . Lymphs Abs  10/10/2016 0.8  0.7 - 4.0 K/uL Final  . Monocytes Absolute 10/10/2016 1.2* 0.1 - 1.0 K/uL Final  . Eosinophils Absolute 10/10/2016 0.0  0.0 - 0.7 K/uL Final  . Basophils Absolute 10/10/2016 0.0  0.0 - 0.1 K/uL Final  . WBC Morphology 10/10/2016 INCREASED BANDS (>20% BANDS)   Final  . Color, Urine 10/10/2016 AMBER* YELLOW Final  . APPearance 10/10/2016 CLOUDY* CLEAR Final  . Specific Gravity, Urine 10/10/2016 1.025  1.005 - 1.030 Final  . pH 10/10/2016 8.0  5.0 - 8.0 Final  . Glucose, UA 10/10/2016 NEGATIVE  NEGATIVE mg/dL Final  . Hgb urine dipstick 10/10/2016 NEGATIVE  NEGATIVE Final  . Bilirubin Urine 10/10/2016 NEGATIVE  NEGATIVE Final  . Ketones, ur 10/10/2016 NEGATIVE  NEGATIVE mg/dL Final  . Protein, ur 10/10/2016 100* NEGATIVE mg/dL Final  .  Nitrite 10/10/2016 NEGATIVE  NEGATIVE Final  . Leukocytes, UA 10/10/2016 LARGE* NEGATIVE Final  . RBC / HPF 10/10/2016 0-5  0 - 5 RBC/hpf Final  . WBC, UA 10/10/2016 TOO NUMEROUS TO COUNT  0 - 5 WBC/hpf Final  . Bacteria, UA 10/10/2016 RARE* NONE SEEN Final  . Squamous Epithelial / LPF 10/10/2016 0-5* NONE SEEN Final  . Mucous 10/10/2016 PRESENT   Final  . Hyaline Casts, UA 10/10/2016 PRESENT   Final  . Non Squamous Epithelial 10/10/2016 0-5* NONE SEEN Final  . Specimen Description 10/10/2016 URINE, RANDOM   Final  . Special Requests 10/10/2016 NONE   Final  . Culture 10/10/2016 >=100,000 COLONIES/mL PROTEUS MIRABILIS*  Final  . Report Status 10/10/2016 10/12/2016 FINAL   Final  . Organism ID, Bacteria 10/10/2016 PROTEUS MIRABILIS*  Final  . Specimen Description 10/10/2016 BLOOD BLOOD LEFT FOREARM   Final  . Special Requests 10/10/2016 BOTTLES DRAWN AEROBIC AND ANAEROBIC 10CC   Final  . Culture 10/10/2016    Final                   Value:NO GROWTH 5 DAYS Performed at [facility]   . Report Status 10/10/2016 10/15/2016 FINAL   Final  . Specimen Description 10/10/2016 BLOOD RIGHT HAND   Final  . Special Requests 10/10/2016 BOTTLES  DRAWN AEROBIC AND ANAEROBIC 5CC   Final  . Culture 10/10/2016    Final                   Value:NO GROWTH 5 DAYS Performed at [facility]   . Report Status 10/10/2016 10/15/2016 FINAL   Final  . Troponin i, poc 10/10/2016 0.04  0.00 - 0.08 ng/mL Final  . Comment 3 10/10/2016          Final   Comment: Due to the release kinetics of cTnI, a negative result within the first hours of the onset of symptoms does not rule out myocardial infarction with certainty. If myocardial infarction is still suspected, repeat the test at appropriate intervals.   . Lactic Acid, Venous 10/10/2016 2.27* 0.5 - 1.9 mmol/L Final  . Comment 10/10/2016 NOTIFIED PHYSICIAN   Final  . Lactic Acid, Venous 10/10/2016 1.8  0.5 - 1.9 mmol/L Final  . HIV Screen 4th Generation wRfx 10/10/2016 Non Reactive  Non Reactive Final   Comment: (NOTE) Performed At: [facility] [place], [place] [id number] Lindon Romp MD WU:[id number]   . Specimen Description 10/10/2016 BLOOD LEFT HAND   Final  . Special Requests 10/10/2016 BOTTLES DRAWN AEROBIC ONLY 5CC   Final  . Culture 10/10/2016    Final                   Value:NO GROWTH 5 DAYS Performed at [facility]   . Report Status 10/10/2016 10/15/2016 FINAL   Final  . Specimen Description 10/10/2016 BLOOD LEFT ARM   Final  . Special Requests 10/10/2016 BOTTLES DRAWN AEROBIC ONLY 5CC   Final  . Culture 10/10/2016    Final                   Value:NO GROWTH 5 DAYS Performed at [facility]   . Report Status 10/10/2016 10/15/2016 FINAL   Final  . Strep Pneumo Urinary Antigen 10/10/2016 NEGATIVE  NEGATIVE Final   Comment:        Infection due to S. pneumoniae cannot be absolutely ruled out since the antigen present may be  below the detection limit of the test. Performed at [facility]   . WBC 10/10/2016 18.0* 4.0 - 10.5 K/uL Final  . RBC 10/10/2016 3.38* 3.87 - 5.11 MIL/uL Final  . Hemoglobin 10/10/2016 10.3* 12.0 -  15.0 g/dL Final  . HCT 10/10/2016 33.0* 36.0 - 46.0 % Final  . MCV 10/10/2016 97.6  78.0 - 100.0 fL Final  . MCH 10/10/2016 30.5  26.0 - 34.0 pg Final  . MCHC 10/10/2016 31.2  30.0 - 36.0 g/dL Final  . RDW 10/10/2016 14.6  11.5 - 15.5 % Final  . Platelets 10/10/2016 336  150 - 400 K/uL Final  . Creatinine, Ser 10/10/2016 1.07* 0.44 - 1.00 mg/dL Final  . GFR calc non Af Amer 10/10/2016 46* >60 mL/min Final  . GFR calc Af Amer 10/10/2016 53* >60 mL/min Final   Comment: (NOTE) The eGFR has been calculated using the CKD EPI equation. This calculation has not been validated in all clinical situations. eGFR's persistently <60 mL/min signify possible Chronic Kidney Disease.   . L. pneumophila Serogp 1 Ur Ag 10/11/2016 Negative  Negative Final   Comment: (NOTE) Presumptive negative for L. pneumophila serogroup 1 antigen in urine, suggesting no recent or current infection. Legionnaires' disease cannot be ruled out since other serogroups and species may also cause disease. Performed At: [facility] [place], [place] [id number] Lindon Romp MD VZ:[id number]   . Source of Sample 10/11/2016 URINE, RANDOM   Final  . Sodium 10/11/2016 143  135 - 145 mmol/L Final  . Potassium 10/11/2016 3.5  3.5 - 5.1 mmol/L Final  . Chloride 10/11/2016 113* 101 - 111 mmol/L Final  . CO2 10/11/2016 25  22 - 32 mmol/L Final  . Glucose, Bld 10/11/2016 97  65 - 99 mg/dL Final  . BUN 10/11/2016 47* 6 - 20 mg/dL Final  . Creatinine, Ser 10/11/2016 0.83  0.44 - 1.00 mg/dL Final  . Calcium 10/11/2016 8.3* 8.9 - 10.3 mg/dL Final  . GFR calc non Af Amer 10/11/2016 >60  >60 mL/min Final  . GFR calc Af Amer 10/11/2016 >60  >60 mL/min Final   Comment: (NOTE) The eGFR has been calculated using the CKD EPI equation. This calculation has not been validated in all clinical situations. eGFR's persistently <60 mL/min signify possible Chronic Kidney Disease.   . Anion gap 10/11/2016 5  5 - 15 Final   . WBC 10/11/2016 14.2* 4.0 - 10.5 K/uL Final  . RBC 10/11/2016 3.04* 3.87 - 5.11 MIL/uL Final  . Hemoglobin 10/11/2016 9.0* 12.0 - 15.0 g/dL Final  . HCT 10/11/2016 29.5* 36.0 - 46.0 % Final  . MCV 10/11/2016 97.0  78.0 - 100.0 fL Final  . MCH 10/11/2016 29.6  26.0 - 34.0 pg Final  . MCHC 10/11/2016 30.5  30.0 - 36.0 g/dL Final  . RDW 10/11/2016 14.6  11.5 - 15.5 % Final  . Platelets 10/11/2016 314  150 - 400 K/uL Final  . MRSA by PCR 10/10/2016 POSITIVE* NEGATIVE Final   Comment:        The GeneXpert MRSA Assay (FDA approved for NASAL specimens only), is one component of a comprehensive MRSA colonization surveillance program. It is not intended to diagnose MRSA infection nor to guide or monitor treatment for MRSA infections. RESULT CALLED TO, READ BACK BY AND VERIFIED WITH: Rex Kras [id number] @ [address] [place]   . Troponin I 10/11/2016 0.09* <0.03 ng/mL Final   Comment: CRITICAL RESULT CALLED TO, READ BACK BY AND  VERIFIED WITH: V.HERBST RN [id number].[id number]   . Troponin I 10/11/2016 0.10* <0.03 ng/mL Final  . Troponin I 10/12/2016 0.10* <0.03 ng/mL Final  . Troponin I 10/12/2016 0.08* <0.03 ng/mL Final    Dg Chest 2 View  Result Date: 10/10/2016 CLINICAL DATA:  Cough, fever EXAM: CHEST  2 VIEW COMPARISON:  08/06/2015 FINDINGS: Consolidation noted in the left lower lobe compatible with pneumonia. No confluent opacity on the right. Heart is normal size. No visible effusions or acute bony abnormality. Changes of prior multi level vertebroplasty in the mid to lower thoracic spine. IMPRESSION: Consolidation in the left lower lobe compatible with pneumonia. Followup PA and lateral chest X-ray is recommended in 3-4 weeks following trial of antibiotic therapy to ensure resolution and exclude underlying malignancy. Electronically Signed   By: Rolm Baptise M.D.   On: 10/10/2016 13:33   Dg Tibia/fibula Right  Result Date: 10/10/2016 CLINICAL DATA:  Onset right lower leg pain  today. No known injury. Initial encounter. EXAM: RIGHT TIBIA AND FIBULA - 2 VIEW COMPARISON:  None. FINDINGS: There is no evidence of fracture or other focal bone lesions. Bones are osteopenic. Soft tissues are unremarkable. IMPRESSION: No acute abnormality. Osteopenia. Electronically Signed   By: Inge Rise M.D.   On: 10/10/2016 18:00   Dg Ankle Complete Right  Result Date: 10/10/2016 CLINICAL DATA:  Onset right ankle pain today. No known injury. Initial encounter. EXAM: RIGHT ANKLE - COMPLETE 3+ VIEW COMPARISON:  None. FINDINGS: No acute bony or joint abnormality is seen. Bones are osteopenic. No notable degenerative disease. Soft tissues are unremarkable. IMPRESSION: Osteopenia.  No acute abnormality. Electronically Signed   By: Inge Rise M.D.   On: 10/10/2016 18:00   Dg Knee Complete 4 Views Right  Result Date: 10/10/2016 CLINICAL DATA:  Onset right knee pain today. No known injury. Initial encounter. EXAM: RIGHT KNEE - COMPLETE 4+ VIEW COMPARISON:  None. FINDINGS: There is a nondisplaced fracture through the medial epicondyle of the femur. No other fracture is identified. Bones are markedly osteopenic. Small joint effusion noted. IMPRESSION: Acute nondisplaced fracture medial epicondyle of the right femur appears incomplete. Osteopenia. Small effusion. Electronically Signed   By: Inge Rise M.D.   On: 10/10/2016 18:02   Dg Foot Complete Right  Result Date: 10/10/2016 CLINICAL DATA:  Onset right foot pain today. No known injury. Initial encounter. EXAM: RIGHT FOOT COMPLETE - 3+ VIEW COMPARISON:  None. FINDINGS: No acute bony or joint abnormality is identified. No notable arthropathy. Bones are markedly osteopenic. IMPRESSION: No acute abnormality. Osteopenia. Electronically Signed   By: Inge Rise M.D.   On: 10/10/2016 18:02     Assessment/Plan   ICD-9-CM ICD-10-CM   1. Hypothyroidism due to acquired atrophy of thyroid 244.8 E03.4    246.8       2. Closed  nondisplaced supracondylar fracture of distal end of right femur without intracondylar extension with routine healing, subsequent encounter V54.15 S72.454D   3. Chronic respiratory failure with hypoxia (HCC) 518.83 J96.11    799.02    4. Depression, major, recurrent, severe with psychosis ([place]) 296.34 F33.3   5. Paranoid schizophrenia, chronic condition ([place]) 295.32 F20.0   6. Vascular dementia without behavioral disturbance 290.40 F01.50   7. Chronic pain syndrome 338.4 G89.4   8. HCAP (healthcare-associated pneumonia) 62 J18.9    clinically improving    Check TSH today and adjust synthroid accordingly  cont other meds as ordered  Repeat CXR in 4 weeks to follow resolution of HCAP  f/u with Ortho as scheduled. Will need knee immobilizer 6-8 weeks  PT/OT/ST as ordered  Cont nutritional supplements as ordered  Psych services to follow  GOAL: short term rehab then continue long term care. Communicated with pt and nursing.  Will follow  [place] Jakubiak S. Perlie Gold  [facility] [address] [place], [place] [address] [phone] Cell (Monday-Friday 8 AM - 5 PM) [phone] After 5 PM and follow prompts

## 2017-01-09 ENCOUNTER — Non-Acute Institutional Stay (SKILLED_NURSING_FACILITY): Payer: Medicare Other | Admitting: Internal Medicine

## 2017-01-09 ENCOUNTER — Encounter: Payer: Self-pay | Admitting: Internal Medicine

## 2017-01-09 DIAGNOSIS — Z9189 Other specified personal risk factors, not elsewhere classified: Secondary | ICD-10-CM

## 2017-01-09 DIAGNOSIS — F0150 Vascular dementia without behavioral disturbance: Secondary | ICD-10-CM

## 2017-01-09 DIAGNOSIS — R1314 Dysphagia, pharyngoesophageal phase: Secondary | ICD-10-CM

## 2017-01-09 DIAGNOSIS — E039 Hypothyroidism, unspecified: Secondary | ICD-10-CM

## 2017-01-09 DIAGNOSIS — I10 Essential (primary) hypertension: Secondary | ICD-10-CM | POA: Diagnosis not present

## 2017-01-09 NOTE — Assessment & Plan Note (Addendum)
01/09/17 able to identify the year and the president but not the month and day of the month  No change unless Psych NP directs

## 2017-01-09 NOTE — Patient Instructions (Signed)
See assessment and plan under each diagnosis in the problem list and acutely for this visit 

## 2017-01-09 NOTE — Assessment & Plan Note (Signed)
PPI daily CBC

## 2017-01-09 NOTE — Progress Notes (Signed)
Patient ID: Ashley Swanson, female   DOB: [birth year], 83 y.o.   MRN: [id number]   This is a nursing facility follow up of chronic medical diagnoses  Interim medical record and care since last [facility] visit was updated with review of diagnostic studies and change in clinical status since last visit were documented.  HPI: She hyperfocuses on being placed in a wheelchair and left for more than 4 hours or being left in one position after  being turned in bed. She describes pain from her knees down, she was unable to qualify her quantitate the pain. Her shortness of breath is chronic and stable. She has occasional sputum production. She also describes indigestion with some intermittent dysphagia despite Ranitidine qhs. Anxiety is a chronic problem. Last labs on record 10/16/16; chemistries were normal. She has had a chronic anemia which has been slightly progressive since 06/28/16. At that time hemoglobin was 11.9. On 10/01/16 hemoglobin was 9 and hematocrit 29.5. The anemia was normochromic, normocytic. TSH has been therapeutic but had dropped significantly from 06/28/16-08/28/16.  Medical /surgical history includes essential hypertension, chronic respiratory insufficiency with hypoxia, GERD with dysphagia, hypothyroidism, vascular dementia without behavioral disturbance, osteoporosis with thoracic compression fractures and depression with psychosis. She is on Seroquel 125 mg daily.. There is also diagnosis of paranoid schizophrenia.  Surgeries include breast biopsy and abdominal hysterectomy Family history is noncontributory in this 83 year old female.  Review of systems:  Constitutional: No fever,significant weight change  Eyes: No discharge, pain, vision change Cardiovascular: No chest pain, palpitations,paroxysmal nocturnal dyspnea, claudication, edema  Respiratory: No hemoptysis Gastrointestinal: No abdominal pain, nausea / vomiting,rectal bleeding, melena,change in  bowels Genitourinary: No dysuria,hematuria, pyuria,  incontinence, nocturia Dermatologic: No rash, pruritus, change in appearance of skin Allergy/immunology: No itchy/ watery eyes, significant sneezing, urticaria, angioedema  Physical exam:  Pertinent or positive findings: He was slowly able to state the year & the name of the president but she is very anxious clinically. There is slight ptosis of the right eye. There is slight erythema of the eyelids without clinical evidence of conjunctivitis. She wears only the upper plate. Breath sounds decreased. Rhythm is slightly irregular. Bowel sounds are hyperactive. She has severe mixed arthritic changes in the hands with some digit deviation. Valgus deformity of the knees is suggested, no effusion was present. Posterior tibial pulses are decreased. General appearance:no acute distress , increased work of breathing is present.   Lymphatic: No lymphadenopathy about the head, neck, axilla . Eyes: No conjunctival inflammation or lid edema is present. There is no scleral icterus. Ears:  External ear exam shows no significant lesions or deformities.   Nose:  External nasal examination shows no deformity or inflammation. Nasal mucosa are pink and moist without lesions ,exudates Oral exam: lips and gums are healthy appearing.There is no oropharyngeal erythema or exudate . Neck:  No thyromegaly, masses, tenderness noted.    Heart:  Normal rate rhythm. S1 and S2 normal without gallop, murmur, click, rub .  Lungs:Chest clear to auscultation without wheezes, rhonchi,rales , rubs. Abdomen: Abdomen is soft and nontender with no organomegaly, hernias,masses. GU: deferred  Extremities:  No cyanosis, clubbing,edema  Neurologic exam : Balance,Rhomberg,finger to nose testing could not be completed due to clinical state Skin: Warm & dry w/o tenting. No significant lesions or rash.  See summary under each active problem in the Problem List with associated updated  therapeutic plan as well as for each new diagnosis @ this visit

## 2017-01-09 NOTE — Assessment & Plan Note (Signed)
BP controlled & BMET normal; no antihypertensive medication indicated

## 2017-01-09 NOTE — Assessment & Plan Note (Signed)
TSH 

## 2017-01-09 NOTE — Assessment & Plan Note (Signed)
Attempt wean of oxycodone

## 2017-01-10 LAB — CBC AND DIFFERENTIAL
HEMATOCRIT: 38 % (ref 36–46)
Hemoglobin: 11.8 g/dL — AB (ref 12.0–16.0)
Neutrophils Absolute: 5 /uL
Platelets: 199 10*3/uL (ref 150–399)
WBC: 8.1 10*3/mL

## 2017-01-15 ENCOUNTER — Other Ambulatory Visit: Payer: Self-pay | Admitting: *Deleted

## 2017-01-15 MED ORDER — OXYCODONE HCL 5 MG PO TABS: ORAL_TABLET | ORAL | 0 refills | Status: AC

## 2017-01-15 NOTE — Telephone Encounter (Signed)
[facility] #: [phone] Fax#: [phone]  

## 2017-03-12 ENCOUNTER — Non-Acute Institutional Stay (SKILLED_NURSING_FACILITY): Payer: Medicare Other

## 2017-03-12 DIAGNOSIS — Z0000 Encounter for general adult medical examination without abnormal findings: Secondary | ICD-10-CM

## 2017-03-12 NOTE — Patient Instructions (Signed)
[name] , Thank you for taking time to come for your Medicare Wellness Visit. I appreciate your ongoing commitment to your health goals. Please review the following plan we discussed and let me know if I can assist you in the future.   Screening recommendations/referrals: Colonoscopy- long term pt Mammogram- long term pt Bone Density due Recommended yearly ophthalmology/optometry visit for glaucoma screening and checkup Recommended yearly dental visit for hygiene and checkup  Vaccinations: Influenza vaccine due 07/26/2017 Pneumococcal vaccine 13 due Tdap vaccine not in records Shingles vaccine not in records  Advanced directives: DNR in chart  Conditions/risks identified: None  Next appointment: none upcoming   Preventive Care 65 Years and Older, Female Preventive care refers to lifestyle choices and visits with your health care provider that can promote health and wellness. What does preventive care include?  A yearly physical exam. This is also called an annual well check.  Dental exams once or twice a year.  Routine eye exams. Ask your health care provider how often you should have your eyes checked.  Personal lifestyle choices, including:  Daily care of your teeth and gums.  Regular physical activity.  Eating a healthy diet.  Avoiding tobacco and drug use.  Limiting alcohol use.  Practicing safe sex.  Taking low-dose aspirin every day.  Taking vitamin and mineral supplements as recommended by your health care provider. What happens during an annual well check? The services and screenings done by your health care provider during your annual well check will depend on your age, overall health, lifestyle risk factors, and family history of disease. Counseling  Your health care provider may ask you questions about your:  Alcohol use.  Tobacco use.  Drug use.  Emotional well-being.  Home and relationship well-being.  Sexual activity.  Eating  habits.  History of falls.  Memory and ability to understand (cognition).  Work and work Astronomerenvironment.  Reproductive health. Screening  You may have the following tests or measurements:  Height, weight, and BMI.  Blood pressure.  Lipid and cholesterol levels. These may be checked every 5 years, or more frequently if you are over 81 years old.  Skin check.  Lung cancer screening. You may have this screening every year starting at age 81 if you have a 30-pack-year history of smoking and currently smoke or have quit within the past 15 years.  Fecal occult blood test (FOBT) of the stool. You may have this test every year starting at age 81.  Flexible sigmoidoscopy or colonoscopy. You may have a sigmoidoscopy every 5 years or a colonoscopy every 10 years starting at age 81.  Hepatitis C blood test.  Hepatitis B blood test.  Sexually transmitted disease (STD) testing.  Diabetes screening. This is done by checking your blood sugar (glucose) after you have not eaten for a while (fasting). You may have this done every 1-3 years.  Bone density scan. This is done to screen for osteoporosis. You may have this done starting at age 81.  Mammogram. This may be done every 1-2 years. Talk to your health care provider about how often you should have regular mammograms. Talk with your health care provider about your test results, treatment options, and if necessary, the need for more tests. Vaccines  Your health care provider may recommend certain vaccines, such as:  Influenza vaccine. This is recommended every year.  Tetanus, diphtheria, and acellular pertussis (Tdap, Td) vaccine. You may need a Td booster every 10 years.  Zoster vaccine. You may need  this after age 44.  Pneumococcal 13-valent conjugate (PCV13) vaccine. One dose is recommended after age 69.  Pneumococcal polysaccharide (PPSV23) vaccine. One dose is recommended after age 74. Talk to your health care provider about which  screenings and vaccines you need and how often you need them. This information is not intended to replace advice given to you by your health care provider. Make sure you discuss any questions you have with your health care provider. Document Released: 10/14/2015 Document Revised: 06/06/2016 Document Reviewed: 07/19/2015 Elsevier Interactive Patient Education  2017 [place] Prevention in the Home Falls can cause injuries. They can happen to people of all ages. There are many things you can do to make your home safe and to help prevent falls. What can I do on the outside of my home?  Regularly fix the edges of walkways and driveways and fix any cracks.  Remove anything that might make you trip as you walk through a door, such as a raised step or threshold.  Trim any bushes or trees on the path to your home.  Use bright outdoor lighting.  Clear any walking paths of anything that might make someone trip, such as rocks or tools.  Regularly check to see if handrails are loose or broken. Make sure that both sides of any steps have handrails.  Any raised decks and porches should have guardrails on the edges.  Have any leaves, snow, or ice cleared regularly.  Use sand or salt on walking paths during winter.  Clean up any spills in your garage right away. This includes oil or grease spills. What can I do in the bathroom?  Use night lights.  Install grab bars by the toilet and in the tub and shower. Do not use towel bars as grab bars.  Use non-skid mats or decals in the tub or shower.  If you need to sit down in the shower, use a plastic, non-slip stool.  Keep the floor dry. Clean up any water that spills on the floor as soon as it happens.  Remove soap buildup in the tub or shower regularly.  Attach bath mats securely with double-sided non-slip rug tape.  Do not have throw rugs and other things on the floor that can make you trip. What can I do in the bedroom?  Use  night lights.  Make sure that you have a light by your bed that is easy to reach.  Do not use any sheets or blankets that are too big for your bed. They should not hang down onto the floor.  Have a firm chair that has side arms. You can use this for support while you get dressed.  Do not have throw rugs and other things on the floor that can make you trip. What can I do in the kitchen?  Clean up any spills right away.  Avoid walking on wet floors.  Keep items that you use a lot in easy-to-reach places.  If you need to reach something above you, use a strong step stool that has a grab bar.  Keep electrical cords out of the way.  Do not use floor polish or wax that makes floors slippery. If you must use wax, use non-skid floor wax.  Do not have throw rugs and other things on the floor that can make you trip. What can I do with my stairs?  Do not leave any items on the stairs.  Make sure that there are handrails on both sides of  the stairs and use them. Fix handrails that are broken or loose. Make sure that handrails are as long as the stairways.  Check any carpeting to make sure that it is firmly attached to the stairs. Fix any carpet that is loose or worn.  Avoid having throw rugs at the top or bottom of the stairs. If you do have throw rugs, attach them to the floor with carpet tape.  Make sure that you have a light switch at the top of the stairs and the bottom of the stairs. If you do not have them, ask someone to add them for you. What else can I do to help prevent falls?  Wear shoes that:  Do not have high heels.  Have rubber bottoms.  Are comfortable and fit you well.  Are closed at the toe. Do not wear sandals.  If you use a stepladder:  Make sure that it is fully opened. Do not climb a closed stepladder.  Make sure that both sides of the stepladder are locked into place.  Ask someone to hold it for you, if possible.  Clearly mark and make sure that you  can see:  Any grab bars or handrails.  First and last steps.  Where the edge of each step is.  Use tools that help you move around (mobility aids) if they are needed. These include:  Canes.  Walkers.  Scooters.  Crutches.  Turn on the lights when you go into a dark area. Replace any light bulbs as soon as they burn out.  Set up your furniture so you have a clear path. Avoid moving your furniture around.  If any of your floors are uneven, fix them.  If there are any pets around you, be aware of where they are.  Review your medicines with your doctor. Some medicines can make you feel dizzy. This can increase your chance of falling. Ask your doctor what other things that you can do to help prevent falls. This information is not intended to replace advice given to you by your health care provider. Make sure you discuss any questions you have with your health care provider. Document Released: 07/14/2009 Document Revised: 02/23/2016 Document Reviewed: 10/22/2014 Elsevier Interactive Patient Education  2017 [employer].

## 2017-03-12 NOTE — Progress Notes (Signed)
Subjective:   Ashley Swanson is a 83 y.o. female who presents for an Initial Medicare Annual Wellness Visit [facility]     Objective:    Today's Vitals   03/12/17 1020  BP: 121/70  Pulse: 73  Temp: 98.5 F (36.9 C)  TempSrc: Oral  SpO2: 95%  Weight: 110 lb (49.9 kg)  Height: 4\' 11"  (1.499 m)   Body mass index is 22.22 kg/m.   Current Medications (verified) Outpatient Encounter Prescriptions as of 03/12/2017  Medication Sig  . acetaminophen (TYLENOL) 325 MG tablet Take 650 mg by mouth every 8 (eight) hours.  Marland Kitchen. aluminum-magnesium hydroxide-simethicone (MAALOX) 200-200-20 MG/5ML SUSP Take 15 mLs by mouth every 4 (four) hours as needed (reflux).  . benzocaine (ANBESOL) 10 % mucosal gel Use as directed 1 application in the mouth or throat every 8 (eight) hours as needed for mouth pain.   . benzonatate (TESSALON) 100 MG capsule Take 100 mg by mouth 3 (three) times daily as needed for cough.  . bisacodyl (DULCOLAX) 10 MG suppository Place 10 mg rectally as needed for moderate constipation.  . brimonidine (ALPHAGAN) 0.2 % ophthalmic solution Place 1 drop into the right eye 3 (three) times daily.  . calcium carbonate (OS-CAL - DOSED IN MG OF ELEMENTAL CALCIUM) 1250 (500 CA) MG tablet Take 1 tablet by mouth daily with breakfast.  . citalopram (CELEXA) 20 MG tablet Take 20 mg by mouth daily.   Marland Kitchen. docusate sodium (COLACE) 100 MG capsule Take 200 mg by mouth every morning.  . dorzolamide-timolol (COSOPT) 22.3-6.8 MG/ML ophthalmic solution Place 1 drop into both eyes 2 (two) times daily.  . ergocalciferol (VITAMIN D2) 50000 units capsule Take 50,000 Units by mouth every 30 (thirty) days.  Marland Kitchen. guaiFENesin (MUCINEX) 600 MG 12 hr tablet Take 600 mg by mouth every 12 (twelve) hours as needed (congestion).  . hydrocortisone (ANUSOL-HC) 2.5 % rectal cream Place 1 application rectally 2 (two) times daily as needed (itching, hemorrhoids.).   Marland Kitchen. Hypromellose (ARTIFICIAL TEARS) 0.4 %  SOLN Apply 1 drop to eye 4 (four) times daily.  Marland Kitchen. ipratropium-albuterol (DUONEB) 0.5-2.5 (3) MG/3ML SOLN Take 3 mLs by nebulization 3 (three) times daily. For PNA.  Marland Kitchen. levothyroxine (SYNTHROID, LEVOTHROID) 25 MCG tablet Take 12.5 mcg by mouth daily before breakfast.  . magnesium hydroxide (MILK OF MAGNESIA) 400 MG/5ML suspension Take 30 mLs by mouth. Every 72 hours as needed  . MELATONIN PO Take 6 mg by mouth at bedtime.  . Multiple Vitamin (MULTIVITAMIN WITH MINERALS) TABS tablet Take 1 tablet by mouth daily.  Sherren Mocha. Ostomy Supplies (SKIN PREP WIPES) MISC 1 application by Does not apply route daily. Apply topically to R and L heel every day shift for preventative. Ensure feet are floating with pillow.  Marland Kitchen. oxyCODONE (ROXICODONE) 5 MG immediate release tablet Take one-half (1/2) tablet by mouth every 4 hours as needed for pain  . OxyCODONE HCl, Abuse Deter, (OXAYDO) 5 MG TABA Take 2.5 mg by mouth every 4 (four) hours as needed.  . OXYGEN Inhale 2 L/min into the lungs as needed (low o2 levels).  . prednisoLONE acetate (PRED FORTE) 1 % ophthalmic suspension Place 1 drop into the left eye 2 (two) times daily.   . QUEtiapine (SEROQUEL) 100 MG tablet Take 100 mg by mouth at bedtime. Give with 75 mg to equal total dose of 175 mg.  . QUEtiapine (SEROQUEL) 25 MG tablet Take 75 mg by mouth at bedtime. Give at bedtime with 100 mg quetiapine  to equal 175 mg.  . ranitidine (ZANTAC) 300 MG tablet Take 300 mg by mouth at bedtime.  . vitamin B-12 (CYANOCOBALAMIN) 1000 MCG tablet Take 1,000 mcg by mouth daily.   No facility-administered encounter medications on file as of 03/12/2017.     Allergies (verified) Ciprofloxacin; Effexor [venlafaxine]; Enablex [darifenacin hydrobromide er]; Macrobid [nitrofurantoin monohyd macro]; Metronidazole and related; Nsaids; Reclast [zoledronic acid]; and Sulfa antibiotics   History: Past Medical History:  Diagnosis Date  . Cataract   . Depression   . Diverticulosis   . GERD  (gastroesophageal reflux disease)   . Hyperlipidemia   . Hypertension   . Osteoporosis   . Stasis dermatitis   . Thyroid disease    hypothyroid   Past Surgical History:  Procedure Laterality Date  . ABDOMINAL HYSTERECTOMY    . APPENDECTOMY    . BREAST SURGERY     biopsy  . CATARACT EXTRACTION    . CHOLECYSTECTOMY    . TONSILECTOMY/ADENOIDECTOMY WITH MYRINGOTOMY    . TUBAL LIGATION    . vertebrolplasty     Family History  Problem Relation Age of Onset  . Aortic aneurysm Mother   . Cancer Father        mouth  . Diabetes Neg Hx   . Coronary artery disease Neg Hx    Social History   Occupational History  . Not on file.   Social History Main Topics  . Smoking status: Former Smoker    Packs/day: 1.00  . Smokeless tobacco: Never Used  . Alcohol use No  . Drug use: Unknown  . Sexual activity: Not on file    Tobacco Counseling Counseling given: Not Answered   Activities of Daily Living In your present state of health, do you have any difficulty performing the following activities: 03/12/2017 10/10/2016  Hearing? N Y  Vision? Y Y  Difficulty concentrating or making decisions? Malvin Johns  Walking or climbing stairs? Y Y  Dressing or bathing? Y Y  Doing errands, shopping? Malvin Johns  Preparing Food and eating ? Y -  Using the Toilet? Y -  In the past six months, have you accidently leaked urine? Y -  Do you have problems with loss of bowel control? Y -  Managing your Medications? Y -  Managing your Finances? Y -  Housekeeping or managing your Housekeeping? Y -  Some recent data might be hidden    Immunizations and Health Maintenance Immunization History  Administered Date(s) Administered  . Influenza-Unspecified 07/26/2016  . PPD Test 08/10/2015   Health Maintenance Due  Topic Date Due  . TETANUS/TDAP  03/23/1948  . DEXA SCAN  03/23/1996  . PNA vac Low Risk Adult (1 of 2 - PCV13) 03/23/1996    Patient Care Team: Kirt Boys, DO as PCP - [facility]Internal  Med[facility]) [facility]  Indicate any recent Medical Services you may have received from other than [facility] providers in the past year (date may be approximate).     Assessment:   This is a routine wellness examination for [place].   Hearing/Vision screen No exam data present  Dietary issues and exercise activities discussed: Current Exercise Habits: The patient does not participate in regular exercise at present, Exercise limited by: orthopedic condition(s)  Goals    . Maintain Lifestyle          Pt will maintain lifestyle.       Depression Screen PHQ 2/9 Scores 03/12/2017  PHQ - 2 Score 1  Fall Risk Fall Risk  03/12/2017  Falls in the past year? No    Cognitive Function:     6CIT Screen 03/12/2017  What Year? 4 points  What month? 3 points  What time? 0 points  Count back from 20 0 points  Months in reverse 4 points  Repeat phrase 10 points  Total Score 21    Screening Tests Health Maintenance  Topic Date Due  . TETANUS/TDAP  03[birth year]23[birth year]1950  . DEXA SCAN  03/23/1996  . PNA vac Low Risk Adult (1 of 2 - PCV13) 03/23/1996  . INFLUENZA VACCINE  05/01/2017      Plan:    I have personally reviewed and addressed the Medicare Annual Wellness questionnaire and have noted the following in the patient's chart:  A. Medical and social history B. Use of alcohol, tobacco or illicit drugs  C. Current medications and supplements D. Functional ability and status E.  Nutritional status F.  Physical activity G. Advance directives H. List of other physicians I.  Hospitalizations, surgeries, and ER visits in previous 12 months J.  Vitals K. Screenings to include hearing, vision, cognitive, depression L. Referrals and appointments - none  In addition, I have reviewed and discussed with patient certain preventive protocols, quality metrics, and best practice recommendations. A written personalized care plan for preventive services as well  as general preventive health recommendations were provided to patient.  See attached scanned questionnaire for additional information.   Signed,   Annetta Maw, RN Nurse Health Advisor   Quick Notes   Health Maintenance: DEXA and PNA 13 due     Abnormal Screen: 6 Cit:21     Patient Concerns: Rash on bilateral palms     Nurse Concerns: None

## 2017-03-19 IMAGING — CR DG CHEST 2V
1 series · 1 of 1 positions shown · non-contrast
Comparison: 08/06/2015

CLINICAL DATA: Cough, fever

EXAM:
CHEST  2 VIEW

[w chest lat]
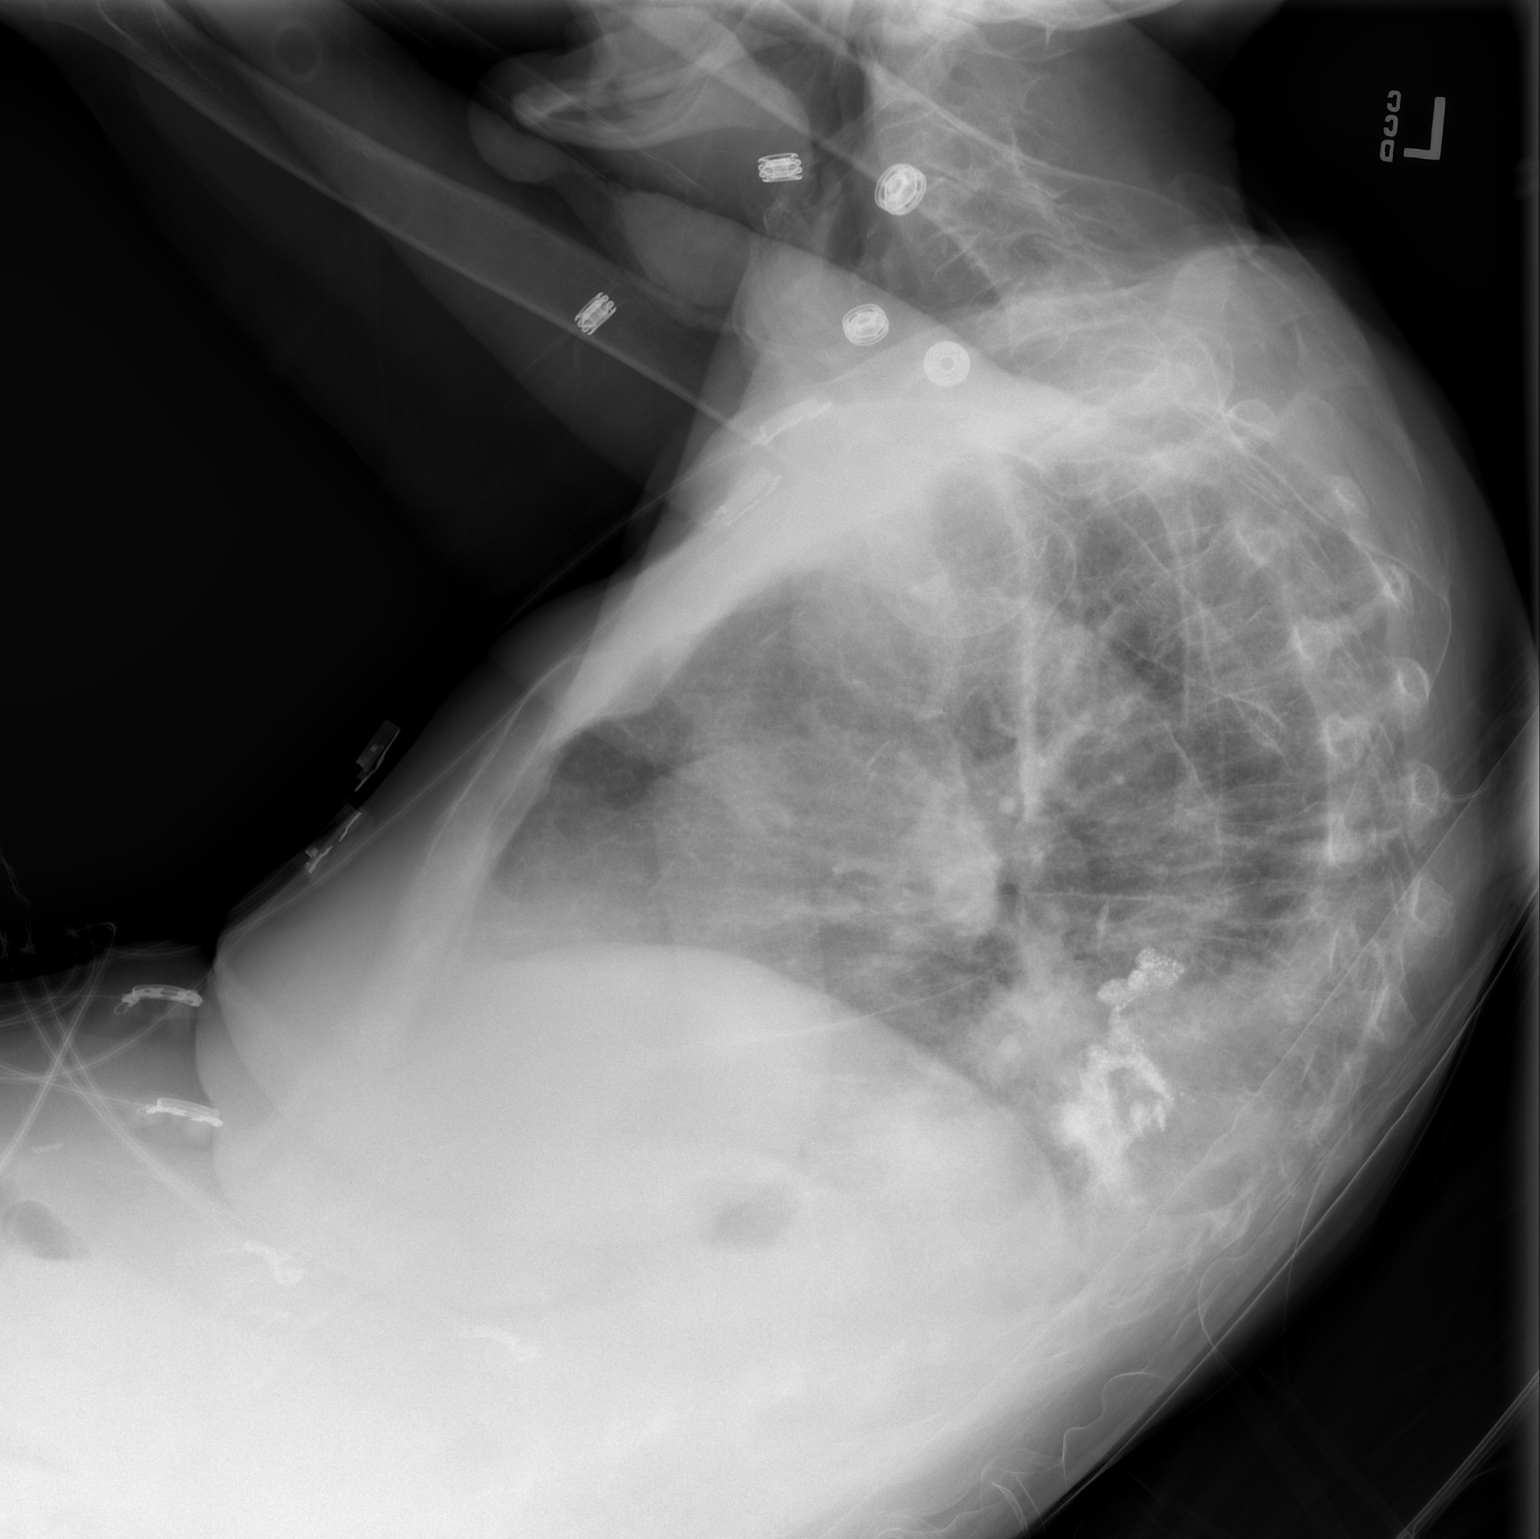

[1 of 1 positions shown; findings below may reference images not displayed]

FINDINGS: Consolidation noted in the left lower lobe compatible with
pneumonia. No confluent opacity on the right. Heart is normal size.
No visible effusions or acute bony abnormality. Changes of prior
multi level vertebroplasty in the mid to lower thoracic spine.
IMPRESSION: Consolidation in the left lower lobe compatible with pneumonia.
Followup PA and lateral chest X-ray is recommended in 3-4 weeks
following trial of antibiotic therapy to ensure resolution and
exclude underlying malignancy.

## 2017-03-19 IMAGING — CR DG FOOT COMPLETE 3+V*R*
3 series · 3 of 3 positions shown · non-contrast
Comparison: None.

CLINICAL DATA: Onset right foot pain today. No known injury.
Initial encounter.

EXAM:
RIGHT FOOT COMPLETE - 3+ VIEW

[x foot lat right (1 of 3)]
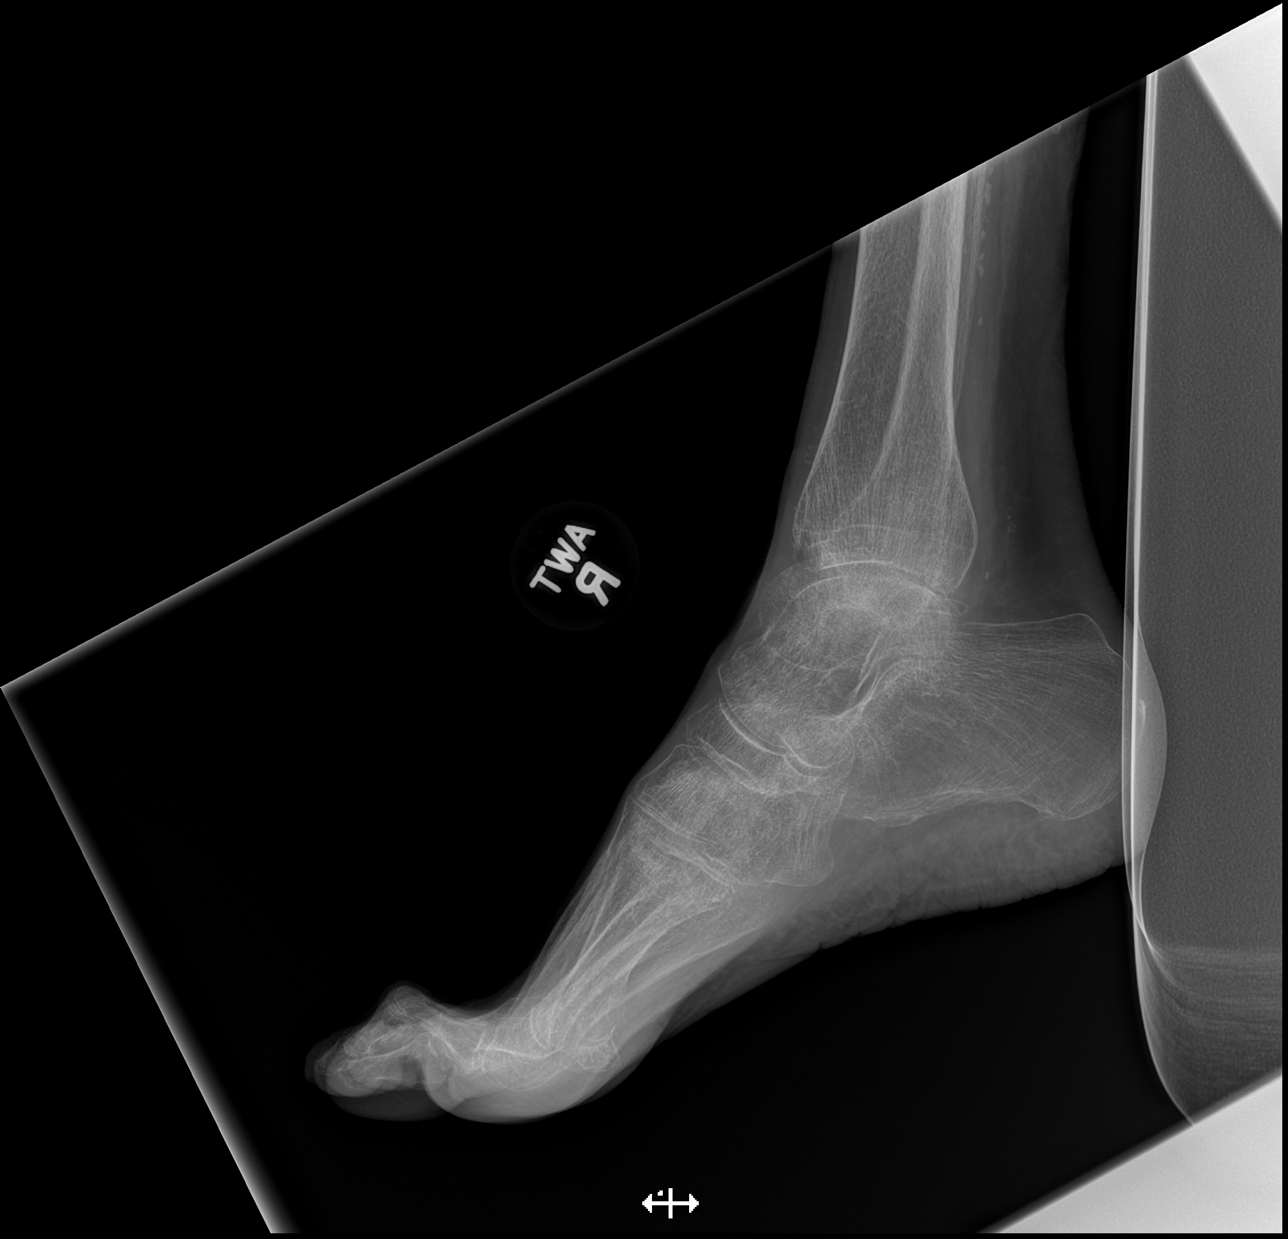

[x foot lat right (2 of 3)]
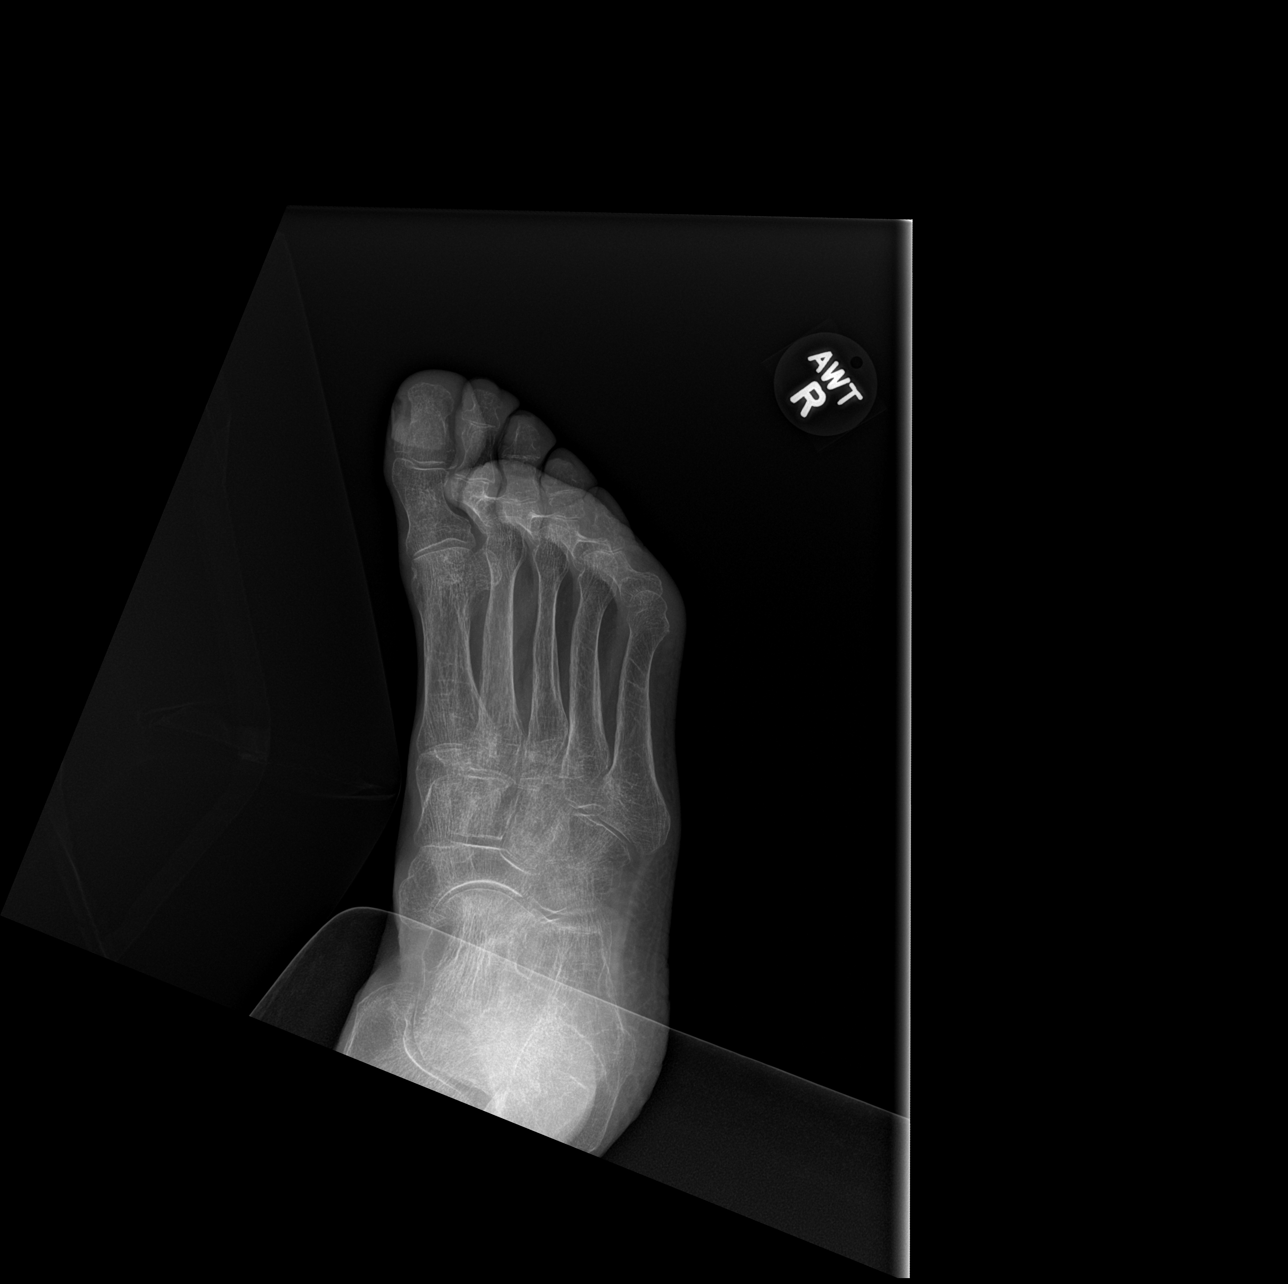

[x foot lat right (3 of 3)]
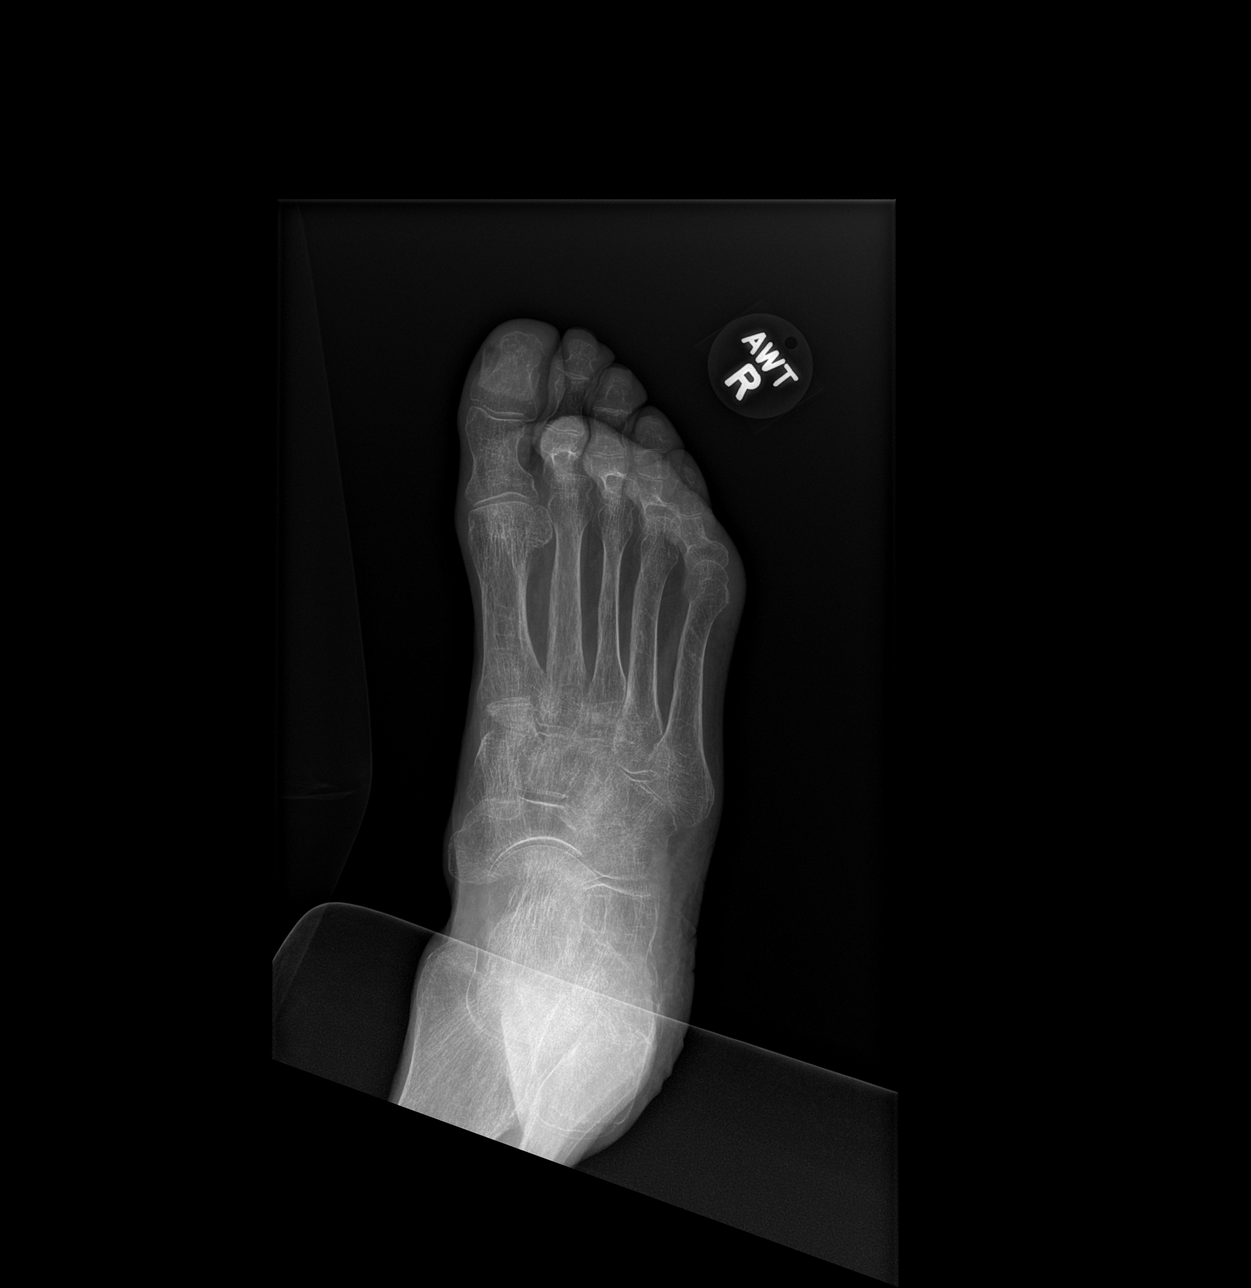

[3 of 3 positions shown; findings below may reference images not displayed]

FINDINGS: No acute bony or joint abnormality is identified. No notable
arthropathy. Bones are markedly osteopenic.
IMPRESSION: No acute abnormality.

Osteopenia.

## 2017-05-30 ENCOUNTER — Encounter (INDEPENDENT_AMBULATORY_CARE_PROVIDER_SITE_OTHER): Payer: Self-pay

## 2017-05-30 ENCOUNTER — Ambulatory Visit (INDEPENDENT_AMBULATORY_CARE_PROVIDER_SITE_OTHER): Payer: Medicaid Other | Admitting: Gastroenterology

## 2017-05-30 ENCOUNTER — Encounter: Payer: Self-pay | Admitting: Gastroenterology

## 2017-05-30 VITALS — BP 140/80 | HR 79

## 2017-05-30 DIAGNOSIS — R195 Other fecal abnormalities: Secondary | ICD-10-CM | POA: Diagnosis not present

## 2017-05-30 DIAGNOSIS — K623 Rectal prolapse: Secondary | ICD-10-CM | POA: Diagnosis not present

## 2017-05-30 DIAGNOSIS — K648 Other hemorrhoids: Secondary | ICD-10-CM

## 2017-05-30 NOTE — Progress Notes (Signed)
[facility] Consult Note:  History: Ashley Swanson 05/30/2017  Referring physician: [facility]  Reason for consult/chief complaint: Heme positive stool, according to consult request.  Subjective  HPI:  This is an 83 year old woman sent by a local nursing facility with reported heme positive stool. The patient can give no history or review of systems due to her advanced dementia. As near as I can tell, she denies abdominal pain. She was left in a wheelchair in our waiting room by the staff at the nursing facility, and it took 3 of our office staff 20 minutes assisting this patient with her need to use the bathroom. She apparently had passage of a copious amount of soft stool, and was observed by our staff to have had a protrusion of rectal tissue which spontaneously reduced. After considerably further efforts, we got her into exam room and onto the examination table.   ROS:  Review of Systems Unable to obtain due to patient's advanced dementia  Past Medical History: Past Medical History:  Diagnosis Date  . Cataract   . Depression   . Diverticulosis   . GERD (gastroesophageal reflux disease)   . Hyperlipidemia   . Hypertension   . Osteoporosis   . Stasis dermatitis   . Thyroid disease    hypothyroid     Past Surgical History: Past Surgical History:  Procedure Laterality Date  . ABDOMINAL HYSTERECTOMY    . APPENDECTOMY    . BREAST SURGERY     biopsy  . CATARACT EXTRACTION    . CHOLECYSTECTOMY    . TONSILECTOMY/ADENOIDECTOMY WITH MYRINGOTOMY    . TUBAL LIGATION    . vertebrolplasty       Family History: Family History  Problem Relation Age of Onset  . Aortic aneurysm Mother   . Cancer Father        mouth  . Diabetes Neg Hx   . Coronary artery disease Neg Hx     Social History: Social History   Social History  . Marital status: Widowed    Spouse name: N/A  . Number of children: N/A  .  Years of education: N/A   Social History Main Topics  . Smoking status: Former Smoker    Packs/day: 1.00  . Smokeless tobacco: Never Used  . Alcohol use No  . Drug use: Unknown  . Sexual activity: Not Asked   Other Topics Concern  . None   Social History Narrative  . None    Allergies: Allergies  Allergen Reactions  . Ciprofloxacin     unknown  . Effexor [Venlafaxine]     Intolerance  . Enablex [Darifenacin Hydrobromide Er]     unknown  . Macrobid [employer][Nitrofurantoin Monohyd Macro]     unknown  . Metronidazole And Related     unknown  . Nsaids     unknown  . Reclast [Zoledronic Acid]     unknown  . Sulfa Antibiotics     unknown    Outpatient Meds: Current Outpatient Prescriptions  Medication Sig Dispense Refill  . acetaminophen (TYLENOL) 325 MG tablet Take 650 mg by mouth every 8 (eight) hours.    Marland Kitchen. aluminum-magnesium hydroxide-simethicone (MAALOX) 200-200-20 MG/5ML SUSP Take 15 mLs by mouth every 4 (four) hours as needed (reflux).    . benzocaine (ANBESOL) 10 % mucosal gel Use as directed 1 application in the mouth or throat every 8 (eight) hours as needed for mouth pain.     . benzonatate (TESSALON) 100  MG capsule Take 100 mg by mouth 3 (three) times daily as needed for cough.    . bisacodyl (DULCOLAX) 10 MG suppository Place 10 mg rectally as needed for moderate constipation.    . brimonidine (ALPHAGAN) 0.2 % ophthalmic solution Place 1 drop into the right eye 3 (three) times daily.    . calcium carbonate (OS-CAL - DOSED IN MG OF ELEMENTAL CALCIUM) 1250 (500 CA) MG tablet Take 1 tablet by mouth daily with breakfast.    . citalopram (CELEXA) 20 MG tablet Take 20 mg by mouth daily.     Marland Kitchen docusate sodium (COLACE) 100 MG capsule Take 200 mg by mouth every morning.    . dorzolamide-timolol (COSOPT) 22.3-6.8 MG/ML ophthalmic solution Place 1 drop into both eyes 2 (two) times daily.    . ergocalciferol (VITAMIN D2) 50000 units capsule Take 50,000 Units by mouth every 30  (thirty) days.    Marland Kitchen guaiFENesin (MUCINEX) 600 MG 12 hr tablet Take 600 mg by mouth every 12 (twelve) hours as needed (congestion).    . hydrocortisone (ANUSOL-HC) 2.5 % rectal cream Place 1 application rectally 2 (two) times daily as needed (itching, hemorrhoids.).     Marland Kitchen Hypromellose (ARTIFICIAL TEARS) 0.4 % SOLN Apply 1 drop to eye 4 (four) times daily.    Marland Kitchen ipratropium-albuterol (DUONEB) 0.5-2.5 (3) MG/3ML SOLN Take 3 mLs by nebulization 3 (three) times daily. For PNA.    Marland Kitchen levothyroxine (SYNTHROID, LEVOTHROID) 25 MCG tablet Take 12.5 mcg by mouth daily before breakfast.    . magnesium hydroxide (MILK OF MAGNESIA) 400 MG/5ML suspension Take 30 mLs by mouth. Every 72 hours as needed    . MELATONIN PO Take 6 mg by mouth at bedtime.    . Multiple Vitamin (MULTIVITAMIN WITH MINERALS) TABS tablet Take 1 tablet by mouth daily.    Sherren Mocha Supplies (SKIN PREP WIPES) MISC 1 application by Does not apply route daily. Apply topically to R and L heel every day shift for preventative. Ensure feet are floating with pillow.    Marland Kitchen oxyCODONE (ROXICODONE) 5 MG immediate release tablet Take one-half (1/2) tablet by mouth every 4 hours as needed for pain 90 tablet 0  . OxyCODONE HCl, Abuse Deter, (OXAYDO) 5 MG TABA Take 2.5 mg by mouth every 4 (four) hours as needed.    . OXYGEN Inhale 2 L/min into the lungs as needed (low o2 levels).    . prednisoLONE acetate (PRED FORTE) 1 % ophthalmic suspension Place 1 drop into the left eye 2 (two) times daily.     . QUEtiapine (SEROQUEL) 100 MG tablet Take 100 mg by mouth at bedtime. Give with 75 mg to equal total dose of 175 mg.    . QUEtiapine (SEROQUEL) 25 MG tablet Take 75 mg by mouth at bedtime. Give at bedtime with 100 mg quetiapine to equal 175 mg.    . ranitidine (ZANTAC) 300 MG tablet Take 300 mg by mouth at bedtime.    . vitamin B-12 (CYANOCOBALAMIN) 1000 MCG tablet Take 1,000 mcg by mouth daily.     No current facility-administered medications for this visit.        ___________________________________________________________________ Objective   Exam:  BP 140/80   Pulse 79  This patient is febrile and cannot walk on her own power.  Poor muscle mass  Eyes: sclera anicteric, no redness  CV: RRR without murmur, S1/S2, no JVD, no peripheral edema  Resp: clear to auscultation bilaterally, normal RR and effort noted  GI: soft, no tenderness, with  active bowel sounds. No guarding or palpable organomegaly noted.  Skin; warm and dry, no rash or jaundice noted  Neuro: She cannot participate no neurologic exam. She is globally weak Medical assistance Sheralyn Boatman and Verlon Au present for entire encounter. Rectal exam: Normal external exam, decreased resting sphincter tone. No mass or other palpable abnormality. Copious soft stool in the rectal vault Anoscopy: Internal hemorrhoids. Rectal prolapse is reproduced with Valsalva.   No data available for review  Assessment: Encounter Diagnoses  Name Primary?  . Internal hemorrhoids Yes  . Rectal prolapse   . Heme positive stool    The circumstances of checking this patient's stool for occult blood are unclear to me. She has internal hemorrhoids and rectal prolapse on exam. Given her advanced age and debility, I would manage her conservatively with no further testing in no endoscopic workup. She does not appear to be troubled by constipation.  My impression and recommendation were conveyed to the nursing facility on their attached paperwork.  Total time 30 minutes, over half spent in record review with the difficult logistics of her examination   Charlie Pitter III

## 2017-05-30 NOTE — Patient Instructions (Signed)
If you are age 81 or older, your body mass index should be between 23-30. Your There is no height or weight on file to calculate BMI. If this is out of the aforementioned range listed, please consider follow up with your Primary Care Provider.  If you are age 64 or younger, your body mass index should be between 19-25. Your There is no height or weight on file to calculate BMI. If this is out of the aformentioned range listed, please consider follow up with your Primary Care Provider.   Thank you for choosing [facility]  Dr Henry Danis III  

## 2017-07-02 ENCOUNTER — Encounter (HOSPITAL_COMMUNITY): Payer: Self-pay | Admitting: Emergency Medicine

## 2017-07-02 ENCOUNTER — Emergency Department (HOSPITAL_COMMUNITY)
Admission: EM | Admit: 2017-07-02 | Discharge: 2017-07-02 | Disposition: A | Discharge status: Home / Self Care | Payer: Medicare Other | Attending: Emergency Medicine | Admitting: Emergency Medicine

## 2017-07-02 DIAGNOSIS — Y92122 Bedroom in nursing home as the place of occurrence of the external cause: Secondary | ICD-10-CM | POA: Insufficient documentation

## 2017-07-02 DIAGNOSIS — Z79899 Other long term (current) drug therapy: Secondary | ICD-10-CM | POA: Diagnosis not present

## 2017-07-02 DIAGNOSIS — W268XXA Contact with other sharp object(s), not elsewhere classified, initial encounter: Secondary | ICD-10-CM | POA: Insufficient documentation

## 2017-07-02 DIAGNOSIS — J449 Chronic obstructive pulmonary disease, unspecified: Secondary | ICD-10-CM | POA: Insufficient documentation

## 2017-07-02 DIAGNOSIS — Y999 Unspecified external cause status: Secondary | ICD-10-CM | POA: Diagnosis not present

## 2017-07-02 DIAGNOSIS — E039 Hypothyroidism, unspecified: Secondary | ICD-10-CM | POA: Insufficient documentation

## 2017-07-02 DIAGNOSIS — Z87891 Personal history of nicotine dependence: Secondary | ICD-10-CM | POA: Insufficient documentation

## 2017-07-02 DIAGNOSIS — I10 Essential (primary) hypertension: Secondary | ICD-10-CM | POA: Diagnosis not present

## 2017-07-02 DIAGNOSIS — Y939 Activity, unspecified: Secondary | ICD-10-CM | POA: Insufficient documentation

## 2017-07-02 DIAGNOSIS — S81811A Laceration without foreign body, right lower leg, initial encounter: Secondary | ICD-10-CM | POA: Diagnosis not present

## 2017-07-02 MED ORDER — LIDOCAINE-EPINEPHRINE (PF) 2 %-1:200000 IJ SOLN
20.0000 mL | Freq: Once | INTRAMUSCULAR | Status: AC
  Administered 2017-07-02: 20 mL
  Filled 2017-07-02: qty 20

## 2017-07-02 MED ORDER — HYDROCODONE-ACETAMINOPHEN 5-325 MG PO TABS
1.0000 | ORAL_TABLET | Freq: Once | ORAL | Status: AC
  Administered 2017-07-02: 1 via ORAL
  Filled 2017-07-02: qty 1

## 2017-07-02 NOTE — ED Provider Notes (Signed)
[facility]-EMERGENCY DEPT Provider Note   CSN: [id number] Arrival date & time: 07/02/17  1754     History   Chief Complaint Chief Complaint  Patient presents with  . Laceration    HPI Ashley Swanson is a 83 y.o. female.  HPI Patient presents to the emergency department the laceration of her right lower leg obtained via the bed rail at her nursing home.  Since the ER by staff.  No active bleeding at this time.  Patient has a history of oxygen dependent COPD.  No chest pain shortness breath.  Denies abdominal pain.  No recent illness.  Requesting laceration repair    Past Medical History:  Diagnosis Date  . Cataract   . Depression   . Diverticulosis   . GERD (gastroesophageal reflux disease)   . Hyperlipidemia   . Hypertension   . Osteoporosis   . Stasis dermatitis   . Thyroid disease    hypothyroid    Patient Active Problem List   Diagnosis Date Noted  . At risk for adverse drug event 01/09/2017  . Nondisp supracondyl fx of right distal femur w/o intracondyl extension (HCC) 10/15/2016  . Vascular dementia without behavioral disturbance 10/15/2016  . Pressure injury of skin 10/11/2016  . HCAP (healthcare-associated pneumonia) 10/10/2016  . Acute on chronic respiratory failure with hypoxia (HCC) 10/10/2016  . Loss of weight 08/10/2016  . Paranoid schizophrenia, chronic condition (HCC) 02/23/2016  . Chronic respiratory failure with hypoxia (HCC) 02/23/2016  . Dysphagia, pharyngoesophageal phase 11/21/2015  . Depression, major, recurrent, severe with psychosis (HCC) 10/14/2015  . Supplemental oxygen dependent 08/12/2015  . Chronic constipation 08/12/2015  . Glaucoma 08/12/2015  . Hypothyroidism 08/07/2015  . Thoracic compression fracture (HCC) 08/06/2015  . GERD (gastroesophageal reflux disease)   . Essential hypertension     Past Surgical History:  Procedure Laterality Date  . ABDOMINAL HYSTERECTOMY    . APPENDECTOMY    . BREAST SURGERY     biopsy  . CATARACT  EXTRACTION    . CHOLECYSTECTOMY    . TONSILECTOMY/ADENOIDECTOMY WITH MYRINGOTOMY    . TUBAL LIGATION    . vertebrolplasty      OB History    No data available       Home Medications    Prior to Admission medications   Medication Sig Start Date End Date Taking? Authorizing Provider  acetaminophen (TYLENOL) 325 MG tablet Take 650 mg by mouth every 8 (eight) hours as needed for mild pain.    Yes [provider]  aluminum-magnesium hydroxide-simethicone (MAALOX) 200-200-20 MG/5ML SUSP Take 15 mLs by mouth every 4 (four) hours as needed (reflux).   Yes [provider]  benzocaine (ANBESOL) 10 % mucosal gel Use as directed 1 application in the mouth or throat every 8 (eight) hours as needed for mouth pain.    Yes [provider]  benzonatate (TESSALON) 100 MG capsule Take 100 mg by mouth every 8 (eight) hours as needed for cough.    Yes [provider]  bisacodyl (DULCOLAX) 10 MG suppository Place 10 mg rectally as needed for moderate constipation.   Yes [provider]  brimonidine (ALPHAGAN) 0.2 % ophthalmic solution Place 1 drop into the right eye 3 (three) times daily.   Yes [provider]  calcium carbonate (OS-CAL - DOSED IN MG OF ELEMENTAL CALCIUM) 1250 (500 CA) MG tablet Take 1 tablet by mouth daily with breakfast.   Yes [provider]  Cholecalciferol 50000 units TABS Take 1 tablet by mouth daily.  Yes [provider]  citalopram (CELEXA) 20 MG tablet Take 20 mg by mouth daily.    Yes [provider]  clonazePAM (KLONOPIN) 0.5 MG tablet Take 0.5 mg by mouth daily. Related to anxiety   Yes [provider]  docusate sodium (COLACE) 100 MG capsule Take 200 mg by mouth every morning.   Yes [provider]  dorzolamide-timolol (COSOPT) 22.3-6.8 MG/ML ophthalmic solution Place 1 drop into both eyes 2 (two) times daily.   Yes [provider]  Emollient (EUCERIN) lotion Apply 1  Bottle topically as needed for dry skin (after bath).   Yes [provider]  gabapentin (NEURONTIN) 100 MG capsule Take 100 mg by mouth at bedtime. For radicular leg pain   Yes [provider]  guaiFENesin (MUCINEX) 600 MG 12 hr tablet Take 600 mg by mouth every 12 (twelve) hours as needed (congestion).   Yes [provider]  hydrocortisone (ANUSOL-HC) 2.5 % rectal cream Place 1 application rectally 2 (two) times daily as needed (itching, hemorrhoids.).    Yes [provider]  hydrocortisone cream 1 % Apply 1 application topically 2 (two) times daily as needed for itching (pruitis).   Yes [provider]  hydrOXYzine (ATARAX/VISTARIL) 25 MG tablet Take 25 mg by mouth 2 (two) times daily as needed for itching.   Yes [provider]  Hypromellose (ARTIFICIAL TEARS) 0.4 % SOLN Apply 1 drop to eye 4 (four) times daily.   Yes [provider]  levothyroxine (SYNTHROID, LEVOTHROID) 25 MCG tablet Take 12.5 mcg by mouth daily before breakfast.   Yes [provider]  magnesium hydroxide (MILK OF MAGNESIA) 400 MG/5ML suspension Take 30 mLs by mouth. Every 73 hours as needed   Yes [provider]  MELATONIN PO Take 6 mg by mouth at bedtime. For insomnia   Yes [provider]  Multiple Vitamin (MULTIVITAMIN WITH MINERALS) TABS tablet Take 1 tablet by mouth daily.   Yes [provider]  omeprazole (PRILOSEC) 20 MG capsule Take 20 mg by mouth every morning. For dysphagia   Yes [provider]  prednisoLONE acetate (PRED FORTE) 1 % ophthalmic suspension Place 1 drop into the left eye 2 (two) times daily.    Yes [provider]  QUEtiapine (SEROQUEL) 100 MG tablet Take 100 mg by mouth at bedtime. Give with 75 mg to equal total dose of 175 mg.   Yes [provider]  QUEtiapine (SEROQUEL) 25 MG tablet Take 75 mg by mouth at bedtime. Give at bedtime with 100 mg quetiapine to equal 175 mg.   Yes  [provider]  ranitidine (ZANTAC) 300 MG tablet Take 300 mg by mouth at bedtime.   Yes [provider]  vitamin B-12 (CYANOCOBALAMIN) 1000 MCG tablet Take 1,000 mcg by mouth daily.   Yes [provider]  ergocalciferol (VITAMIN D2) 50000 units capsule Take 50,000 Units by mouth every 30 (thirty) days.    [provider]  ipratropium-albuterol (DUONEB) 0.5-2.5 (3) MG/3ML SOLN Take 3 mLs by nebulization 3 (three) times daily. For PNA.    [provider]  Ostomy Supplies (SKIN PREP WIPES) MISC 1 application by Does not apply route daily. Apply topically to R and L heel every day shift for preventative. Ensure feet are floating with pillow.    [provider]  oxyCODONE (ROXICODONE) 5 MG immediate release tablet Take one-half (1/2) tablet by mouth every 4 hours as needed for pain Patient not taking: Reported on 07/02/2017 01/15/17  Sharon Seller, NP  OxyCODONE HCl, Abuse Deter, (OXAYDO) 5 MG TABA Take 2.5 mg by mouth every 4 (four) hours as needed.    [provider]  OXYGEN Inhale 2 L/min into the lungs as needed (low o2 levels).    [provider]    Family History Family History  Problem Relation Age of Onset  . Aortic aneurysm Mother   . Cancer Father        mouth  . Diabetes Neg Hx   . Coronary artery disease Neg Hx     Social History Social History  Substance Use Topics  . Smoking status: Former Smoker    Packs/day: 1.00  . Smokeless tobacco: Never Used  . Alcohol use No     Allergies   Ciprofloxacin; Effexor [venlafaxine]; Enablex [darifenacin hydrobromide er]; Macrobid [nitrofurantoin monohyd macro]; Metronidazole and related; Nsaids; Reclast [zoledronic acid]; and Sulfa antibiotics   Review of Systems Review of Systems  All other systems reviewed and are negative.    Physical Exam Updated Vital Signs Pulse 86   Temp 98.4 F (36.9 C) (Oral)   SpO2 98%   Physical Exam  Constitutional:  She is oriented to person, place, and time. She appears well-developed and well-nourished.  HENT:  Head: Normocephalic.  Eyes: EOM are normal.  Neck: Normal range of motion.  Pulmonary/Chest: Effort normal.  Abdominal: She exhibits no distension.  Musculoskeletal:  12 cm laceration of the right lower lateral leg without active bleeding.  Normal pulses in right foot.  Full range of motion of right knee and right hip as well as right ankle.  Compartments of the right lower extremity are soft  Neurological: She is alert and oriented to person, place, and time.  Psychiatric: She has a normal mood and affect.  Nursing note and vitals reviewed.    ED Treatments / Results  Labs (all labs ordered are listed, but only abnormal results are displayed) Labs Reviewed - No data to display  EKG  EKG Interpretation None       Radiology No results found.  Procedures .Marland KitchenLaceration Repair Performed by: Azalia Bilis Authorized by: Azalia Bilis     Consent: Verbal consent obtained. Risks and benefits: risks, benefits and alternatives were discussed Patient identity confirmed: provided demographic data Time out performed prior to procedure Prepped and Draped in normal sterile fashion Wound explored Laceration Location: right lower leg Laceration Length: 12cm No Foreign Bodies seen or palpated Anesthesia: local infiltration Local anesthetic: lidocaine 2% with epinephrine Anesthetic total: 12 ml Irrigation method: syringe Amount of cleaning: standard Skin closure: 3-0 prolene Number of sutures or staples: 17 Technique: running interlocked with dermabond Patient tolerance: Patient tolerated the procedure well with no immediate complications.   Medications Ordered in ED Medications  HYDROcodone-acetaminophen (NORCO/VICODIN) 5-325 MG per tablet 1 tablet (1 tablet Oral Given 07/02/17 1809)  lidocaine-EPINEPHrine (XYLOCAINE W/EPI) 2 %-1:200000 (PF) injection 20 mL (20 mLs Infiltration  Given by Other 07/02/17 1812)     Initial Impression / Assessment and Plan / ED Course  I have reviewed the triage vital signs and the nursing notes.  Pertinent labs & imaging results that were available during my care of the patient were reviewed by me and considered in my medical decision making (see chart for details).     Laceration repaired. Infection warnings given. No other injury  Suture removal in 12 days  Final Clinical Impressions(s) / ED Diagnoses   Final diagnoses:  Laceration of right lower leg, initial encounter  New Prescriptions New Prescriptions   No medications on file     Azalia Bilis, MD 07/02/17 2023

## 2017-07-02 NOTE — ED Notes (Signed)
Bed: ZO10 Expected date:  Expected time:  Means of arrival:  Comments: EMS-skin tear

## 2017-07-02 NOTE — ED Triage Notes (Addendum)
Per [facility] pt from [facility], staff reports while attempting to get patient moved her skin caught on bed rail. Right leg laceration aprox 2 inches in length.   EMS adds pt was found not on oxygen when she reports she usually is for her COPD. Pt was sating 79%, placed on 4L [place] and now 96%.

## 2017-07-02 NOTE — Discharge Instructions (Signed)
Suture removal in 12 days

## 2017-07-02 NOTE — ED Notes (Signed)
[facility] called for transport to [facility]

## 2017-08-01 DEATH — deceased
